# Patient Record
Sex: Female | Born: 1960 | Race: White | Hispanic: No | Marital: Married | Smoking: Former smoker
Health system: Southern US, Community
[De-identification: ages and names within clinical notes are randomized; demographics above are authoritative.]

## PROBLEM LIST (undated history)

## (undated) DIAGNOSIS — I10 Essential (primary) hypertension: Secondary | ICD-10-CM

## (undated) DIAGNOSIS — M4800 Spinal stenosis, site unspecified: Secondary | ICD-10-CM

## (undated) DIAGNOSIS — Z972 Presence of dental prosthetic device (complete) (partial): Secondary | ICD-10-CM

## (undated) DIAGNOSIS — M549 Dorsalgia, unspecified: Secondary | ICD-10-CM

## (undated) DIAGNOSIS — E039 Hypothyroidism, unspecified: Secondary | ICD-10-CM

## (undated) DIAGNOSIS — L409 Psoriasis, unspecified: Secondary | ICD-10-CM

## (undated) DIAGNOSIS — Z8489 Family history of other specified conditions: Secondary | ICD-10-CM

## (undated) DIAGNOSIS — E079 Disorder of thyroid, unspecified: Secondary | ICD-10-CM

## (undated) HISTORY — PX: VASCULAR SURGERY: SHX849

## (undated) HISTORY — DX: Dorsalgia, unspecified: M54.9

## (undated) HISTORY — DX: Disorder of thyroid, unspecified: E07.9

## (undated) HISTORY — DX: Essential (primary) hypertension: I10

## (undated) HISTORY — DX: Psoriasis, unspecified: L40.9

## (undated) HISTORY — DX: Spinal stenosis, site unspecified: M48.00

---

## 2004-08-31 ENCOUNTER — Ambulatory Visit: Payer: Self-pay | Admitting: Obstetrics and Gynecology

## 2005-05-15 HISTORY — PX: ABDOMINAL HYSTERECTOMY: SHX81

## 2005-09-28 ENCOUNTER — Ambulatory Visit: Payer: Self-pay | Admitting: Obstetrics and Gynecology

## 2006-03-26 ENCOUNTER — Ambulatory Visit: Payer: Self-pay | Admitting: Obstetrics and Gynecology

## 2006-10-02 ENCOUNTER — Ambulatory Visit: Payer: Self-pay | Admitting: Obstetrics and Gynecology

## 2006-10-15 ENCOUNTER — Ambulatory Visit: Payer: Self-pay | Admitting: Obstetrics and Gynecology

## 2007-10-02 ENCOUNTER — Ambulatory Visit: Payer: Self-pay | Admitting: Obstetrics and Gynecology

## 2007-12-02 ENCOUNTER — Ambulatory Visit: Payer: Self-pay | Admitting: Gastroenterology

## 2008-10-20 ENCOUNTER — Ambulatory Visit: Payer: Self-pay

## 2009-03-31 DIAGNOSIS — D229 Melanocytic nevi, unspecified: Secondary | ICD-10-CM

## 2009-03-31 HISTORY — DX: Melanocytic nevi, unspecified: D22.9

## 2010-07-23 LAB — HM PAP SMEAR: HM Pap smear: NORMAL

## 2011-02-15 ENCOUNTER — Ambulatory Visit: Payer: Self-pay | Admitting: Family Medicine

## 2011-02-21 ENCOUNTER — Ambulatory Visit: Payer: Self-pay | Admitting: Family Medicine

## 2012-02-20 ENCOUNTER — Ambulatory Visit: Payer: Self-pay | Admitting: Family Medicine

## 2012-03-18 ENCOUNTER — Encounter: Payer: 59 | Admitting: Family Medicine

## 2012-07-22 ENCOUNTER — Encounter: Payer: Self-pay | Admitting: Internal Medicine

## 2012-07-22 ENCOUNTER — Ambulatory Visit (INDEPENDENT_AMBULATORY_CARE_PROVIDER_SITE_OTHER): Payer: 59 | Admitting: Internal Medicine

## 2012-07-22 VITALS — BP 120/80 | HR 87 | Temp 98.1°F | Ht 65.0 in | Wt 202.0 lb

## 2012-07-22 DIAGNOSIS — Z6831 Body mass index (BMI) 31.0-31.9, adult: Secondary | ICD-10-CM | POA: Insufficient documentation

## 2012-07-22 DIAGNOSIS — E669 Obesity, unspecified: Secondary | ICD-10-CM

## 2012-07-22 DIAGNOSIS — E039 Hypothyroidism, unspecified: Secondary | ICD-10-CM

## 2012-07-22 NOTE — Progress Notes (Signed)
  Subjective:    Patient ID: [name], female    DOB: [birth year], 52 y.o.   MRN: [id number]  HPI 52 year old female with history of hypothyroidism presents to establish care. She reports she is generally feeling well. She notes some difficulty losing weight. However, she notes dietary indiscretion and has not currently following a specific exercise plan. She reports that she works long hours, typically 12 hours per day and has limited time for exercise. She is planning to start a walking program during her lunchtime at work. She denies any new concerns today.  Outpatient Encounter Prescriptions as of 07/22/2012  Medication Sig Dispense Refill  . cholecalciferol (VITAMIN D) 1000 UNITS tablet Take 1,000 Units by mouth daily.      . fish oil-omega-3 fatty acids 1000 MG capsule Take 2 g by mouth daily.      [name] levothyroxine (SYNTHROID, LEVOTHROID) 50 MCG tablet Take 50 mcg by mouth daily.       No facility-administered encounter medications on file as of 07/22/2012.   BP 120/80  Pulse 87  Temp(Src) 98.1 F (36.7 C) (Oral)  Ht 5\' 5"  (1.651 m)  Wt 202 lb (91.627 kg)  BMI 33.61 kg/m2  SpO2 97%  Review of Systems  Constitutional: Negative for fever, chills, appetite change, fatigue and unexpected weight change.  HENT: Negative for ear pain, congestion, sore throat, trouble swallowing, neck pain, voice change and sinus pressure.   Eyes: Negative for visual disturbance.  Respiratory: Negative for cough, shortness of breath, wheezing and stridor.   Cardiovascular: Negative for chest pain, palpitations and leg swelling.  Gastrointestinal: Negative for nausea, vomiting, abdominal pain, diarrhea, constipation, blood in stool, abdominal distention and anal bleeding.  Genitourinary: Negative for dysuria and flank pain.  Musculoskeletal: Negative for myalgias, arthralgias and gait problem.  Skin: Negative for color change and rash.  Neurological: Negative for dizziness and headaches.   Hematological: Negative for adenopathy. Does not bruise/bleed easily.  Psychiatric/Behavioral: Negative for suicidal ideas, sleep disturbance and dysphoric mood. The patient is not nervous/anxious.        Objective:   Physical Exam  Constitutional: She is oriented to person, place, and time. She appears well-developed and well-nourished. No distress.  HENT:  Head: Normocephalic and atraumatic.  Right Ear: External ear normal.  Left Ear: External ear normal.  Nose: Nose normal.  Mouth/Throat: Oropharynx is clear and moist. No oropharyngeal exudate.  Eyes: Conjunctivae are normal. Pupils are equal, round, and reactive to light. Right eye exhibits no discharge. Left eye exhibits no discharge. No scleral icterus.  Neck: Normal range of motion. Neck supple. No tracheal deviation present. No thyromegaly present.  Cardiovascular: Normal rate, regular rhythm, normal heart sounds and intact distal pulses.  Exam reveals no gallop and no friction rub.   No murmur heard. Pulmonary/Chest: Effort normal and breath sounds normal. No respiratory distress. She has no wheezes. She has no rales. She exhibits no tenderness.  Musculoskeletal: Normal range of motion. She exhibits no edema and no tenderness.  Lymphadenopathy:    She has no cervical adenopathy.  Neurological: She is alert and oriented to person, place, and time. No cranial nerve deficit. She exhibits normal muscle tone. Coordination normal.  Skin: Skin is warm and dry. No rash noted. She is not diaphoretic. No erythema. No pallor.  Psychiatric: She has a normal mood and affect. Her behavior is normal. Judgment and thought content normal.          Assessment & Plan:

## 2012-07-22 NOTE — Assessment & Plan Note (Signed)
Will check TSH today at [facility]. Continue Levothyroxine. Follow up 03/2013 for physical exam.

## 2012-07-22 NOTE — Assessment & Plan Note (Signed)
Body mass index is 33.61 kg/(m^2).  Encouraged pt to keep a food diary. Discussed increasing exercise with goal of 3 x per week. Will also check TSH with labs to make sure treatment of hypothyroidism adequate.

## 2012-07-24 ENCOUNTER — Telehealth: Payer: Self-pay | Admitting: Internal Medicine

## 2012-07-24 NOTE — Telephone Encounter (Signed)
Recent labs performed at [facility] including kidney and liver function and blood counts, thyroid function were normal. Cholesterol is slightly elevated with LDL 156. Would recommend diet low in saturated fat and high in fiber. Recommend 40 minutes of aerobic activity 3 times per week at a minimum.

## 2012-07-24 NOTE — Telephone Encounter (Signed)
LMTCB

## 2012-07-25 NOTE — Telephone Encounter (Signed)
LMTCB

## 2012-07-28 ENCOUNTER — Encounter: Payer: Self-pay | Admitting: Internal Medicine

## 2012-07-30 ENCOUNTER — Encounter: Payer: Self-pay | Admitting: *Deleted

## 2012-07-30 NOTE — Telephone Encounter (Signed)
Patient did not return call, letter mailed to home address on file.

## 2012-08-09 ENCOUNTER — Encounter: Payer: Self-pay | Admitting: Internal Medicine

## 2013-03-20 ENCOUNTER — Other Ambulatory Visit: Payer: Self-pay

## 2013-03-20 ENCOUNTER — Encounter: Payer: Self-pay | Admitting: Internal Medicine

## 2013-03-20 ENCOUNTER — Ambulatory Visit (INDEPENDENT_AMBULATORY_CARE_PROVIDER_SITE_OTHER): Payer: 59 | Admitting: Internal Medicine

## 2013-03-20 VITALS — BP 138/88 | HR 64 | Temp 98.1°F | Ht 64.75 in | Wt 204.0 lb

## 2013-03-20 DIAGNOSIS — M545 Low back pain: Secondary | ICD-10-CM

## 2013-03-20 DIAGNOSIS — R209 Unspecified disturbances of skin sensation: Secondary | ICD-10-CM

## 2013-03-20 DIAGNOSIS — R200 Anesthesia of skin: Secondary | ICD-10-CM | POA: Insufficient documentation

## 2013-03-20 DIAGNOSIS — Z23 Encounter for immunization: Secondary | ICD-10-CM | POA: Insufficient documentation

## 2013-03-20 DIAGNOSIS — L408 Other psoriasis: Secondary | ICD-10-CM

## 2013-03-20 DIAGNOSIS — G8929 Other chronic pain: Secondary | ICD-10-CM | POA: Insufficient documentation

## 2013-03-20 DIAGNOSIS — Z0000 Encounter for general adult medical examination without abnormal findings: Secondary | ICD-10-CM | POA: Insufficient documentation

## 2013-03-20 DIAGNOSIS — L409 Psoriasis, unspecified: Secondary | ICD-10-CM | POA: Insufficient documentation

## 2013-03-20 DIAGNOSIS — E669 Obesity, unspecified: Secondary | ICD-10-CM

## 2013-03-20 NOTE — Assessment & Plan Note (Signed)
Recent exacerbation of psoriasis over legs. Some persistent erythematous plaques noted on exam today. Continue Clobetasol prn and follow up with Dermatology.

## 2013-03-20 NOTE — Assessment & Plan Note (Signed)
Wt Readings from Last 3 Encounters:  03/20/13 204 lb (92.534 kg)  07/22/12 202 lb (91.627 kg)   Encouraged healthy diet and regular physical activity with goal of 3x week aerobic activity.

## 2013-03-20 NOTE — Assessment & Plan Note (Signed)
Chronic low back pain. Pt reports imaging in the past including plain films were normal. Exam is normal today. Recommended starting PT program. NSAIDS prn pain. If no improvement with this, then recommended setting up evaluation with Dr. Levi Aland in ortho and considering MRI Lumbar spine.

## 2013-03-20 NOTE — Assessment & Plan Note (Signed)
General medical exam including breast exam normal today. PAP and pelvic deferred as PAP was normal 2012. Plan repeat PAP in 2015. Mammogram ordered. Colonoscopy UTD. Flu vaccine UTD. Encouraged healthy diet and regular physical activity with goal of weight loss.

## 2013-03-20 NOTE — Assessment & Plan Note (Signed)
Bilateral hand numbness most consistent with carpal tunnel. Recommended that she start wearing bilateral wrist braces. Recommended improving ergonomics of her desk at work. If symptoms persistent, we discussed referral to neurology for EMG testing to confirm carpal tunnel.

## 2013-03-20 NOTE — Progress Notes (Signed)
Subjective:    Patient ID: [name], female    DOB: [birth year], 52 y.o.   MRN: [id number]  HPI 52 year old female presents for annual exam. She has several concerns today. First, she reports that over the last several weeks she has intermittent numbness in both of her hands. This most often occurs at night. It improves with moving her hands. It is described as mostly in her fingers. She does work at his desk on a computer all day. She has not taken any medication or tried any intervention for this.  She is also concerned about several year history of chronic low back pain. She reports aching pain in her lower back almost daily. The pain is exacerbated by increase physical activity. It sometimes radiates down the back of her legs, particularly her right leg. She takes Aleve almost daily with minimal improvement. In the past, she had plain films which were normal per her report. She has also done massage therapy without much improvement. She has not had weakness or instability in her legs. She denies numbness in her legs.  In regards to her history of psoriasis, she notes that she recently had a flare with patches on her legs. She was started on topical clobetasol by her dermatologist. she had some improvement with this.  In terms of health maintenance, she had Pap smear in 2012 which she reports was normal. She has no history of abnormal Paps. Mammogram is due. Colonoscopy is up-to-date. She has been trying to follow a healthy diet and get regular physical activity. She notes that her time for exercise is limited by work.  Outpatient Encounter Prescriptions as of 03/20/2013  Medication Sig  . cholecalciferol (VITAMIN D) 1000 UNITS tablet Take 1,000 Units by mouth daily.  . fish oil-omega-3 fatty acids 1000 MG capsule Take 2 g by mouth daily.  [name] levothyroxine (SYNTHROID, LEVOTHROID) 50 MCG tablet Take 50 mcg by mouth daily.   BP 138/88  Pulse 64  Temp(Src) 98.1 F (36.7 C) (Oral)  Ht 5'  4.75" (1.645 m)  Wt 204 lb (92.534 kg)  BMI 34.20 kg/m2  SpO2 98%  Review of Systems  Constitutional: Negative for fever, chills, appetite change, fatigue and unexpected weight change.  HENT: Negative for congestion, ear pain, sinus pressure, sore throat, trouble swallowing and voice change.   Eyes: Negative for visual disturbance.  Respiratory: Negative for cough, shortness of breath, wheezing and stridor.   Cardiovascular: Negative for chest pain, palpitations and leg swelling.  Gastrointestinal: Negative for nausea, vomiting, abdominal pain, diarrhea, constipation, blood in stool, abdominal distention and anal bleeding.  Genitourinary: Negative for dysuria and flank pain.  Musculoskeletal: Positive for arthralgias, back pain and myalgias. Negative for gait problem and neck pain.  Skin: Negative for color change and rash.  Neurological: Positive for numbness (bilateral hands). Negative for dizziness, tremors, seizures, syncope, speech difficulty, weakness and headaches.  Hematological: Negative for adenopathy. Does not bruise/bleed easily.  Psychiatric/Behavioral: Negative for suicidal ideas, sleep disturbance and dysphoric mood. The patient is not nervous/anxious.        Objective:   Physical Exam  Constitutional: She is oriented to person, place, and time. She appears well-developed and well-nourished. No distress.  HENT:  Head: Normocephalic and atraumatic.  Right Ear: External ear normal.  Left Ear: External ear normal.  Nose: Nose normal.  Mouth/Throat: Oropharynx is clear and moist. No oropharyngeal exudate.  Eyes: Conjunctivae are normal. Pupils are equal, round, and reactive to light. Right eye exhibits no discharge. Left  eye exhibits no discharge. No scleral icterus.  Neck: Normal range of motion. Neck supple. No tracheal deviation present. No thyromegaly present.  Cardiovascular: Normal rate, regular rhythm, normal heart sounds and intact distal pulses.  Exam reveals no  gallop and no friction rub.   No murmur heard. Pulmonary/Chest: Effort normal and breath sounds normal. No accessory muscle usage. Not tachypneic. No respiratory distress. She has no decreased breath sounds. She has no wheezes. She has no rhonchi. She has no rales. She exhibits no tenderness. Right breast exhibits no inverted nipple, no mass, no nipple discharge, no skin change and no tenderness. Left breast exhibits no inverted nipple, no mass, no nipple discharge, no skin change and no tenderness. Breasts are symmetrical.  Abdominal: Soft. Bowel sounds are normal. She exhibits no distension and no mass. There is no tenderness. There is no rebound and no guarding.  Musculoskeletal: Normal range of motion. She exhibits no edema and no tenderness.       Lumbar back: She exhibits pain. She exhibits normal range of motion, no tenderness, no bony tenderness, no edema and no deformity.  Lymphadenopathy:    She has no cervical adenopathy.  Neurological: She is alert and oriented to person, place, and time. No cranial nerve deficit. She exhibits normal muscle tone. Coordination normal.  Skin: Skin is warm and dry. Rash noted. Rash is papular (erythematous plaques over bilateral LE consistent with known psoriasis). She is not diaphoretic. No erythema. No pallor.  Psychiatric: She has a normal mood and affect. Her behavior is normal. Judgment and thought content normal.          Assessment & Plan:

## 2013-03-20 NOTE — Progress Notes (Signed)
Pre-visit discussion using our clinic review tool. No additional management support is needed unless otherwise documented below in the visit note.  

## 2013-03-21 ENCOUNTER — Ambulatory Visit: Payer: Self-pay | Admitting: Internal Medicine

## 2013-03-28 ENCOUNTER — Other Ambulatory Visit: Payer: Self-pay | Admitting: Internal Medicine

## 2013-03-29 LAB — CBC WITH DIFFERENTIAL
Basos: 0 %
Eos: 3 %
Hemoglobin: 13.2 g/dL (ref 11.1–15.9)
Immature Grans (Abs): 0 10*3/uL (ref 0.0–0.1)
MCH: 31.8 pg (ref 26.6–33.0)
Monocytes Absolute: 0.6 10*3/uL (ref 0.1–0.9)
Monocytes: 10 %
Neutrophils Relative %: 51 %
RBC: 4.15 x10E6/uL (ref 3.77–5.28)

## 2013-03-29 LAB — COMPREHENSIVE METABOLIC PANEL
ALT: 17 IU/L (ref 0–32)
AST: 15 IU/L (ref 0–40)
Albumin/Globulin Ratio: 1.8 (ref 1.1–2.5)
Alkaline Phosphatase: 75 IU/L (ref 39–117)
BUN/Creatinine Ratio: 13 (ref 9–23)
GFR calc Af Amer: 90 mL/min/{1.73_m2} (ref >59)
GFR calc non Af Amer: 78 mL/min/{1.73_m2} (ref >59)
Potassium: 4.4 mmol/L (ref 3.5–5.2)
Sodium: 138 mmol/L (ref 134–144)
Total Bilirubin: 0.4 mg/dL (ref 0.0–1.2)

## 2013-03-29 LAB — VITAMIN D 25 HYDROXY (VIT D DEFICIENCY, FRACTURES): Vit D, 25-Hydroxy: 26.9 ng/mL — ABNORMAL LOW (ref 30.0–100.0)

## 2013-03-29 LAB — HGB A1C W/O EAG: Hgb A1c MFr Bld: 5.5 % (ref 4.8–5.6)

## 2013-04-03 ENCOUNTER — Encounter: Payer: Self-pay | Admitting: Internal Medicine

## 2013-04-06 ENCOUNTER — Encounter: Payer: Self-pay | Admitting: Internal Medicine

## 2013-04-21 ENCOUNTER — Ambulatory Visit: Payer: 59 | Admitting: Internal Medicine

## 2013-04-24 ENCOUNTER — Encounter: Payer: Self-pay | Admitting: Internal Medicine

## 2013-05-15 ENCOUNTER — Other Ambulatory Visit: Payer: Self-pay | Admitting: Family Medicine

## 2013-05-16 ENCOUNTER — Other Ambulatory Visit: Payer: Self-pay | Admitting: Internal Medicine

## 2013-06-18 ENCOUNTER — Encounter: Payer: Self-pay | Admitting: Internal Medicine

## 2013-11-06 ENCOUNTER — Ambulatory Visit: Payer: 59 | Admitting: Internal Medicine

## 2013-11-12 ENCOUNTER — Ambulatory Visit: Payer: 59 | Admitting: Internal Medicine

## 2013-11-20 ENCOUNTER — Ambulatory Visit: Payer: 59 | Admitting: Internal Medicine

## 2013-11-24 ENCOUNTER — Encounter: Payer: Self-pay | Admitting: Internal Medicine

## 2013-11-24 ENCOUNTER — Other Ambulatory Visit: Payer: Self-pay | Admitting: Internal Medicine

## 2013-11-24 ENCOUNTER — Ambulatory Visit (INDEPENDENT_AMBULATORY_CARE_PROVIDER_SITE_OTHER): Payer: 59 | Admitting: Internal Medicine

## 2013-11-24 VITALS — BP 136/76 | HR 67 | Temp 98.2°F | Ht 64.75 in | Wt 217.2 lb

## 2013-11-24 DIAGNOSIS — M7710 Lateral epicondylitis, unspecified elbow: Secondary | ICD-10-CM | POA: Insufficient documentation

## 2013-11-24 DIAGNOSIS — M7712 Lateral epicondylitis, left elbow: Secondary | ICD-10-CM

## 2013-11-24 DIAGNOSIS — R609 Edema, unspecified: Secondary | ICD-10-CM

## 2013-11-24 DIAGNOSIS — R600 Localized edema: Secondary | ICD-10-CM

## 2013-11-24 MED ORDER — HYDROCHLOROTHIAZIDE 12.5 MG PO CAPS: 12.5000 mg | ORAL_CAPSULE | Freq: Every day | ORAL | Status: DC | PRN

## 2013-11-24 NOTE — Progress Notes (Signed)
Subjective:    Patient ID: [name], female    DOB: 16-Oct-1960, 54 y.o.   MRN: [id number]  HPI 52YO female presents for acute visit.  Leg swelling - Increased LE edema last few weeks. Does not seem to improve at night. Trying to keep legs elevated with no improvement. Has limited salt intake. Following Weight Watcher diet. Has some dyspnea with exertion such as walking a flight of stairs, however does not generally exercise. No chest pain, palpitations. No other symptoms.  Also concerned about several weeks of tenderness and pain over left lateral elbow. Pain made worse by movement. Recently started a drumming aerobics class.  Review of Systems  Constitutional: Negative for fever, chills, appetite change, fatigue and unexpected weight change.  Eyes: Negative for visual disturbance.  Respiratory: Positive for shortness of breath.   Cardiovascular: Positive for leg swelling. Negative for chest pain.  Gastrointestinal: Negative for abdominal pain.  Skin: Negative for color change and rash.  Hematological: Negative for adenopathy. Does not bruise/bleed easily.  Psychiatric/Behavioral: Negative for dysphoric mood. The patient is not nervous/anxious.        Objective:    BP 136/76  Pulse 67  Temp(Src) 98.2 F (36.8 C) (Oral)  Ht 5' 4.75" (1.645 m)  Wt 217 lb 4 oz (98.544 kg)  BMI 36.42 kg/m2  SpO2 97% Physical Exam  Constitutional: She is oriented to person, place, and time. She appears well-developed and well-nourished. No distress.  HENT:  Head: Normocephalic and atraumatic.  Right Ear: External ear normal.  Left Ear: External ear normal.  Nose: Nose normal.  Mouth/Throat: Oropharynx is clear and moist. No oropharyngeal exudate.  Eyes: Conjunctivae are normal. Pupils are equal, round, and reactive to light. Right eye exhibits no discharge. Left eye exhibits no discharge. No scleral icterus.  Neck: Normal range of motion. Neck supple. No tracheal deviation present. No  thyromegaly present.  Cardiovascular: Normal rate, regular rhythm, normal heart sounds and intact distal pulses.  Exam reveals no gallop and no friction rub.   No murmur heard. Pulmonary/Chest: Effort normal and breath sounds normal. No accessory muscle usage. Not tachypneic. No respiratory distress. She has no decreased breath sounds. She has no wheezes. She has no rhonchi. She has no rales. She exhibits no tenderness.  Musculoskeletal: Normal range of motion. She exhibits edema (pitting to upper shin).       Left elbow: She exhibits no swelling. Tenderness found. Lateral epicondyle tenderness noted.       Arms: Lymphadenopathy:    She has no cervical adenopathy.  Neurological: She is alert and oriented to person, place, and time. No cranial nerve deficit. She exhibits normal muscle tone. Coordination normal.  Skin: Skin is warm and dry. No rash noted. She is not diaphoretic. No erythema. No pallor.  Psychiatric: She has a normal mood and affect. Her behavior is normal. Judgment and thought content normal.          Assessment & Plan:   Problem List Items Addressed This Visit     Unprioritized   Bilateral edema of lower extremity - Primary     Symptoms most consistent with venous insufficiency. Recommended keeping legs elevated, limiting salt intake. Compression stockings. Will start HCTZ 12.5mg  daily. Discussed potential risks of this medication. Will check renal function with labs today.    Relevant Orders      POCT Urinalysis Dipstick   Lateral epicondylitis (tennis elbow)     Recommended icing her elbow and using a sling for the  next few days. Ibuprofen 800mg  po tid prn. Discussed NTG patch and possible referral to sports med, but will hold off for now.        Return in about 4 weeks (around 12/22/2013) for Recheck.

## 2013-11-24 NOTE — Progress Notes (Signed)
Pre visit review using our clinic review tool, if applicable. No additional management support is needed unless otherwise documented below in the visit note. 

## 2013-11-24 NOTE — Patient Instructions (Addendum)
We will check labs today.  Start HCTZ 12.5mg  daily.  Consider using Mediven compression stockings.      Lateral Epicondylitis (Tennis Elbow) with Rehab Lateral epicondylitis involves inflammation and pain around the outer portion of the elbow. The pain is caused by inflammation of the tendons in the forearm that bring back (extend) the wrist. Lateral epicondylittis is also called tennis elbow, because it is very common in tennis players. However, it may occur in any individual who extends the wrist repetitively. If lateral epicondylitis is left untreated, it may become a chronic problem. SYMPTOMS   Pain, tenderness, and inflammation on the outer (lateral) side of the elbow.  Pain or weakness with gripping activities.  Pain that increases with wrist twisting motions (playing tennis, using a screwdriver, opening a door or a jar).  Pain with lifting objects, including a coffee cup. CAUSES  Lateral epicondylitis is caused by inflammation of the tendons that extend the wrist. Causes of injury may include:  Repetitive stress and strain on the muscles and tendons that extend the wrist.  Sudden change in activity level or intensity.  Incorrect grip in racquet sports.  Incorrect grip size of racquet (often too large).  Incorrect hitting position or technique (usually backhand, leading with the elbow).  Using a racket that is too heavy. RISK INCREASES WITH:  Sports or occupations that require repetitive and/or strenuous forearm and wrist movements (tennis, squash, racquetball, carpentry).  Poor wrist and forearm strength and flexibility.  Failure to warm up properly before activity.  Resuming activity before healing, rehabilitation, and conditioning are complete. PREVENTION   Warm up and stretch properly before activity.  Maintain physical fitness:  Strength, flexibility, and endurance.  Cardiovascular fitness.  Wear and use properly fitted equipment.  Learn and use  proper technique and have a coach correct improper technique.  Wear a tennis elbow (counterforce) brace. PROGNOSIS  The course of this condition depends on the degree of the injury. If treated properly, acute cases (symptoms lasting less than 4 weeks) are often resolved in 2 to 6 weeks. Chronic (longer lasting cases) often resolve in 3 to 6 months, but may require physical therapy. RELATED COMPLICATIONS   Frequently recurring symptoms, resulting in a chronic problem. Properly treating the problem the first time decreases frequency of recurrence.  Chronic inflammation, scarring tendon degeneration, and partial tendon tear, requiring surgery.  Delayed healing or resolution of symptoms. TREATMENT  Treatment first involves the use of ice and medicine, to reduce pain and inflammation. Strengthening and stretching exercises may help reduce discomfort, if performed regularly. These exercises may be performed at home, if the condition is an acute injury. Chronic cases may require a referral to a physical therapist for evaluation and treatment. Your caregiver may advise a corticosteroid injection, to help reduce inflammation. Rarely, surgery is needed. MEDICATION  If pain medicine is needed, nonsteroidal anti-inflammatory medicines (aspirin and ibuprofen), or other minor pain relievers (acetaminophen), are often advised.  Do not take pain medicine for 7 days before surgery.  Prescription pain relievers may be given, if your caregiver thinks they are needed. Use only as directed and only as much as you need.  Corticosteroid injections may be recommended. These injections should be reserved only for the most severe cases, because they can only be given a certain number of times. HEAT AND COLD  Cold treatment (icing) should be applied for 10 to 15 minutes every 2 to 3 hours for inflammation and pain, and immediately after activity that aggravates your symptoms. Use  ice packs or an ice massage.  Heat  treatment may be used before performing stretching and strengthening activities prescribed by your caregiver, physical therapist, or athletic trainer. Use a heat pack or a warm water soak. SEEK MEDICAL CARE IF: Symptoms get worse or do not improve in 2 weeks, despite treatment. EXERCISES  RANGE OF MOTION (ROM) AND STRETCHING EXERCISES - Epicondylitis, Lateral (Tennis Elbow) These exercises may help you when beginning to rehabilitate your injury. Your symptoms may go away with or without further involvement from your physician, physical therapist or athletic trainer. While completing these exercises, remember:   Restoring tissue flexibility helps normal motion to return to the joints. This allows healthier, less painful movement and activity.  An effective stretch should be held for at least 30 seconds.  A stretch should never be painful. You should only feel a gentle lengthening or release in the stretched tissue. RANGE OF MOTION - Wrist Flexion, Active-Assisted  Extend your right / left elbow with your fingers pointing down.*  Gently pull the back of your hand towards you, until you feel a gentle stretch on the top of your forearm.  Hold this position for __________ seconds. Repeat __________ times. Complete this exercise __________ times per day.  *If directed by your physician, physical therapist or athletic trainer, complete this stretch with your elbow bent, rather than extended. RANGE OF MOTION - Wrist Extension, Active-Assisted  Extend your right / left elbow and turn your palm upwards.*  Gently pull your palm and fingertips back, so your wrist extends and your fingers point more toward the ground.  You should feel a gentle stretch on the inside of your forearm.  Hold this position for __________ seconds. Repeat __________ times. Complete this exercise __________ times per day. *If directed by your physician, physical therapist or athletic trainer, complete this stretch with  your elbow bent, rather than extended. STRETCH - Wrist Flexion  Place the back of your right / left hand on a tabletop, leaving your elbow slightly bent. Your fingers should point away from your body.  Gently press the back of your hand down onto the table by straightening your elbow. You should feel a stretch on the top of your forearm.  Hold this position for __________ seconds. Repeat __________ times. Complete this stretch __________ times per day.  STRETCH - Wrist Extension   Place your right / left fingertips on a tabletop, leaving your elbow slightly bent. Your fingers should point backwards.  Gently press your fingers and palm down onto the table by straightening your elbow. You should feel a stretch on the inside of your forearm.  Hold this position for __________ seconds. Repeat __________ times. Complete this stretch __________ times per day.  STRENGTHENING EXERCISES - Epicondylitis, Lateral (Tennis Elbow) These exercises may help you when beginning to rehabilitate your injury. They may resolve your symptoms with or without further involvement from your physician, physical therapist or athletic trainer. While completing these exercises, remember:   Muscles can gain both the endurance and the strength needed for everyday activities through controlled exercises.  Complete these exercises as instructed by your physician, physical therapist or athletic trainer. Increase the resistance and repetitions only as guided.  You may experience muscle soreness or fatigue, but the pain or discomfort you are trying to eliminate should never worsen during these exercises. If this pain does get worse, stop and make sure you are following the directions exactly. If the pain is still present after adjustments, discontinue the exercise until  you can discuss the trouble with your caregiver. STRENGTH - Wrist Flexors  Sit with your right / left forearm palm-up and fully supported on a table or  countertop. Your elbow should be resting below the height of your shoulder. Allow your wrist to extend over the edge of the surface.  Loosely holding a __________ weight, or a piece of rubber exercise band or tubing, slowly curl your hand up toward your forearm.  Hold this position for __________ seconds. Slowly lower the wrist back to the starting position in a controlled manner. Repeat __________ times. Complete this exercise __________ times per day.  STRENGTH - Wrist Extensors  Sit with your right / left forearm palm-down and fully supported on a table or countertop. Your elbow should be resting below the height of your shoulder. Allow your wrist to extend over the edge of the surface.  Loosely holding a __________ weight, or a piece of rubber exercise band or tubing, slowly curl your hand up toward your forearm.  Hold this position for __________ seconds. Slowly lower the wrist back to the starting position in a controlled manner. Repeat __________ times. Complete this exercise __________ times per day.  STRENGTH - Ulnar Deviators  Stand with a ____________________ weight in your right / left hand, or sit while holding a rubber exercise band or tubing, with your healthy arm supported on a table or countertop.  Move your wrist, so that your pinkie travels toward your forearm and your thumb moves away from your forearm.  Hold this position for __________ seconds and then slowly lower the wrist back to the starting position. Repeat __________ times. Complete this exercise __________ times per day STRENGTH - Radial Deviators  Stand with a ____________________ weight in your right / left hand, or sit while holding a rubber exercise band or tubing, with your injured arm supported on a table or countertop.  Raise your hand upward in front of you or pull up on the rubber tubing.  Hold this position for __________ seconds and then slowly lower the wrist back to the starting position. Repeat  __________ times. Complete this exercise __________ times per day. STRENGTH - Forearm Supinators   Sit with your right / left forearm supported on a table, keeping your elbow below shoulder height. Rest your hand over the edge, palm down.  Gently grip a hammer or a soup ladle.  Without moving your elbow, slowly turn your palm and hand upward to a "thumbs-up" position.  Hold this position for __________ seconds. Slowly return to the starting position. Repeat __________ times. Complete this exercise __________ times per day.  STRENGTH - Forearm Pronators   Sit with your right / left forearm supported on a table, keeping your elbow below shoulder height. Rest your hand over the edge, palm up.  Gently grip a hammer or a soup ladle.  Without moving your elbow, slowly turn your palm and hand upward to a "thumbs-up" position.  Hold this position for __________ seconds. Slowly return to the starting position. Repeat __________ times. Complete this exercise __________ times per day.  STRENGTH - Grip  Grasp a tennis ball, a dense sponge, or a large, rolled sock in your hand.  Squeeze as hard as you can, without increasing any pain.  Hold this position for __________ seconds. Release your grip slowly. Repeat __________ times. Complete this exercise __________ times per day.  STRENGTH - Elbow Extensors, Isometric  Stand or sit upright, on a firm surface. Place your right / left arm  so that your palm faces your stomach, and it is at the height of your waist.  Place your opposite hand on the underside of your forearm. Gently push up as your right / left arm resists. Push as hard as you can with both arms, without causing any pain or movement at your right / left elbow. Hold this stationary position for __________ seconds. Gradually release the tension in both arms. Allow your muscles to relax completely before repeating. Document Released: 05/01/2005 Document Revised: 07/24/2011 Document  Reviewed: 08/13/2008 [facility] Patient Information 2015 [employer], [place]. This information is not intended to replace advice given to you by your health care provider. Make sure you discuss any questions you have with your health care provider.  Follow up in 4 weeks.

## 2013-11-24 NOTE — Assessment & Plan Note (Signed)
Recommended icing her elbow and using a sling for the next few days. Ibuprofen 800mg  po tid prn. Discussed NTG patch and possible referral to sports med, but will hold off for now.

## 2013-11-24 NOTE — Assessment & Plan Note (Signed)
Symptoms most consistent with venous insufficiency. Recommended keeping legs elevated, limiting salt intake. Compression stockings. Will start HCTZ 12.5mg  daily. Discussed potential risks of this medication. Will check renal function with labs today.

## 2013-11-25 ENCOUNTER — Other Ambulatory Visit (INDEPENDENT_AMBULATORY_CARE_PROVIDER_SITE_OTHER): Payer: 59

## 2013-11-25 DIAGNOSIS — N390 Urinary tract infection, site not specified: Secondary | ICD-10-CM

## 2013-11-25 LAB — BASIC METABOLIC PANEL
BUN: 9 mg/dL (ref 4–21)
Creatinine: 0.8 mg/dL (ref <=1.1)
Glucose: 85 mg/dL
POTASSIUM: 4.2 mmol/L (ref 3.4–5.3)
Sodium: 140 mmol/L (ref 137–147)

## 2013-11-25 LAB — POCT URINALYSIS DIPSTICK
BILIRUBIN UA: NEGATIVE
Glucose, UA: NEGATIVE
KETONES UA: NEGATIVE
Leukocytes, UA: NEGATIVE
Nitrite, UA: NEGATIVE
PH UA: 6
PROTEIN UA: NEGATIVE
RBC UA: NEGATIVE
Spec Grav, UA: 1.02
Urobilinogen, UA: 0.2

## 2013-11-25 LAB — HEPATIC FUNCTION PANEL
ALK PHOS: 74 U/L (ref 25–125)
ALT: 15 U/L (ref 7–35)
AST: 14 U/L (ref 13–35)
Bilirubin, Total: 0.3 mg/dL

## 2013-11-25 LAB — TSH: TSH: 2.92 u[IU]/mL (ref <=5.90)

## 2013-12-08 ENCOUNTER — Other Ambulatory Visit: Payer: Self-pay | Admitting: *Deleted

## 2013-12-16 ENCOUNTER — Encounter: Payer: Self-pay | Admitting: Internal Medicine

## 2014-01-09 ENCOUNTER — Ambulatory Visit: Payer: 59 | Admitting: Internal Medicine

## 2015-03-20 ENCOUNTER — Ambulatory Visit
Admission: EM | Admit: 2015-03-20 | Discharge: 2015-03-20 | Disposition: A | Discharge status: Home / Self Care | Payer: 59 | Attending: Family Medicine | Admitting: Family Medicine

## 2015-03-20 ENCOUNTER — Encounter: Payer: Self-pay | Admitting: Emergency Medicine

## 2015-03-20 DIAGNOSIS — J988 Other specified respiratory disorders: Secondary | ICD-10-CM

## 2015-03-20 MED ORDER — HYDROCODONE-HOMATROPINE 5-1.5 MG/5ML PO SYRP: 5.0000 mL | ORAL_SOLUTION | Freq: Four times a day (QID) | ORAL | Status: DC | PRN

## 2015-03-20 MED ORDER — DOXYCYCLINE HYCLATE 100 MG PO CAPS: 100.0000 mg | ORAL_CAPSULE | Freq: Two times a day (BID) | ORAL | Status: DC

## 2015-03-20 NOTE — ED Notes (Signed)
Patient c/o cough, sinus pressure and HAs for a week.  Patient denies fevers.

## 2015-03-20 NOTE — Discharge Instructions (Signed)
Take antibiotic and use a cough syrup as needed.  Call your primary if you fail to improve or worsen.  Take care  Dr. Lacinda Axon

## 2015-03-20 NOTE — ED Provider Notes (Signed)
CSN: [id number]     Arrival date & time 03/20/15  1314 History   First MD Initiated Contact with Patient 03/20/15 1317     Chief Complaint  Patient presents with  . Facial Pain  . Headache  . Cough   (Consider location/radiation/quality/duration/timing/severity/associated sxs/prior Treatment) HPI  54 year old female with a past history of hypothyroidism and psoriasis presents to urgent care with complaints of facial pain/sinus headache, cough, and otalgia.  She reports that she's had the above symptoms for approximately 2 weeks. The cough is most troublesome symptom. She has had known sick contacts as her husband has been ill as well. She's been using some over-the-counter medications with little relief. No exacerbating factors. She denies any associated fever, chills, shortness of breath.  Past Medical History  Diagnosis Date  . Thyroid disease   . Back pain   . Psoriasis     [name]   Past Surgical History  Procedure Laterality Date  . Abdominal hysterectomy  2007    endometriosis, [name]   Family History  Problem Relation Age of Onset  . Heart disease Father   . Diabetes Paternal Uncle    Social History  Substance Use Topics  . Smoking status: Never Smoker   . Smokeless tobacco: Never Used  . Alcohol Use: No   OB History    No data available     Review of Systems  Constitutional: Negative for fever and chills.  HENT: Positive for congestion, ear pain and voice change.   Respiratory: Positive for cough. Negative for shortness of breath.   All other systems reviewed and are negative.  Allergies  Review of patient's allergies indicates no known allergies.  Home Medications   Prior to Admission medications   Medication Sig Start Date End Date Taking? Authorizing Provider  cholecalciferol (VITAMIN D) 1000 UNITS tablet Take 1,000 Units by mouth daily.    Historical Provider, MD  doxycycline (VIBRAMYCIN) 100 MG capsule Take 1 capsule (100 mg total) by  mouth 2 (two) times daily. 03/20/15   Coral Spikes, DO  fish oil-omega-3 fatty acids 1000 MG capsule Take 2 g by mouth daily.    Historical Provider, MD  FLUoxetine (PROZAC) 20 MG capsule Take 20 mg by mouth daily.    Historical Provider, MD  hydrochlorothiazide (MICROZIDE) 12.5 MG capsule TAKE ONE CAPSULE BY MOUTH EVERY DAY 11/24/13   Jackolyn Confer, MD  HYDROcodone-homatropine [facility]) 5-1.5 MG/5ML syrup Take 5 mLs by mouth every 6 (six) hours as needed for cough. 03/20/15   Coral Spikes, DO  levothyroxine (SYNTHROID, LEVOTHROID) 50 MCG tablet TAKE 1 TABLET BY MOUTH EVERY DAY ON AN EMPTY STOMACH 05/16/13   Jackolyn Confer, MD   Meds Ordered and Administered this Visit  Medications - No data to display  BP 133/78 mmHg  Pulse 73  Temp(Src) 97.7 F (36.5 C) (Tympanic)  Resp 16  Ht 5\' 4"  (1.626 m)  Wt 177 lb (80.287 kg)  BMI 30.37 kg/m2  SpO2 100% No data found.  Physical Exam  Constitutional: She appears well-developed. No distress.  HENT:  Head: Normocephalic and atraumatic.  Normal TMs bilaterally.  Neck: Neck supple.  Cardiovascular: Normal rate and regular rhythm.   No murmur heard. Pulmonary/Chest: Effort normal. No respiratory distress. She has no wheezes. She has no rales.  Abdominal: Soft. She exhibits no distension. There is no tenderness.  Lymphadenopathy:    She has no cervical adenopathy.  Neurological: She is alert.  Skin:  Psoriasis noted on  the lower extremities.  Psychiatric: She has a normal mood and affect.  Vitals reviewed.   ED Course  Procedures (including critical care time)  Labs Review Labs Reviewed - No data to display  Imaging Review No results found.  MDM   1. Respiratory infection    54 year old female with past medical history hypothyroidism presents with acute respiratory infection. Given duration of illness treating with doxycycline and Hycodan. Patient to follow PCP if she fails to improve or worsens.    Coral Spikes,  DO 03/20/15 1404

## 2016-02-16 ENCOUNTER — Encounter: Payer: Self-pay | Admitting: *Deleted

## 2016-03-02 ENCOUNTER — Other Ambulatory Visit: Payer: Self-pay

## 2016-03-02 ENCOUNTER — Ambulatory Visit (INDEPENDENT_AMBULATORY_CARE_PROVIDER_SITE_OTHER): Payer: 59 | Admitting: General Surgery

## 2016-03-02 ENCOUNTER — Encounter: Payer: Self-pay | Admitting: General Surgery

## 2016-03-02 VITALS — BP 144/80 | HR 74 | Resp 12 | Ht 65.0 in | Wt 166.0 lb

## 2016-03-02 DIAGNOSIS — I8392 Asymptomatic varicose veins of left lower extremity: Secondary | ICD-10-CM | POA: Diagnosis not present

## 2016-03-02 DIAGNOSIS — I87322 Chronic venous hypertension (idiopathic) with inflammation of left lower extremity: Secondary | ICD-10-CM | POA: Diagnosis not present

## 2016-03-02 NOTE — Patient Instructions (Signed)
The patient is aware to call back for any questions or concerns.  

## 2016-03-02 NOTE — Progress Notes (Signed)
Patient ID: [name], female   DOB: [birth year], 55 y.o.   MRN: [id number]  Chief Complaint  Patient presents with  . Varicose Veins    HPI [name] is a 55 y.o. female.  Her today for evaluation of possible varicose veins. 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 She states she is having left lower leg pain for 2 weeks. Pain is is in lower leg on the inside . She has had left lower leg swelling for 8 months. She is wearing compression hose for 5 days. She thinks the hose are helping. She does admit to a large vein in the left upper thigh. Denies blood clots. Ultrasound was 02-09-16 at Dr Humphrey Rolls office.  HPI  Past Medical History:  Diagnosis Date  . Back pain    spinal stenosis  . Psoriasis    [name]  . Thyroid disease     Past Surgical History:  Procedure Laterality Date  . ABDOMINAL HYSTERECTOMY  2007   endometriosis, [name]    Family History  Problem Relation Age of Onset  . Heart disease Father   . Diabetes Paternal Uncle     Social History Social History  Substance Use Topics  . Smoking status: Never Smoker  . Smokeless tobacco: Never Used  . Alcohol use No    No Known Allergies  Current Outpatient Prescriptions  Medication Sig Dispense Refill  . aspirin EC 81 MG tablet Take 81 mg by mouth daily.    [name] (EUCRISA) 2 % OINT Apply topically 2 (two) times daily.    [name] Fe Cbn-Fe Gluc-FA-B12-C-DSS (FERRALET 90 PO) Take by mouth daily.    . fish oil-omega-3 fatty acids 1000 MG capsule Take 2 g by mouth daily.    [name] levothyroxine (SYNTHROID, LEVOTHROID) 75 MCG tablet daily.  3  . Probiotic Product (TRUBIOTICS PO) Take by mouth daily.    [name] rOPINIRole (REQUIP) 0.5 MG tablet daily.  3  . Wheat Dextrin (BENEFIBER) POWD Take by mouth 2 (two) times daily.     No current facility-administered medications for this visit.     Review of Systems Review of Systems  Constitutional: Negative.   Respiratory: Negative.    Cardiovascular: Negative.     Blood pressure (!) 144/80, pulse 74, resp. rate 12, height 5\' 5"  (1.651 m), weight 166 lb (75.3 kg).  Physical Exam Physical Exam  Constitutional: She is oriented to person, place, and time. She appears well-developed and well-nourished.  HENT:  Mouth/Throat: Oropharynx is clear and moist.  Eyes: Conjunctivae are normal. No scleral icterus.  Neck: Neck supple.  Cardiovascular: Normal rate, regular rhythm and normal heart sounds.   Pulses:      Femoral pulses are 2+ on the right side, and 2+ on the left side.      Dorsalis pedis pulses are 2+ on the right side, and 2+ on the left side.       Posterior tibial pulses are 2+ on the right side, and 2+ on the left side.  Bilateral edema, left more than right. No stasis changes on right. VV extending from postero medial calf to upper thigh  on left side. Deep skin pigmentation, skin induration with mared tenderness left leg lower 1/3 medially  Pulmonary/Chest: Effort normal and breath sounds normal.  Abdominal: Soft. Normal appearance and bowel sounds are normal. There is no tenderness.  Lymphadenopathy:    She has no cervical adenopathy.  Neurological: She is alert and oriented to person, place, and  time.  Skin: Skin is warm and dry.  Psychiatric: Her behavior is normal.    Data Reviewed Prior ultrasounds- NO DVT noted.  Duplex scan on left leg was repeated today. There marked abnormal reflux in left GSV-lateral branch, at more than 3 secs. Assessment    VV left leg. Bilateral stasis changes, left side with impending ulceration. Findings discussed fully with pt. Una boot to left leg-pt agreeable.  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 She is currently using Ibuprofen- she can continue this prn    Plan        Recommend unna boot for 3 weeks. Follow up 3 weeks.   This information has been  scribed by Karie Fetch RN, BSN,BC.    SANKAR,SEEPLAPUTHUR G 03/02/2016, 3:01 PM

## 2016-03-08 ENCOUNTER — Ambulatory Visit (INDEPENDENT_AMBULATORY_CARE_PROVIDER_SITE_OTHER): Payer: 59 | Admitting: *Deleted

## 2016-03-08 DIAGNOSIS — I87322 Chronic venous hypertension (idiopathic) with inflammation of left lower extremity: Secondary | ICD-10-CM | POA: Diagnosis not present

## 2016-03-08 DIAGNOSIS — I8392 Asymptomatic varicose veins of left lower extremity: Secondary | ICD-10-CM

## 2016-03-08 NOTE — Progress Notes (Signed)
Patient ID: [name], female   DOB: [birth year], 55 y.o.   MRN: [id number]  The patient came in today for unna boot dressing change.  Left leg was washed with soap and water.  Unna boots, kerlix and coban applied.  Edema improving.The area of concern is improving. Psorasis irritation present around the ankle. Follow up as scheduled.

## 2016-03-08 NOTE — Patient Instructions (Signed)
The patient is aware to call back for any questions or concerns.  

## 2016-03-16 ENCOUNTER — Ambulatory Visit (INDEPENDENT_AMBULATORY_CARE_PROVIDER_SITE_OTHER): Payer: 59

## 2016-03-16 ENCOUNTER — Encounter: Payer: Self-pay | Admitting: *Deleted

## 2016-03-16 ENCOUNTER — Ambulatory Visit: Payer: 59

## 2016-03-16 DIAGNOSIS — I8392 Asymptomatic varicose veins of left lower extremity: Secondary | ICD-10-CM

## 2016-03-16 NOTE — Patient Instructions (Signed)
Use prescribed cream to psoriasis. Use Bag Balm or Udder cream on left leg. Follow up as scheduled.

## 2016-03-16 NOTE — Progress Notes (Signed)
Patient ID: [name], female   DOB: [birth year], 55 y.o.   MRN: [id number] The patient came in today for unna boot dressing change.  Left leg was washed with soap and water. Slight edema present in left foot. Psoriasis on left ankle very irritated. Per Dr Jamal Collin will not place [employer]. Follow up as scheduled. Patient advised to use prescribed cream on psoriasis. Patient advised to use Bag Balm or Udder Cream on left leg.

## 2016-03-22 ENCOUNTER — Encounter: Payer: Self-pay | Admitting: General Surgery

## 2016-03-22 ENCOUNTER — Ambulatory Visit (INDEPENDENT_AMBULATORY_CARE_PROVIDER_SITE_OTHER): Payer: 59 | Admitting: General Surgery

## 2016-03-22 VITALS — BP 132/74 | HR 72 | Resp 12 | Ht 66.0 in | Wt 162.0 lb

## 2016-03-22 DIAGNOSIS — I8392 Asymptomatic varicose veins of left lower extremity: Secondary | ICD-10-CM

## 2016-03-22 NOTE — Progress Notes (Signed)
Patient ID: [name], female   DOB: 04/25/1961, 55 y.o.   MRN: [id number]  Chief Complaint  Patient presents with  . Varicose Veins    HPI [name] is a 55 y.o. female here today for her follow up left leg swelling.  She was seen 3 weeks ago for an evaluation of varicose veins causing left lower leg pain. She is still having pain and is using Aleve on a daily basis. She is wearing her compression hose daily. I have reviewed the history of present illness with the patient.  HPI  Past Medical History:  Diagnosis Date  . Back pain    spinal stenosis  . Psoriasis    [name]  . Thyroid disease     Past Surgical History:  Procedure Laterality Date  . ABDOMINAL HYSTERECTOMY  2007   endometriosis, [name]    Family History  Problem Relation Age of Onset  . Heart disease Father   . Diabetes Paternal Uncle     Social History Social History  Substance Use Topics  . Smoking status: Never Smoker  . Smokeless tobacco: Never Used  . Alcohol use No    No Known Allergies  Current Outpatient Prescriptions  Medication Sig Dispense Refill  . aspirin EC 81 MG tablet Take 81 mg by mouth daily.    [name] (EUCRISA) 2 % OINT Apply topically 2 (two) times daily.    [name] Fe Cbn-Fe Gluc-FA-B12-C-DSS (FERRALET 90 PO) Take by mouth daily.    . fish oil-omega-3 fatty acids 1000 MG capsule Take 2 g by mouth daily.    [name] levothyroxine (SYNTHROID, LEVOTHROID) 75 MCG tablet daily.  3  . Probiotic Product (TRUBIOTICS PO) Take by mouth daily.    [name] rOPINIRole (REQUIP) 0.5 MG tablet daily.  3  . Wheat Dextrin (BENEFIBER) POWD Take by mouth 2 (two) times daily.     No current facility-administered medications for this visit.     Review of Systems Review of Systems  Constitutional: Negative.   Respiratory: Negative.   Cardiovascular: Negative.     Blood pressure 132/74, pulse 72, resp. rate 12, height 5\' 6"  (1.676 m), weight 162 lb (73.5 kg).  Physical  Exam Physical Exam  Constitutional: She is oriented to person, place, and time. She appears well-developed and well-nourished.  Cardiovascular:  Pulses:      Dorsalis pedis pulses are 2+ on the right side, and 2+ on the left side.       Posterior tibial pulses are 2+ on the right side, and 2+ on the left side.  No stasis changes on right lower extremity  VV extending from postero medial calf to upper thigh on left side. Deep skin pigmentation left leg lower 1/3 medially, improved from a few weeks ago but still notably tender   Neurological: She is alert and oriented to person, place, and time.  Skin: Skin is warm and dry.    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 Minimally improved with una boot and compression. Patient still requiring Aleve daily for comfort. Will benefit with ablation of the incompetent vein to prevent ulceration.     Plan       Complete bilateral venous duplex study in one week   This information has been scribed by Gaspar Cola CMA.    SANKAR,SEEPLAPUTHUR G 03/22/2016, 11:45 AM

## 2016-03-28 ENCOUNTER — Encounter: Payer: Self-pay | Admitting: General Surgery

## 2016-03-28 ENCOUNTER — Other Ambulatory Visit: Payer: Self-pay

## 2016-03-28 ENCOUNTER — Ambulatory Visit (INDEPENDENT_AMBULATORY_CARE_PROVIDER_SITE_OTHER): Payer: 59 | Admitting: General Surgery

## 2016-03-28 VITALS — BP 132/64 | HR 74 | Ht 64.0 in | Wt 164.0 lb

## 2016-03-28 DIAGNOSIS — I87322 Chronic venous hypertension (idiopathic) with inflammation of left lower extremity: Secondary | ICD-10-CM

## 2016-03-28 DIAGNOSIS — I83892 Varicose veins of left lower extremities with other complications: Secondary | ICD-10-CM | POA: Diagnosis not present

## 2016-03-28 DIAGNOSIS — I8392 Asymptomatic varicose veins of left lower extremity: Secondary | ICD-10-CM | POA: Diagnosis not present

## 2016-03-28 NOTE — Progress Notes (Signed)
Patient ID: [name], female   DOB: 02-09-1961, 55 y.o.   MRN: [id number]  Chief Complaint  Patient presents with  . Follow-up    HPI [name] is a 55 y.o. female.  Follow up venous duplex study. She has been wearing her thigh length compression hose. She states the left lower leg had some itching this weekend but over all it feels some better. She still has pain in left lower leg over the medial aspect and is using naproxen and or ibuprofen daily. I have reviewed the history of present illness with the patient. HPI  Past Medical History:  Diagnosis Date  . Back pain    spinal stenosis  . Psoriasis    [name]  . Thyroid disease     Past Surgical History:  Procedure Laterality Date  . ABDOMINAL HYSTERECTOMY  2007   endometriosis, [name]    Family History  Problem Relation Age of Onset  . Heart disease Father   . Diabetes Paternal Uncle     Social History Social History  Substance Use Topics  . Smoking status: Never Smoker  . Smokeless tobacco: Never Used  . Alcohol use No    No Known Allergies  Current Outpatient Prescriptions  Medication Sig Dispense Refill  . aspirin EC 81 MG tablet Take 81 mg by mouth daily.    [name] (EUCRISA) 2 % OINT Apply topically 2 (two) times daily.    [name] Kitchen Fe Cbn-Fe Gluc-FA-B12-C-DSS (FERRALET 90 PO) Take by mouth daily.    . fish oil-omega-3 fatty acids 1000 MG capsule Take 2 [name] by mouth daily.    [name] Kitchen levothyroxine (SYNTHROID, LEVOTHROID) 75 MCG tablet daily.  3  . Probiotic Product (TRUBIOTICS PO) Take by mouth daily.    [name] Kitchen rOPINIRole (REQUIP) 0.5 MG tablet daily.  3  . Wheat Dextrin (BENEFIBER) POWD Take by mouth 2 (two) times daily.     No current facility-administered medications for this visit.     Review of Systems Review of Systems  Constitutional: Negative.   Respiratory: Negative.   Cardiovascular: Negative.     Blood pressure 132/64, pulse 74, height 5\' 4"  (1.626 m), weight 164 lb (74.4  kg).  Physical Exam Physical Exam  Constitutional: She is oriented to person, place, and time. She appears well-developed and well-nourished.  Neurological: She is alert and oriented to person, place, and time.  Skin: Skin is warm and dry.  Psychiatric: Her behavior is normal.  Significant brawny induration of the skin with tenderness noted in the lower one third medially in the left leg. This has improved slightly from the initial presentation when she had the in addition to the tenderness some areas of weeping. The Unna boot help to minimize a all the drainage. There is still edema in the leg. Varicose veins are again noted in the thigh and calf area The right leg in sharp contrast has normal skin. there are a few mildly ectatic veins are noted. She has some minimal symptoms of heaviness in the right leg.   Data Reviewed Progress notes. Duplex scan of the lower extremity vein was completed today.  On the right side the patient has abnormal junctional reflux of greater than 3 seconds involving the main greater saphenous vein.  The common femoral vein has no reflux. The popliteal vein and lesser saphenous vein are normal. No evidence of deep vein thrombosis.  On the left side patient has an ectatic lateral branch of the greater saphenous vein which measures  9.6 mm in size. It exhibits a junctional reflux of greater than 3 seconds.The main branch of the greater saphenous vein medially appears normal in size and shows no reflux The lateral branch in the mid thigh becomes superficial giving rise to the visible varicosities seen. The common femoral vein has no reflux, the popliteal vein is normal. Lesser saphenous vein also was normal. About 10 cm above the left medial malleolus overlying the area of severe stasis pigmentation and tenderness there is a single perforator vein which shows reflux in excess of 2.5 seconds  No evidence of deep vein thrombosis  Impression: Abnormal reflux involving  the lateral branch of the left greater saphenous vein Abnormal reflux involving single perforator vein in the lower one third of left leg Abnormal reflux in the main greater saphenous vein in the right thigh No abnormal reflux noted in the common femoral vein or the superficial femoral vein s pigmentation and tenderness there is a single perforator vein which shows reflux in excess of 2.5 seconds  Impression:  Assessment    Severe symptomatic stasis dermatitis involving the left leg in the lower one third resulting from incompetence of the lateral branch of the greater saphenous vein and a single incompetent perforator vein in the lower left leg. Given the persistence of pain and tenderness in the stasis changes in the left leg patient will be best served by ablation of the lateral branch of the greater saphenous vein in the thigh and the incompetent perforator vein in the lower leg. Stab phlebectomy following this can be considered if necessary based on symptoms.  The procedures were discussed and explained in full to the patient including the risks and benefits. She is agreeable and  this will be arranged.     Plan    Schedule radiofrequency ablation of the lateral branch of the left greater saphenous vein and the left lower leg incompetent perforator veins followed later by a stab phlebectomy.        This information has been scribed by Karie Fetch RN, BSN,BC.    [name] [name] 03/31/2016, 10:23 AM

## 2016-03-28 NOTE — Patient Instructions (Signed)
The patient is aware to call back for any questions or concerns.  

## 2016-03-30 ENCOUNTER — Other Ambulatory Visit: Payer: Self-pay | Admitting: Internal Medicine

## 2016-03-30 DIAGNOSIS — Z1231 Encounter for screening mammogram for malignant neoplasm of breast: Secondary | ICD-10-CM

## 2016-03-31 ENCOUNTER — Encounter: Payer: Self-pay | Admitting: General Surgery

## 2016-05-02 ENCOUNTER — Other Ambulatory Visit: Payer: Self-pay

## 2016-05-02 ENCOUNTER — Ambulatory Visit (INDEPENDENT_AMBULATORY_CARE_PROVIDER_SITE_OTHER): Payer: 59 | Admitting: General Surgery

## 2016-05-02 VITALS — Resp 12 | Ht 67.0 in | Wt 166.0 lb

## 2016-05-02 DIAGNOSIS — I83892 Varicose veins of left lower extremities with other complications: Secondary | ICD-10-CM

## 2016-05-02 NOTE — Patient Instructions (Signed)
The patient is aware to call back for any questions or concerns.  

## 2016-05-02 NOTE — Progress Notes (Signed)
Patient ID: [name], female   DOB: 09-22-1960, 55 y.o.   MRN: [id number]  Chief Complaint  Patient presents with  . Procedure    left venous procedure    HPI [name] is a 55 y.o. female.  Here today for left venous procedure as scheduled. I have reviewed the history of present illness with the patient.  HPI  Past Medical History:  Diagnosis Date  . Back pain    spinal stenosis  . Psoriasis    [name]  . Thyroid disease     Past Surgical History:  Procedure Laterality Date  . ABDOMINAL HYSTERECTOMY  2007   endometriosis, [name]    Family History  Problem Relation Age of Onset  . Heart disease Father   . Diabetes Paternal Uncle     Social History Social History  Substance Use Topics  . Smoking status: Never Smoker  . Smokeless tobacco: Never Used  . Alcohol use No    No Known Allergies  Current Outpatient Prescriptions  Medication Sig Dispense Refill  . aspirin EC 81 MG tablet Take 81 mg by mouth daily.    [name] (EUCRISA) 2 % OINT Apply topically 2 (two) times daily.    [name] Fe Cbn-Fe Gluc-FA-B12-C-DSS (FERRALET 90 PO) Take by mouth daily.    . fish oil-omega-3 fatty acids 1000 MG capsule Take 2 g by mouth daily.    [name] levothyroxine (SYNTHROID, LEVOTHROID) 75 MCG tablet daily.  3  . Probiotic Product (TRUBIOTICS PO) Take by mouth daily.    [name] rOPINIRole (REQUIP) 0.5 MG tablet daily.  3  . Wheat Dextrin (BENEFIBER) POWD Take by mouth 2 (two) times daily.     No current facility-administered medications for this visit.     Review of Systems Review of Systems  Constitutional: Negative.   Respiratory: Negative.   Cardiovascular: Negative.     Resp. rate 12, height 5\' 7"  (1.702 m), weight 166 lb (75.3 kg).  Physical Exam Physical Exam  Constitutional: She is oriented to person, place, and time. She appears well-developed and well-nourished.  Neurological: She is alert and oriented to person, place, and time.  Skin: Skin  is warm and dry.  Psychiatric: Her behavior is normal.    Data Reviewed Progress notes.  Assessment    VV left leg with severe stasis changes.     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 The patient was then positioned supine on the procedure table.  The limb was sterilely prepped and draped. The RF closurefast catheter was placed in a sterile field.  The patient was placed in reverse-Trendelenburg position and local anesthesia was instilled in the skin overlying the access site. A skin incision was made overlying the identified and mapped entry site. The vein was punctured through the incision, and using ultrasound guidance and the Seldinger technique, a guidewire was introduced through the needle, which was then  exchanged over the guidewire for a sheath. The guidewire was removed and the sheath was flushed. The RF probe was placed into the vein through the sheath and positioned at a point 4cm to the SF junction using ultrasound guidance.A benign large node was pressing on the vein at this site.  After the RF probe position was verified by ultrasound, tumescent anesthesia was infiltrated, under ultrasound guidance, precisely into the perivenous compartment along the entire length of the vein from the entry site to the saphenofemoral junction until a "halo" of fluid was noted from the vein.  The patient was then placed in Trendelenburg position. RF energy  was applied to the 3 segments of 3 cm each.  Repeat ultrasound of the saphenous vein was performed, confirming successful treatment. The catheter and sheath were withdrawn and hemostasis established with direct pressure.  After assuring hemostasis, the skin incision over the saphenous vein was closed with a bandage and a compression wrap. Patient experienced no immediate complications.  CC:  [name], MD  [facility]   This information has been scribed by Karie Fetch RN, BSN,BC.  [name] G 05/02/2016, 1:56 PM

## 2016-05-04 ENCOUNTER — Encounter: Payer: Self-pay | Admitting: General Surgery

## 2016-05-04 ENCOUNTER — Ambulatory Visit: Payer: Self-pay

## 2016-05-04 ENCOUNTER — Ambulatory Visit (INDEPENDENT_AMBULATORY_CARE_PROVIDER_SITE_OTHER): Payer: 59 | Admitting: General Surgery

## 2016-05-04 VITALS — BP 124/70 | HR 72 | Resp 12 | Ht 67.0 in | Wt 167.0 lb

## 2016-05-04 DIAGNOSIS — I83892 Varicose veins of left lower extremities with other complications: Secondary | ICD-10-CM

## 2016-05-04 NOTE — Patient Instructions (Signed)
Return as scheduled 

## 2016-05-04 NOTE — Progress Notes (Signed)
Patient ID: [name], female   DOB: 04-28-1961, 55 y.o.   MRN: [id number]  Chief Complaint  Patient presents with  . Follow-up    HPI [name] is a 55 y.o. female here today for her post RF ablation left ALV check. No complaints. I have reviewed the history of present illness with the patient.  HPI  Past Medical History:  Diagnosis Date  . Back pain    spinal stenosis  . Psoriasis    [name]  . Thyroid disease     Past Surgical History:  Procedure Laterality Date  . ABDOMINAL HYSTERECTOMY  2007   endometriosis, [name]    Family History  Problem Relation Age of Onset  . Heart disease Father   . Diabetes Paternal Uncle     Social History Social History  Substance Use Topics  . Smoking status: Never Smoker  . Smokeless tobacco: Never Used  . Alcohol use No    No Known Allergies  Current Outpatient Prescriptions  Medication Sig Dispense Refill  . aspirin EC 81 MG tablet Take 81 mg by mouth daily.    [name] (EUCRISA) 2 % OINT Apply topically 2 (two) times daily.    [name] Fe Cbn-Fe Gluc-FA-B12-C-DSS (FERRALET 90 PO) Take by mouth daily.    . fish oil-omega-3 fatty acids 1000 MG capsule Take 2 g by mouth daily.    [name] levothyroxine (SYNTHROID, LEVOTHROID) 75 MCG tablet daily.  3  . Probiotic Product (TRUBIOTICS PO) Take by mouth daily.    [name] rOPINIRole (REQUIP) 0.5 MG tablet daily.  3  . Wheat Dextrin (BENEFIBER) POWD Take by mouth 2 (two) times daily.     No current facility-administered medications for this visit.     Review of Systems Review of Systems  Constitutional: Negative.   Respiratory: Negative.   Cardiovascular: Negative.     Blood pressure 124/70, pulse 72, resp. rate 12, height 5\' 7"  (1.702 m), weight 167 lb (75.8 kg).  Physical Exam Physical Exam  Constitutional: She is oriented to person, place, and time. She appears well-developed and well-nourished.  Neurological: She is alert and oriented to person, place,  and time.  Skin: Skin is warm and dry.  Left thigh puncture site looks clean. Mild ecchymosis. [place]- treated portion of vein is non compressible,  Portion of ALV above the lymph node is still patent. Stasis changes in left leg appears markedly better  Data Reviewed Notes reviewed   Assessment    Post RF ablation left ALV    Plan      Follow up as scheduled next week  This information has been scribed by Gaspar Cola CMA.   [name] 05/04/2016, 10:47 AM

## 2016-05-10 ENCOUNTER — Ambulatory Visit (INDEPENDENT_AMBULATORY_CARE_PROVIDER_SITE_OTHER): Payer: 59 | Admitting: General Surgery

## 2016-05-10 ENCOUNTER — Encounter: Payer: Self-pay | Admitting: General Surgery

## 2016-05-10 VITALS — BP 138/82 | HR 70 | Resp 12 | Ht 64.0 in | Wt 170.0 lb

## 2016-05-10 DIAGNOSIS — I83892 Varicose veins of left lower extremities with other complications: Secondary | ICD-10-CM | POA: Diagnosis not present

## 2016-05-10 DIAGNOSIS — I87322 Chronic venous hypertension (idiopathic) with inflammation of left lower extremity: Secondary | ICD-10-CM

## 2016-05-10 NOTE — Patient Instructions (Signed)
Return to clinic in 10 days for follow up appointment.

## 2016-05-10 NOTE — Progress Notes (Signed)
Patient ID: [name], female   DOB: 1961-03-13, 55 y.o.   MRN: [id number]  Chief Complaint  Patient presents with  . Follow-up    HPI [name] is a 55 y.o. female.  Here today for follow up post left leg venous closure procedure and planned stab phlebectomy. I have reviewed the history of present illness with the patient.   HPI  Past Medical History:  Diagnosis Date  . Back pain    spinal stenosis  . Psoriasis    [name]  . Thyroid disease     Past Surgical History:  Procedure Laterality Date  . ABDOMINAL HYSTERECTOMY  2007   endometriosis, [name]    Family History  Problem Relation Age of Onset  . Heart disease Father   . Diabetes Paternal Uncle     Social History Social History  Substance Use Topics  . Smoking status: Never Smoker  . Smokeless tobacco: Never Used  . Alcohol use No    No Known Allergies  Current Outpatient Prescriptions  Medication Sig Dispense Refill  . aspirin EC 81 MG tablet Take 81 mg by mouth daily.    [name] (EUCRISA) 2 % OINT Apply topically 2 (two) times daily.    [name] Fe Cbn-Fe Gluc-FA-B12-C-DSS (FERRALET 90 PO) Take by mouth daily.    . fish oil-omega-3 fatty acids 1000 MG capsule Take 2 g by mouth daily.    [name] levothyroxine (SYNTHROID, LEVOTHROID) 75 MCG tablet daily.  3  . Probiotic Product (TRUBIOTICS PO) Take by mouth daily.    [name] rOPINIRole (REQUIP) 0.5 MG tablet daily.  3  . Wheat Dextrin (BENEFIBER) POWD Take by mouth 2 (two) times daily.     No current facility-administered medications for this visit.     Review of Systems Review of Systems  Constitutional: Negative.   Respiratory: Negative.   Cardiovascular: Negative.     Blood pressure 138/82, pulse 70, resp. rate 12, height 5\' 4"  (1.626 m), weight 170 lb (77.1 kg).  Physical Exam Physical Exam Left leg stasis changes are much less prominent . No edema.  Data Reviewed  prior notes.  Assessment    Symptomatic VV left  leg. Post RF ablation left ALV.    Plan   Only the distal part of left ALV was ablated. Accordingly liagtion aof the ALV near the groin and stab phlebectomy done today.  Anesthetic: 10 ml 1% xuylocaine mixed with o.5 % marcaine  Prep : ChloraPrep  Description: With ultrasound probe and the proximal ALV was identified and marked on the skin. The varicosities seen in the mid thigh were also marked and 23 places. The local anesthetic was instilled in all the spots. A transverse incision about 2 cm was made overlying the proximal ALV. Further dissection was carried out in an additional 3 mL of 1% Xylocaine was instilled in the area. Subsequently the ALV was identified freed and subsequently proximally and distally ligated with 3-0 Vicryl and cut. The deep layer closed with 3-0 Vicryl and the skin with subcuticular 4-0 Vicryl. The previously marked stab areas were opened with the tiny stab incisions. The varicosity underlying was lifted up with the hook freed and proximally and distally ligated with 3-0 Vicryl and the intervening portion,. Following this all incisions were covered with Dermabond. Patient tolerated the procedure well with no immediate problems encountered    Return to clinic in 10 days for follow up.       This information has been scribed by  Karie Fetch RN, BSN,BC.   SANKAR,SEEPLAPUTHUR G 05/11/2016, 10:40 AM

## 2016-05-11 ENCOUNTER — Ambulatory Visit: Payer: 59

## 2016-05-18 ENCOUNTER — Ambulatory Visit
Admission: RE | Admit: 2016-05-18 | Discharge: 2016-05-18 | Disposition: A | Discharge status: Home / Self Care | Payer: 59 | Source: Ambulatory Visit | Attending: Internal Medicine | Admitting: Internal Medicine

## 2016-05-18 DIAGNOSIS — Z1231 Encounter for screening mammogram for malignant neoplasm of breast: Secondary | ICD-10-CM

## 2016-05-23 ENCOUNTER — Ambulatory Visit (INDEPENDENT_AMBULATORY_CARE_PROVIDER_SITE_OTHER): Payer: 59 | Admitting: General Surgery

## 2016-05-23 ENCOUNTER — Encounter: Payer: Self-pay | Admitting: General Surgery

## 2016-05-23 VITALS — BP 146/82 | HR 82 | Resp 12 | Ht 67.0 in | Wt 179.0 lb

## 2016-05-23 DIAGNOSIS — I87322 Chronic venous hypertension (idiopathic) with inflammation of left lower extremity: Secondary | ICD-10-CM

## 2016-05-23 NOTE — Patient Instructions (Addendum)
Return in six weeks

## 2016-05-23 NOTE — Progress Notes (Signed)
Patient ID: [name], female   DOB: 1960/08/01, 56 y.o.   MRN: [id number]  Chief Complaint  Patient presents with  . Follow-up    Stab    HPI [name] is a 56 y.o. female here today for her follow up stab phlebectomy done on 05/10/2016. Patient states she is doing well.                   I have reviewed the history of present illness with the patient.  HPI  Past Medical History:  Diagnosis Date  . Back pain    spinal stenosis  . Psoriasis    [name]  . Thyroid disease     Past Surgical History:  Procedure Laterality Date  . ABDOMINAL HYSTERECTOMY  2007   endometriosis, [name]    Family History  Problem Relation Age of Onset  . Heart disease Father   . Diabetes Paternal Uncle   . Breast cancer Neg Hx     Social History Social History  Substance Use Topics  . Smoking status: Never Smoker  . Smokeless tobacco: Never Used  . Alcohol use No    No Known Allergies  Current Outpatient Prescriptions  Medication Sig Dispense Refill  . aspirin EC 81 MG tablet Take 81 mg by mouth daily.    [name] (EUCRISA) 2 % OINT Apply topically 2 (two) times daily.    [name] Fe Cbn-Fe Gluc-FA-B12-C-DSS (FERRALET 90 PO) Take by mouth daily.    . fish oil-omega-3 fatty acids 1000 MG capsule Take 2 g by mouth daily.    [name] levothyroxine (SYNTHROID, LEVOTHROID) 75 MCG tablet daily.  3  . Probiotic Product (TRUBIOTICS PO) Take by mouth daily.    [name] rOPINIRole (REQUIP) 0.5 MG tablet daily.  3  . Wheat Dextrin (BENEFIBER) POWD Take by mouth 2 (two) times daily.     No current facility-administered medications for this visit.     Review of Systems Review of Systems  Constitutional: Negative.   Respiratory: Negative.   Cardiovascular: Negative.     Blood pressure (!) 146/82, pulse 82, resp. rate 12, height 5\' 7"  (1.702 m), weight 179 lb (81.2 kg).  Physical Exam Physical Exam  Constitutional: She is oriented to person, place, and time. She appears  well-developed and well-nourished.  Neurological: She is alert and oriented to person, place, and time.  Skin: Skin is warm and dry.  Left thigh incision/stab sites are healing well. No ecchymosis or other problems noted. The large varicose veins in the distal half of the thigh are not easily identified today.  The stasis changes in the lower third of the leg appears stable with no significant changes since the last 2 visits.   Data Reviewed None  Assessment    Symptomatic varicose veins with stasis complications. Good immediate results with RF ablation of left ALV and stab plebectomy.    Plan   Continue with compression hose and skin lotions as of present. Call for any new symptoms or concerns.     Patient to return in 6 weeks This information has been scribed by Gaspar Cola CMA.   Shedric Fredericks G 05/23/2016, 4:41 PM

## 2016-07-03 ENCOUNTER — Ambulatory Visit: Payer: 59 | Admitting: General Surgery

## 2016-07-03 ENCOUNTER — Encounter: Payer: Self-pay | Admitting: General Surgery

## 2016-07-03 ENCOUNTER — Ambulatory Visit (INDEPENDENT_AMBULATORY_CARE_PROVIDER_SITE_OTHER): Payer: 59 | Admitting: General Surgery

## 2016-07-03 VITALS — BP 124/74 | Resp 12 | Ht 63.0 in | Wt 172.0 lb

## 2016-07-03 DIAGNOSIS — I83892 Varicose veins of left lower extremities with other complications: Secondary | ICD-10-CM

## 2016-07-03 NOTE — Patient Instructions (Signed)
Continue with compression hose and skin lotions as of present. Call for any new symptoms or concerns.    Recommended patient tries OTC zinc oxide over irritated area around L calf and ankle.  Patient to return in 6 weeks for L leg [place].

## 2016-07-03 NOTE — Progress Notes (Signed)
Patient ID: [name], female   DOB: 1960/12/29, 56 y.o.   MRN: [id number]  Chief Complaint  Patient presents with  . Follow-up    Stabb    HPI [name] is a 56 y.o. female here today for her six week stab follow up. Patient states she is still wearing her hose. Patient reports that 2 weeks ago she started scratching the irritated area which led to oozing at the site and increased irritation. She states she is otherwise pleased with the results and denies any other problems. I have reviewed the history of present illness with the patient.  HPI  Past Medical History:  Diagnosis Date  . Back pain    spinal stenosis  . Psoriasis    [name]  . Thyroid disease     Past Surgical History:  Procedure Laterality Date  . ABDOMINAL HYSTERECTOMY  2007   endometriosis, [name]    Family History  Problem Relation Age of Onset  . Heart disease Father   . Diabetes Paternal Uncle   . Breast cancer Neg Hx     Social History Social History  Substance Use Topics  . Smoking status: Never Smoker  . Smokeless tobacco: Never Used  . Alcohol use No    No Known Allergies  Current Outpatient Prescriptions  Medication Sig Dispense Refill  . aspirin EC 81 MG tablet Take 81 mg by mouth daily.    [name] (EUCRISA) 2 % OINT Apply topically 2 (two) times daily.    [name] Fe Cbn-Fe Gluc-FA-B12-C-DSS (FERRALET 90 PO) Take by mouth daily.    . fish oil-omega-3 fatty acids 1000 MG capsule Take 2 g by mouth daily.    [name] levothyroxine (SYNTHROID, LEVOTHROID) 75 MCG tablet daily.  3  . Probiotic Product (TRUBIOTICS PO) Take by mouth daily.    [name] rOPINIRole (REQUIP) 0.5 MG tablet daily.  3  . Wheat Dextrin (BENEFIBER) POWD Take by mouth 2 (two) times daily.    [name] triamcinolone cream (KENALOG) 0.1 % Apply 1 application topically 2 (two) times daily.     No current facility-administered medications for this visit.     Review of Systems Review of Systems  Constitutional:  Negative.   Respiratory: Negative.   Cardiovascular: Negative.     Blood pressure 124/74, resp. rate 12, height 5\' 3"  (1.6 m), weight 172 lb (78 kg).  Physical Exam Physical Exam  Constitutional: She is oriented to person, place, and time. She appears well-developed and well-nourished.  Cardiovascular: Normal rate, regular rhythm and intact distal pulses.   Pulses:      Dorsalis pedis pulses are 2+ on the right side, and 2+ on the left side.       Posterior tibial pulses are 2+ on the right side, and 2+ on the left side.  Stasis change in lower 1/3 of left leg appears to some improved. Still has mild edema in left leg. VV in medial left thigh have resolved.  Neurological: She is alert and oriented to person, place, and time.  Skin: Skin is warm and dry.    Data Reviewed None  Assessment    Symptomatic varicose veins with stasis complications. Good immediate results with RF ablation of left ALV and stab phlebectomy.    Plan     Continue with compression hose and skin lotions as of present. Call for any new symptoms or concerns.    Recommended patient tries OTC zinc oxide over irritated area around L calf and ankle.  Patient to return in 6 weeks for L leg Venous Duplex study This information has been scribed by Gaspar Cola CMA.   SANKAR,SEEPLAPUTHUR G 07/04/2016, 12:18 PM

## 2016-07-19 ENCOUNTER — Encounter: Payer: Self-pay | Admitting: General Surgery

## 2016-08-14 ENCOUNTER — Encounter: Payer: Self-pay | Admitting: General Surgery

## 2016-08-14 ENCOUNTER — Ambulatory Visit (INDEPENDENT_AMBULATORY_CARE_PROVIDER_SITE_OTHER): Payer: 59 | Admitting: General Surgery

## 2016-08-14 VITALS — BP 114/70 | HR 72 | Resp 14 | Ht 64.0 in | Wt 166.0 lb

## 2016-08-14 DIAGNOSIS — I83892 Varicose veins of left lower extremities with other complications: Secondary | ICD-10-CM

## 2016-08-14 NOTE — Patient Instructions (Signed)
Continue to wear hose. Follow up in 6 months.

## 2016-08-14 NOTE — Progress Notes (Signed)
Patient ID: [name], female   DOB: 1961/02/22, 56 y.o.   MRN: [id number]  Chief Complaint  Patient presents with  . Routine Post Op    HPI [name] is a 56 y.o. female here today for her six week stab follow up. Patient states she is still wearing her hose at times. Her left leg pain and discomfort have improved. I have reviewed the history of present illness with the patient.  HPI  Past Medical History:  Diagnosis Date  . Back pain    spinal stenosis  . Psoriasis    [name]  . Thyroid disease     Past Surgical History:  Procedure Laterality Date  . ABDOMINAL HYSTERECTOMY  2007   endometriosis, [name]    Family History  Problem Relation Age of Onset  . Heart disease Father   . Diabetes Paternal Uncle   . Breast cancer Neg Hx     Social History Social History  Substance Use Topics  . Smoking status: Never Smoker  . Smokeless tobacco: Never Used  . Alcohol use No    No Known Allergies  Current Outpatient Prescriptions  Medication Sig Dispense Refill  . aspirin EC 81 MG tablet Take 81 mg by mouth daily.    [name] (EUCRISA) 2 % OINT Apply topically 2 (two) times daily.    [name] Fe Cbn-Fe Gluc-FA-B12-C-DSS (FERRALET 90 PO) Take by mouth daily.    . fish oil-omega-3 fatty acids 1000 MG capsule Take 2 g by mouth daily.    [name] levothyroxine (SYNTHROID, LEVOTHROID) 75 MCG tablet daily.  3  . Probiotic Product (TRUBIOTICS PO) Take by mouth daily.    [name] rOPINIRole (REQUIP) 0.5 MG tablet daily.  3  . triamcinolone cream (KENALOG) 0.1 % Apply 1 application topically 2 (two) times daily.    . Wheat Dextrin (BENEFIBER) POWD Take by mouth 2 (two) times daily.     No current facility-administered medications for this visit.     Review of Systems Review of Systems  Constitutional: Negative.   Respiratory: Negative.   Cardiovascular: Negative.   Skin: Negative.     Blood pressure 114/70, pulse 72, resp. rate 14, height 5\' 4"  (1.626 m),  weight 166 lb (75.3 kg).  Physical Exam Physical Exam  Constitutional: She is oriented to person, place, and time. She appears well-developed and well-nourished.  Neurological: She is alert and oriented to person, place, and time.  Skin: Skin is warm, dry and intact.  brawny induration lower left leg improving. Tenderness is also reduced, still with mild edema.   Psychiatric: She has a normal mood and affect.    Data Reviewed Prior notes  Assessment    Pt is s/p RF ablation and stab phlebectomy of left leg VV(ALV)   Stasis changes in left leg have improved.  Plan    Follow up in 6 months. Discussed benefits of compression pump.     This has been scribed by Lesly Rubenstein LPN    Annelisa Ryback G 08/16/2016, 8:10 AM

## 2017-01-22 ENCOUNTER — Encounter: Payer: Self-pay | Admitting: General Surgery

## 2017-01-22 ENCOUNTER — Ambulatory Visit (INDEPENDENT_AMBULATORY_CARE_PROVIDER_SITE_OTHER): Payer: 59 | Admitting: General Surgery

## 2017-01-22 VITALS — BP 132/74 | HR 62 | Resp 12 | Ht 64.0 in | Wt 169.0 lb

## 2017-01-22 DIAGNOSIS — I83892 Varicose veins of left lower extremities with other complications: Secondary | ICD-10-CM

## 2017-01-22 NOTE — Patient Instructions (Signed)
Return in 1 month for reevaluation and [place] and reassess starting ulna boot. Counseled to continue using compression hose.

## 2017-01-22 NOTE — Progress Notes (Signed)
Patient ID: [name], female   DOB: 29-Aug-1960, 56 y.o.   MRN: [id number]  Chief Complaint  Patient presents with  . Follow-up    Varicose vein    HPI KYMBERLEY [name] is a 56 y.o. female is here today for a 6 month varicose vein follow up. Patient states she is doing well. She has been standing a lot lately at work. She has gone to the dermatologist and was given Kenalog to apply topically for relief for 2 weeks and currently denies any pain. Only has discoloration to her left calf.   HPI  Past Medical History:  Diagnosis Date  . Back pain    spinal stenosis  . Psoriasis    [name]  . Thyroid disease     Past Surgical History:  Procedure Laterality Date  . ABDOMINAL HYSTERECTOMY  2007   endometriosis, [name]    Family History  Problem Relation Age of Onset  . Heart disease Father   . Diabetes Paternal Uncle   . Breast cancer Neg Hx     Social History Social History  Substance Use Topics  . Smoking status: Never Smoker  . Smokeless tobacco: Never Used  . Alcohol use No    No Known Allergies  Current Outpatient Prescriptions  Medication Sig Dispense Refill  . aspirin EC 81 MG tablet Take 81 mg by mouth daily.    [name] (EUCRISA) 2 % OINT Apply topically 2 (two) times daily.    [name] Fe Cbn-Fe Gluc-FA-B12-C-DSS (FERRALET 90 PO) Take by mouth daily.    . fish oil-omega-3 fatty acids 1000 MG capsule Take 2 g by mouth daily.    [name] levothyroxine (SYNTHROID, LEVOTHROID) 75 MCG tablet daily.  3  . Probiotic Product (TRUBIOTICS PO) Take by mouth daily.    [name] rOPINIRole (REQUIP) 0.5 MG tablet daily.  3  . triamcinolone cream (KENALOG) 0.1 % Apply 1 application topically 2 (two) times daily.    . Wheat Dextrin (BENEFIBER) POWD Take by mouth 2 (two) times daily.     No current facility-administered medications for this visit.     Review of Systems Review of Systems  Constitutional: Negative.   Respiratory: Negative.   Cardiovascular: Negative.      Blood pressure 132/74, pulse 62, resp. rate 12, height 5\' 4"  (1.626 m), weight 169 lb (76.7 kg).  Physical Exam Physical Exam  Constitutional: She appears well-developed and well-nourished.  Eyes: Conjunctivae are normal. No scleral icterus.  Cardiovascular: Intact distal pulses.   Pulses:      Dorsalis pedis pulses are 2+ on the right side, and 2+ on the left side.       Posterior tibial pulses are 2+ on the right side, and 2+ on the left side.  Bilateral moderate lower extremity edema, L > R.   Neurological: She is alert.  Skin: Skin is warm and dry.  Left mid calf to ankle has brawny induration. No ulcers visualized.    Data Reviewed Prior notes and US imaging reviewed.  Assessment    Varicose veins with exacerbation of stasis and cellulitis of left lower leg - Patient is reluctant to try an unna boot because she is going on vacation and will reconsider using it when she returns.     Plan    Return in 1 month for reevaluation and [place] and reassess the incompetent perforator vein in left leg. Counseled to continue using compression hose and rest and elevate legs.   The patient is aware  to call back for any questions or concerns.  HPI, Physical Exam, Assessment and Plan have been scribed under the direction and in the presence of Mckinley Jewel, MD.  Verlene Mayer, CMA   I have completed the exam and reviewed the above documentation for accuracy and completeness.  I agree with the above.  Haematologist has been used and any errors in dictation or transcription are unintentional.  Jariah Jarmon G. Jamal Collin, M.D., F.A.C.S.        Junie Panning G 01/22/2017, 10:39 AM

## 2017-02-13 ENCOUNTER — Ambulatory Visit: Payer: 59 | Admitting: General Surgery

## 2017-03-21 ENCOUNTER — Ambulatory Visit: Payer: 59 | Admitting: General Surgery

## 2017-05-24 ENCOUNTER — Other Ambulatory Visit: Payer: Self-pay | Admitting: Internal Medicine

## 2017-05-31 ENCOUNTER — Other Ambulatory Visit: Payer: Self-pay | Admitting: Nurse Practitioner

## 2017-06-01 LAB — COMPREHENSIVE METABOLIC PANEL
A/G RATIO: 1.6 (ref 1.2–2.2)
ALT: 18 IU/L (ref 0–32)
AST: 21 IU/L (ref 0–40)
Albumin: 4.2 g/dL (ref 3.5–5.5)
Alkaline Phosphatase: 90 IU/L (ref 39–117)
BUN/Creatinine Ratio: 14 (ref 9–23)
BUN: 11 mg/dL (ref 6–24)
Bilirubin Total: 0.3 mg/dL (ref 0.0–1.2)
CALCIUM: 9.4 mg/dL (ref 8.7–10.2)
CO2: 23 mmol/L (ref 20–29)
CREATININE: 0.8 mg/dL (ref 0.57–1.00)
Chloride: 106 mmol/L (ref 96–106)
GFR, EST AFRICAN AMERICAN: 97 mL/min/{1.73_m2} (ref >59)
GFR, EST NON AFRICAN AMERICAN: 84 mL/min/{1.73_m2} (ref >59)
Globulin, Total: 2.6 g/dL (ref 1.5–4.5)
Glucose: 83 mg/dL (ref 65–99)
POTASSIUM: 4.7 mmol/L (ref 3.5–5.2)
Sodium: 142 mmol/L (ref 134–144)
TOTAL PROTEIN: 6.8 g/dL (ref 6.0–8.5)

## 2017-06-01 LAB — LIPID PANEL WITH LDL/HDL RATIO
Cholesterol, Total: 209 mg/dL — ABNORMAL HIGH (ref 100–199)
HDL: 78 mg/dL (ref >39)
LDL CALC: 123 mg/dL — AB (ref 0–99)
LDL/HDL RATIO: 1.6 ratio (ref 0.0–3.2)
TRIGLYCERIDES: 40 mg/dL (ref 0–149)
VLDL Cholesterol Cal: 8 mg/dL (ref 5–40)

## 2017-06-01 LAB — CBC WITH DIFFERENTIAL/PLATELET
BASOS: 0 %
Basophils Absolute: 0 10*3/uL (ref 0.0–0.2)
EOS (ABSOLUTE): 0.1 10*3/uL (ref 0.0–0.4)
EOS: 2 %
HEMATOCRIT: 35.8 % (ref 34.0–46.6)
Hemoglobin: 11.9 g/dL (ref 11.1–15.9)
IMMATURE GRANS (ABS): 0 10*3/uL (ref 0.0–0.1)
IMMATURE GRANULOCYTES: 0 %
LYMPHS: 32 %
Lymphocytes Absolute: 1.3 10*3/uL (ref 0.7–3.1)
MCH: 31.4 pg (ref 26.6–33.0)
MCHC: 33.2 g/dL (ref 31.5–35.7)
MCV: 95 fL (ref 79–97)
Monocytes Absolute: 0.6 10*3/uL (ref 0.1–0.9)
Monocytes: 14 %
NEUTROS PCT: 52 %
Neutrophils Absolute: 2.1 10*3/uL (ref 1.4–7.0)
PLATELETS: 306 10*3/uL (ref 150–379)
RBC: 3.79 x10E6/uL (ref 3.77–5.28)
RDW: 12.6 % (ref 12.3–15.4)
WBC: 4.1 10*3/uL (ref 3.4–10.8)

## 2017-06-01 LAB — TSH: TSH: 3.01 u[IU]/mL (ref 0.450–4.500)

## 2017-06-01 LAB — T4, FREE: FREE T4: 1.11 ng/dL (ref 0.82–1.77)

## 2017-06-13 ENCOUNTER — Encounter: Payer: Self-pay | Admitting: Internal Medicine

## 2017-06-13 ENCOUNTER — Ambulatory Visit: Payer: Managed Care, Other (non HMO) | Admitting: Internal Medicine

## 2017-06-13 VITALS — BP 147/97 | HR 68 | Resp 16 | Ht 64.0 in | Wt 170.2 lb

## 2017-06-13 DIAGNOSIS — E611 Iron deficiency: Secondary | ICD-10-CM

## 2017-06-13 DIAGNOSIS — Z124 Encounter for screening for malignant neoplasm of cervix: Secondary | ICD-10-CM | POA: Diagnosis not present

## 2017-06-13 DIAGNOSIS — R300 Dysuria: Secondary | ICD-10-CM

## 2017-06-13 DIAGNOSIS — I8312 Varicose veins of left lower extremity with inflammation: Secondary | ICD-10-CM | POA: Diagnosis not present

## 2017-06-13 DIAGNOSIS — Z1231 Encounter for screening mammogram for malignant neoplasm of breast: Secondary | ICD-10-CM | POA: Diagnosis not present

## 2017-06-13 DIAGNOSIS — E039 Hypothyroidism, unspecified: Secondary | ICD-10-CM

## 2017-06-13 DIAGNOSIS — G2581 Restless legs syndrome: Secondary | ICD-10-CM | POA: Insufficient documentation

## 2017-06-13 DIAGNOSIS — I10 Essential (primary) hypertension: Secondary | ICD-10-CM

## 2017-06-13 MED ORDER — LEVOTHYROXINE SODIUM 88 MCG PO TABS: 88.0000 ug | ORAL_TABLET | Freq: Every day | ORAL | 3 refills | Status: DC

## 2017-06-13 MED ORDER — TRIAMTERENE-HCTZ 37.5-25 MG PO TABS: 1.0000 | ORAL_TABLET | Freq: Every day | ORAL | 3 refills | Status: DC

## 2017-06-13 NOTE — Progress Notes (Signed)
[facility] [place], [place] [address]  Internal MEDICINE  Office Visit Note  Patient Name: [name]  [id number]  [id number]  Date of Service: 06/13/2017  Chief Complaint  Patient presents with  . Annual Exam    with pap     HPI Pt is here for routine health maintenance examination She has thrombophelibitis.  Current Medication: Outpatient Encounter Medications as of 06/13/2017  Medication Sig Note  . aspirin EC 81 MG tablet Take 81 mg by mouth daily.   [name] (EUCRISA) 2 % OINT Apply topically 2 (two) times daily.   [name] Fe Cbn-Fe Gluc-FA-B12-C-DSS (FERRALET 90 PO) Take by mouth daily.   . fish oil-omega-3 fatty acids 1000 MG capsule Take 2 g by mouth daily.   [name] levothyroxine (SYNTHROID, LEVOTHROID) 75 MCG tablet daily. 03/02/2016: Received from: External Pharmacy Received Sig: TK 1 T PO QAM  . Probiotic Product (TRUBIOTICS PO) Take by mouth daily.   [name] rOPINIRole (REQUIP) 0.5 MG tablet daily. 03/02/2016: Received from: External Pharmacy Received Sig: TK 1 T PO QHS FOR RESTLESS LEGS  . triamcinolone cream (KENALOG) 0.1 % Apply 1 application topically 2 (two) times daily.   . Wheat Dextrin (BENEFIBER) POWD Take by mouth 2 (two) times daily.    No facility-administered encounter medications on file as of 06/13/2017.     Surgical History: Past Surgical History:  Procedure Laterality Date  . ABDOMINAL HYSTERECTOMY  2007   endometriosis, [name]  . VASCULAR SURGERY Left    left leg    Medical History: Past Medical History:  Diagnosis Date  . Back pain    spinal stenosis  . Psoriasis    [name]  . Spinal stenosis   . Thyroid disease     Family History: Family History  Problem Relation Age of Onset  . Heart disease Father   . Diabetes Paternal Uncle   . Breast cancer Neg Hx       Review of Systems  Constitutional: Negative for activity change, appetite change, chills, diaphoresis, fatigue, fever and  unexpected weight change.  HENT: Negative for congestion, ear discharge, ear pain, facial swelling, hearing loss, nosebleeds, postnasal drip, rhinorrhea, sinus pressure, sinus pain, sneezing, sore throat, tinnitus, trouble swallowing and voice change.   Eyes: Negative for photophobia, pain, discharge, redness, itching and visual disturbance.  Respiratory: Negative for apnea, cough, choking, chest tightness, shortness of breath, wheezing and stridor.   Cardiovascular: Positive for leg swelling. Negative for chest pain and palpitations.  Gastrointestinal: Negative for abdominal distention, abdominal pain, anal bleeding, constipation, diarrhea, nausea and rectal pain.  Endocrine: Negative for cold intolerance, heat intolerance, polydipsia, polyphagia and polyuria.  Genitourinary: Negative for difficulty urinating, flank pain, frequency, genital sores, hematuria, menstrual problem, pelvic pain, urgency, vaginal bleeding, vaginal discharge and vaginal pain.  Musculoskeletal: Negative for arthralgias, back pain, gait problem, joint swelling, myalgias and neck pain.  Skin: Negative for color change, pallor, rash and wound.  Allergic/Immunologic: Negative for environmental allergies, food allergies and immunocompromised state.  Neurological: Negative for dizziness, seizures, syncope, facial asymmetry, speech difficulty, weakness (fsh), light-headedness, numbness and headaches.  Hematological: Negative for adenopathy. Does not bruise/bleed easily.  Psychiatric/Behavioral: Negative for agitation, behavioral problems, confusion, decreased concentration, dysphoric mood, hallucinations, self-injury, sleep disturbance and suicidal ideas. The patient is not nervous/anxious and is not hyperactive.   bp is elevated today,    Vital Signs: BP (!) 147/97 (BP Location: Left Arm, Patient Position: Sitting)   Pulse 68   Resp  16   Ht 5\' 4"  (1.626 m)   Wt 170 lb 3.2 oz (77.2 kg)   SpO2 100%   BMI 29.21 kg/m     Physical Exam  Constitutional: She appears well-developed and well-nourished. No distress.  HENT:  Head: Normocephalic and atraumatic.  Right Ear: External ear normal.  Left Ear: External ear normal.  Nose: Nose normal.  Mouth/Throat: Oropharynx is clear and moist. No oropharyngeal exudate.  Eyes: Conjunctivae and EOM are normal. Pupils are equal, round, and reactive to light. Right eye exhibits no discharge. Left eye exhibits no discharge. No scleral icterus.  Neck: Normal range of motion. Neck supple. No JVD present. No tracheal deviation present. No thyromegaly present.  Cardiovascular: Normal rate, regular rhythm, normal heart sounds and intact distal pulses. Exam reveals no gallop and no friction rub.  No murmur heard. Pulmonary/Chest: Effort normal and breath sounds normal. No stridor. No respiratory distress. She has no wheezes. She has no rales. She exhibits no tenderness. Right breast exhibits no mass. Left breast exhibits no mass. Breasts are symmetrical.  Abdominal: Soft. Bowel sounds are normal. She exhibits no distension and no mass. There is no tenderness. There is no rebound and no guarding.  Genitourinary: Vagina normal and uterus normal. There is no rash or tenderness on the right labia. There is no rash or tenderness on the left labia. Right adnexum displays no mass. Left adnexum displays no mass. No vaginal discharge found.    Musculoskeletal: Normal range of motion. She exhibits no edema, tenderness or deformity.  Lymphadenopathy:    She has no cervical adenopathy.  Neurological: She is alert. She has normal reflexes. No cranial nerve deficit. She exhibits normal muscle tone. Coordination normal.  Skin: Skin is warm and dry. Rash noted. She is not diaphoretic. There is erythema. No pallor.  Psychiatric: She has a normal mood and affect. Her behavior is normal. Judgment and thought content normal.     LABS: Recent Results (from the past 2160 hour(s))  CBC with  Differential/Platelet     Status: None   Collection Time: 05/24/17  7:28 AM  Result Value Ref Range   WBC 4.1 3.4 - 10.8 x10E3/uL   RBC 3.79 3.77 - 5.28 x10E6/uL   Hemoglobin 11.9 11.1 - 15.9 g/dL   Hematocrit 35.8 34.0 - 46.6 %   MCV 95 79 - 97 fL   MCH 31.4 26.6 - 33.0 pg   MCHC 33.2 31.5 - 35.7 g/dL   RDW 12.6 12.3 - 15.4 %   Platelets 306 150 - 379 x10E3/uL   Neutrophils 52 Not Estab. %   Lymphs 32 Not Estab. %   Monocytes 14 Not Estab. %   Eos 2 Not Estab. %   Basos 0 Not Estab. %   Neutrophils Absolute 2.1 1.4 - 7.0 x10E3/uL   Lymphocytes Absolute 1.3 0.7 - 3.1 x10E3/uL   Monocytes Absolute 0.6 0.1 - 0.9 x10E3/uL   EOS (ABSOLUTE) 0.1 0.0 - 0.4 x10E3/uL   Basophils Absolute 0.0 0.0 - 0.2 x10E3/uL   Immature Granulocytes 0 Not Estab. %   Immature Grans (Abs) 0.0 0.0 - 0.1 x10E3/uL  Comprehensive metabolic panel     Status: None   Collection Time: 05/24/17  7:28 AM  Result Value Ref Range   Glucose 83 65 - 99 mg/dL   BUN 11 6 - 24 mg/dL   Creatinine, Ser 0.80 0.57 - 1.00 mg/dL   GFR calc non Af Amer 84 >59 mL/min/1.73   GFR calc Af Amer 97 >  59 mL/min/1.73   BUN/Creatinine Ratio 14 9 - 23   Sodium 142 134 - 144 mmol/L   Potassium 4.7 3.5 - 5.2 mmol/L   Chloride 106 96 - 106 mmol/L   CO2 23 20 - 29 mmol/L   Calcium 9.4 8.7 - 10.2 mg/dL   Total Protein 6.8 6.0 - 8.5 g/dL   Albumin 4.2 3.5 - 5.5 g/dL   Globulin, Total 2.6 1.5 - 4.5 g/dL   Albumin/Globulin Ratio 1.6 1.2 - 2.2   Bilirubin Total 0.3 0.0 - 1.2 mg/dL   Alkaline Phosphatase 90 39 - 117 IU/L   AST 21 0 - 40 IU/L   ALT 18 0 - 32 IU/L  Lipid Panel With LDL/HDL Ratio     Status: Abnormal   Collection Time: 05/24/17  7:28 AM  Result Value Ref Range   Cholesterol, Total 209 (H) 100 - 199 mg/dL   Triglycerides 40 0 - 149 mg/dL   HDL 78 >39 mg/dL   VLDL Cholesterol Cal 8 5 - 40 mg/dL   LDL Calculated 123 (H) 0 - 99 mg/dL   LDl/HDL Ratio 1.6 0.0 - 3.2 ratio    Comment:                                      LDL/HDL Ratio                                             Men  Women                               1/2 Avg.Risk  1.0    1.5                                   Avg.Risk  3.6    3.2                                2X Avg.Risk  6.2    5.0                                3X Avg.Risk  8.0    6.1   T4, free     Status: None   Collection Time: 05/24/17  7:28 AM  Result Value Ref Range   Free T4 1.11 0.82 - 1.77 ng/dL  TSH     Status: None   Collection Time: 05/24/17  7:28 AM  Result Value Ref Range   TSH 3.010 0.450 - 4.500 uIU/mL    Assessment/Plan: 1. Restless leg syndrome - FSH/LH, check iron level  2. Iron deficiency - Stable  3. Varicose veins of left lower extremity with inflammation - Surgical intervention  4. Dysuria - Urine dipstick  5. Screening for malignant neoplasm of cervix - Pap IG and HPV (high risk) DNA detection  6. Screening mammogram, encounter for - MM DIGITAL SCREENING BILATERAL; Future  7. Essential hypertension, benign - Triamterene-hydrochlorothiazide (MAXZIDE-25) 37.5-25 MG tablet; Take 1 tablet by mouth daily.  Dispense: 90 tablet; Refill: 3 - Basic Metabolic Panel (BMET)  8. Hypothyroidism, unspecified type - Increase Levothyroxine (SYNTHROID, LEVOTHROID) 88 MCG tablet; Take 1 tablet (88 mcg total) by mouth daily.  Dispense: 90 tablet; Refill: 3 - TSH + free T4  General Counseling: Rhiana verbalizes understanding of the findings of todays visit and agrees with plan of treatment. I have discussed any further diagnostic evaluation that may be needed or ordered today. We also reviewed her medications today. she has been encouraged to call the office with any questions or concerns that should arise related to todays visit. Hypertension Counseling:   The following hypertensive lifestyle modification were recommended and discussed:  1. Limiting alcohol intake to less than 1 oz/day of ethanol:(24 oz of beer or 8 oz of wine or 2 oz of 100-proof  whiskey). 2. Take baby ASA 81 mg daily. 3. Importance of regular aerobic exercise and losing weight. 4. Reduce dietary saturated fat and cholesterol intake for overall cardiovascular health. 5. Maintaining adequate dietary potassium, calcium, and magnesium intake. 6. Regular monitoring of the blood pressure. 7. Reduce sodium intake to less than 100 mmol/day (less than 2.3 gm of sodium or less than 6 gm of sodium choride)    Orders Placed This Encounter  Procedures  . Urinalysis, Routine w reflex microscopic    Time spent: [place], MD  Internal Medicine

## 2017-06-15 LAB — PAP IG AND HPV HIGH-RISK
HPV, HIGH-RISK: NEGATIVE
PAP Smear Comment: 0

## 2017-07-11 ENCOUNTER — Ambulatory Visit
Admission: RE | Admit: 2017-07-11 | Discharge: 2017-07-11 | Disposition: A | Discharge status: Home / Self Care | Payer: Managed Care, Other (non HMO) | Source: Ambulatory Visit | Attending: Internal Medicine | Admitting: Internal Medicine

## 2017-07-11 DIAGNOSIS — Z1231 Encounter for screening mammogram for malignant neoplasm of breast: Secondary | ICD-10-CM | POA: Diagnosis not present

## 2017-07-12 IMAGING — MG MM DIGITAL SCREENING BILAT W/ CAD
4 series · 4 of 4 positions shown · non-contrast
Comparison: Previous exam(s).

CLINICAL DATA: Screening.

EXAM:
DIGITAL SCREENING BILATERAL MAMMOGRAM WITH CAD

[L CC]
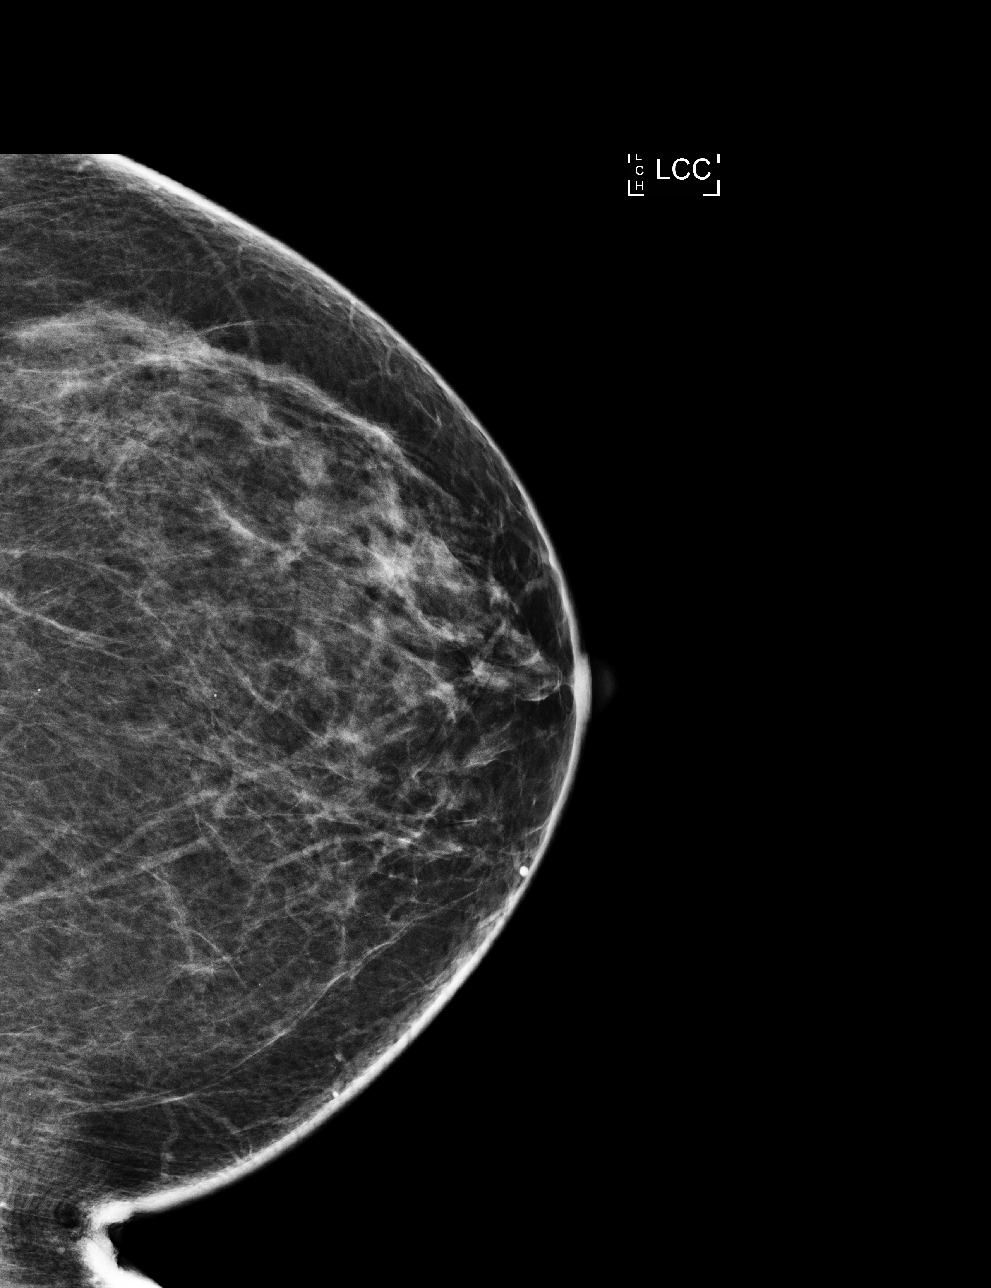

[R CC]
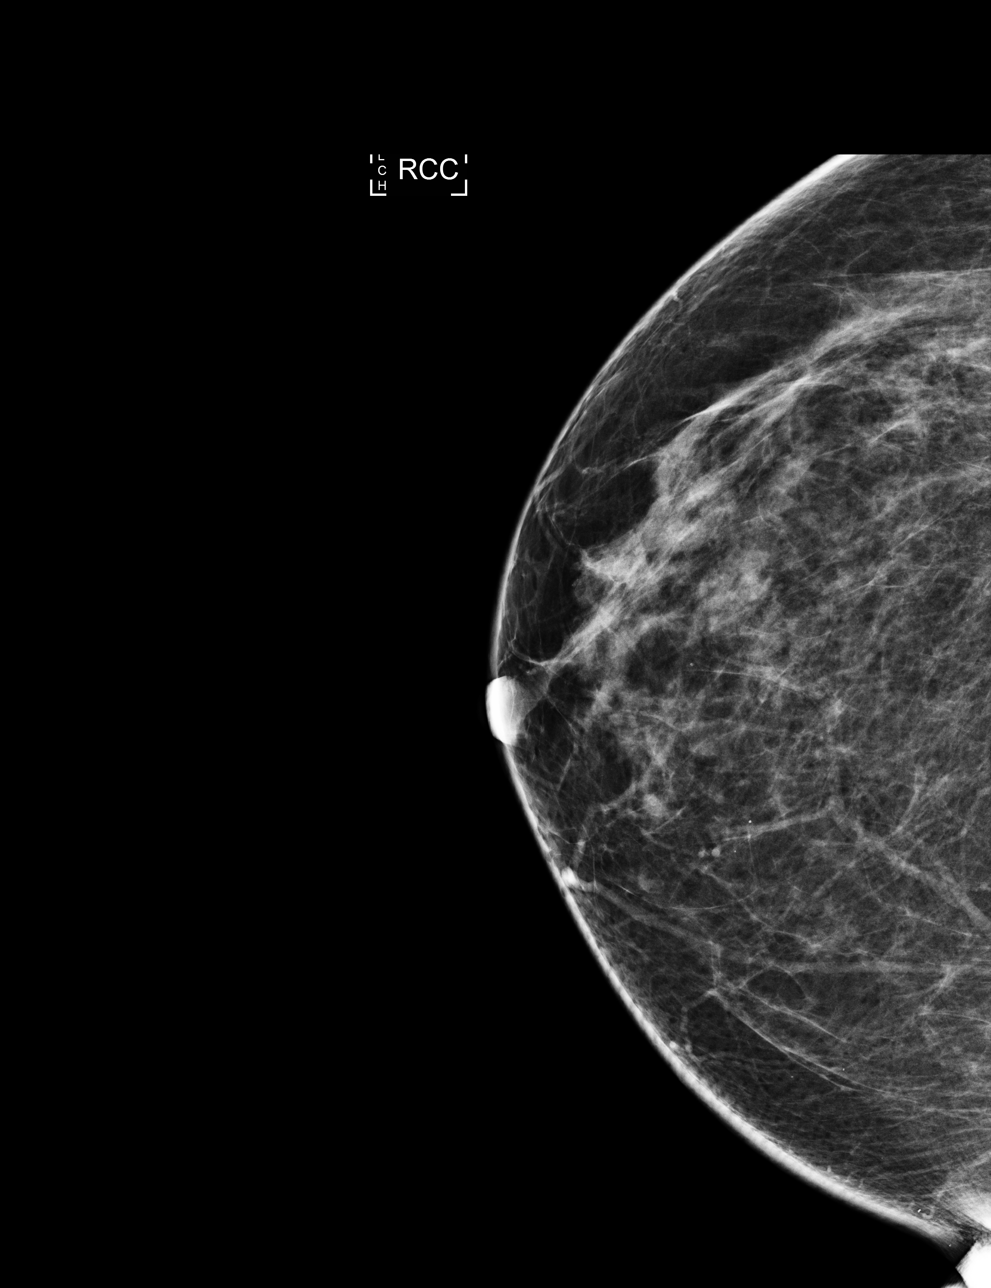

[R MLO]
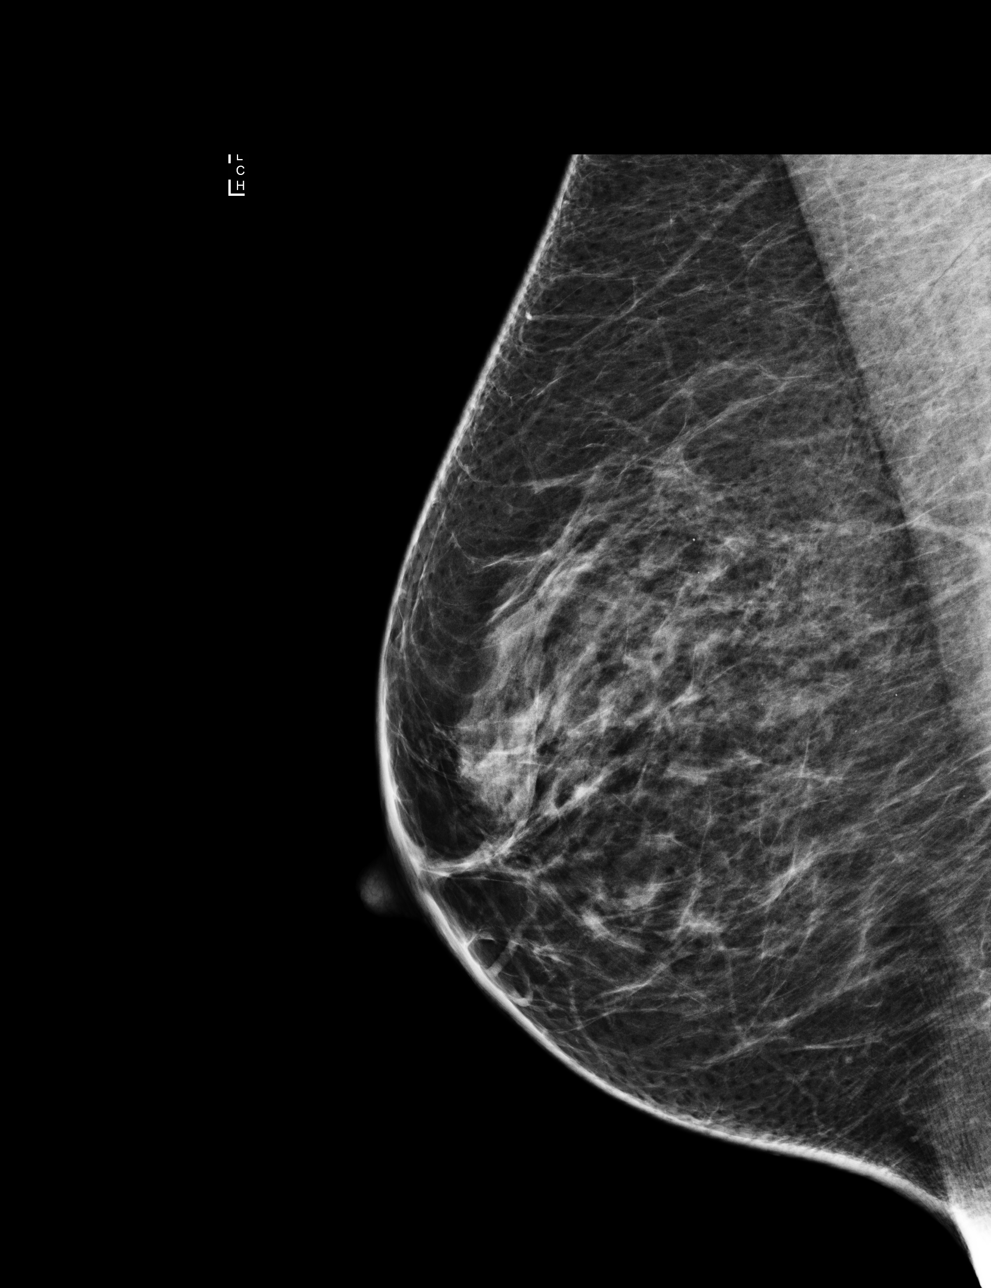

[L MLO]
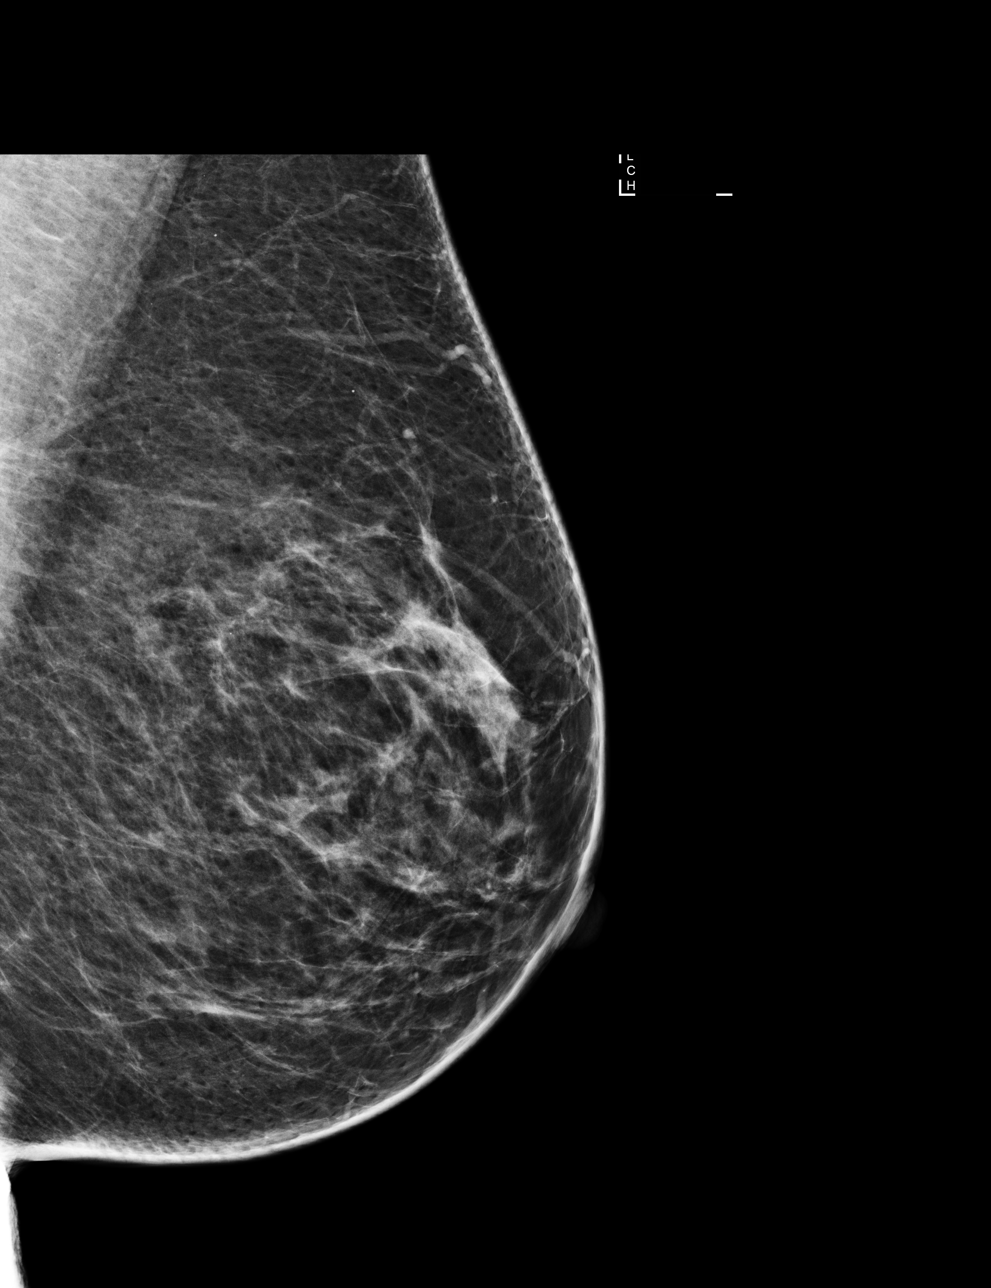

[4 of 4 positions shown; findings below may reference images not displayed]

ACR Breast Density Category b: There are scattered areas of
fibroglandular density.
FINDINGS: There are no findings suspicious for malignancy. Images were
processed with CAD.
IMPRESSION: No mammographic evidence of malignancy. A result letter of this
screening mammogram will be mailed directly to the patient.

RECOMMENDATION:
Screening mammogram in one year. (Code:AS-G-LCT)

BI-RADS CATEGORY  1: Negative.

## 2017-08-01 LAB — URINALYSIS, ROUTINE W REFLEX MICROSCOPIC
BILIRUBIN UA: NEGATIVE
Glucose, UA: NEGATIVE
Ketones, UA: NEGATIVE
Leukocytes, UA: NEGATIVE
Nitrite, UA: NEGATIVE
PH UA: 6.5 (ref 5.0–7.5)
PROTEIN UA: NEGATIVE
RBC UA: NEGATIVE
Specific Gravity, UA: 1.005 (ref 1.005–1.030)
Urobilinogen, Ur: 0.2 mg/dL (ref 0.2–1.0)

## 2017-08-01 LAB — BASIC METABOLIC PANEL
BUN / CREAT RATIO: 19 (ref 9–23)
BUN: 15 mg/dL (ref 6–24)
CO2: 24 mmol/L (ref 20–29)
Calcium: 9.3 mg/dL (ref 8.7–10.2)
Chloride: 102 mmol/L (ref 96–106)
Creatinine, Ser: 0.8 mg/dL (ref 0.57–1.00)
GFR calc non Af Amer: 84 mL/min/{1.73_m2} (ref >59)
GFR, EST AFRICAN AMERICAN: 97 mL/min/{1.73_m2} (ref >59)
GLUCOSE: 84 mg/dL (ref 65–99)
Potassium: 4.1 mmol/L (ref 3.5–5.2)
SODIUM: 143 mmol/L (ref 134–144)

## 2017-08-01 LAB — FSH/LH
FSH: 74.3 m[IU]/mL
LH: 59.2 m[IU]/mL

## 2017-08-01 LAB — TSH+FREE T4
Free T4: 1.26 ng/dL (ref 0.82–1.77)
TSH: 2.27 u[IU]/mL (ref 0.450–4.500)

## 2017-09-11 ENCOUNTER — Ambulatory Visit: Payer: Self-pay | Admitting: Internal Medicine

## 2017-10-02 ENCOUNTER — Encounter: Payer: Self-pay | Admitting: Internal Medicine

## 2017-10-02 ENCOUNTER — Ambulatory Visit (INDEPENDENT_AMBULATORY_CARE_PROVIDER_SITE_OTHER): Payer: Managed Care, Other (non HMO) | Admitting: Internal Medicine

## 2017-10-02 VITALS — BP 145/97 | HR 67 | Resp 16 | Ht 64.0 in | Wt 173.0 lb

## 2017-10-02 DIAGNOSIS — I8312 Varicose veins of left lower extremity with inflammation: Secondary | ICD-10-CM

## 2017-10-02 DIAGNOSIS — G2581 Restless legs syndrome: Secondary | ICD-10-CM | POA: Diagnosis not present

## 2017-10-02 DIAGNOSIS — E039 Hypothyroidism, unspecified: Secondary | ICD-10-CM | POA: Diagnosis not present

## 2017-10-02 DIAGNOSIS — I10 Essential (primary) hypertension: Secondary | ICD-10-CM | POA: Diagnosis not present

## 2017-10-02 MED ORDER — VALSARTAN 80 MG PO TABS: 80.0000 mg | ORAL_TABLET | Freq: Every day | ORAL | 3 refills | Status: DC

## 2017-10-02 NOTE — Progress Notes (Signed)
[facility] [place], [place] [address]  Internal MEDICINE  Office Visit Note  Patient Name: [name]  [id number]  [id number]  Date of Service: 10/02/2017  Chief Complaint  Patient presents with  . Hypertension    3 month follow up on medication   HPI Pt is here for routine follow up. She just had procedure done on her right leg. She under went sclerotherapy. She continues to have leg pain due to varicose veins. BP is still not under optimum control.   Current Medication: Outpatient Encounter Medications as of 10/02/2017  Medication Sig Note  . aspirin EC 81 MG tablet Take 81 mg by mouth daily.   [name] (EUCRISA) 2 % OINT Apply topically 2 (two) times daily.   [name] Fe Cbn-Fe Gluc-FA-B12-C-DSS (FERRALET 90 PO) Take by mouth daily.   . fish oil-omega-3 fatty acids 1000 MG capsule Take 2 g by mouth daily.   [name] levothyroxine (SYNTHROID, LEVOTHROID) 88 MCG tablet Take 1 tablet (88 mcg total) by mouth daily.   . Probiotic Product (TRUBIOTICS PO) Take by mouth daily.   [name] rOPINIRole (REQUIP) 0.5 MG tablet daily. 03/02/2016: Received from: External Pharmacy Received Sig: TK 1 T PO QHS FOR RESTLESS LEGS  . triamcinolone cream (KENALOG) 0.1 % Apply 1 application topically 2 (two) times daily.   [name] triamterene-hydrochlorothiazide (MAXZIDE-25) 37.5-25 MG tablet Take 1 tablet by mouth daily.   . Wheat Dextrin (BENEFIBER) POWD Take by mouth 2 (two) times daily.   . valsartan (DIOVAN) 80 MG tablet Take 1 tablet (80 mg total) by mouth daily.    No facility-administered encounter medications on file as of 10/02/2017.     Surgical History: Past Surgical History:  Procedure Laterality Date  . ABDOMINAL HYSTERECTOMY  2007   endometriosis, [name]  . VASCULAR SURGERY Left    left leg    Medical History: Past Medical History:  Diagnosis Date  . Back pain    spinal stenosis  . Psoriasis    [name]  . Spinal stenosis   . Thyroid disease      Family History: Family History  Problem Relation Age of Onset  . Heart disease Father   . Diabetes Paternal Uncle   . Breast cancer Neg Hx     Social History   Socioeconomic History  . Marital status: Married    Spouse name: Not on file  . Number of children: Not on file  . Years of education: Not on file  . Highest education level: Not on file  Occupational History  . Not on file  Social Needs  . Financial resource strain: Not on file  . Food insecurity:    Worry: Not on file    Inability: Not on file  . Transportation needs:    Medical: Not on file    Non-medical: Not on file  Tobacco Use  . Smoking status: Never Smoker  . Smokeless tobacco: Never Used  Substance and Sexual Activity  . Alcohol use: No  . Drug use: No  . Sexual activity: Not on file  Lifestyle  . Physical activity:    Days per week: Not on file    Minutes per session: Not on file  . Stress: Not on file  Relationships  . Social connections:    Talks on phone: Not on file    Gets together: Not on file    Attends religious service: Not on file    Active member of club or organization:  Not on file    Attends meetings of clubs or organizations: Not on file    Relationship status: Not on file  . Intimate partner violence:    Fear of current or ex partner: Not on file    Emotionally abused: Not on file    Physically abused: Not on file    Forced sexual activity: Not on file  Other Topics Concern  . Not on file  Social History Narrative   Lives in [place] with husband. Has step-daughter 24YO and grandson.      Work - [employer] in [place] - regular      Exercise - walks occasional    Review of Systems  Constitutional: Negative for chills, diaphoresis and fatigue.  HENT: Negative for ear pain, postnasal drip and sinus pressure.   Eyes: Negative for photophobia, discharge, redness, itching and visual disturbance.  Respiratory: Negative for cough, shortness of breath and  wheezing.   Cardiovascular: Positive for leg swelling. Negative for chest pain and palpitations.  Gastrointestinal: Negative for abdominal pain, constipation, diarrhea, nausea and vomiting.  Genitourinary: Negative for dysuria and flank pain.  Musculoskeletal: Positive for arthralgias. Negative for back pain, gait problem and neck pain.  Skin: Negative for color change.  Allergic/Immunologic: Negative for environmental allergies and food allergies.  Neurological: Negative for dizziness and headaches.  Hematological: Does not bruise/bleed easily.  Psychiatric/Behavioral: Negative for behavioral problems (depression) and hallucinations.   Vital Signs: BP (!) 145/97 (BP Location: Right Arm, Patient Position: Sitting, Cuff Size: Normal)   Pulse 67   Resp 16   Ht 5\' 4"  (1.626 m)   Wt 173 lb (78.5 kg)   SpO2 99%   BMI 29.70 kg/m    Physical Exam  Constitutional: She is oriented to person, place, and time. She appears well-developed and well-nourished. No distress.  HENT:  Head: Normocephalic and atraumatic.  Mouth/Throat: Oropharynx is clear and moist. No oropharyngeal exudate.  Eyes: Pupils are equal, round, and reactive to light. EOM are normal.  Neck: Normal range of motion. Neck supple. No JVD present. No tracheal deviation present. No thyromegaly present.  Cardiovascular: Normal rate, regular rhythm and normal heart sounds. Exam reveals no gallop and no friction rub.  No murmur heard. Pulmonary/Chest: Effort normal. No respiratory distress. She has no wheezes. She has no rales. She exhibits no tenderness.  Abdominal: Soft. Bowel sounds are normal.  Musculoskeletal: Normal range of motion.  Lymphadenopathy:    She has no cervical adenopathy.  Neurological: She is alert and oriented to person, place, and time. No cranial nerve deficit.  Skin: Skin is dry. She is not diaphoretic. There is erythema.  Psychiatric: She has a normal mood and affect. Her behavior is normal. Judgment and  thought content normal.   Assessment/Plan: 1. Essential hypertension, benign - Add low dose Diovan, continue triam/hctz as before   2. Hypothyroidism, unspecified type - Synthroid as before   3. Restless leg syndrome - Continue requip  4. Varicose veins of left lower extremity with inflammation - Per vein clinic   General Counseling: [name] understanding of the findings of todays visit and agrees with plan of treatment. I have discussed any further diagnostic evaluation that may be needed or ordered today. We also reviewed her medications today. she has been encouraged to call the office with any questions or concerns that should arise related to todays visit.   Meds ordered this encounter  Medications  . valsartan (DIOVAN) 80 MG tablet  Sig: Take 1 tablet (80 mg total) by mouth daily.    Dispense:  90 tablet    Refill:  3    Time spent:20 Minutes   Dr [name] Internal medicine

## 2017-11-23 ENCOUNTER — Other Ambulatory Visit: Payer: Self-pay | Admitting: Internal Medicine

## 2018-02-16 ENCOUNTER — Other Ambulatory Visit: Payer: Self-pay | Admitting: Internal Medicine

## 2018-04-29 ENCOUNTER — Ambulatory Visit: Payer: Managed Care, Other (non HMO) | Admitting: Adult Health

## 2018-04-29 ENCOUNTER — Encounter: Payer: Self-pay | Admitting: Adult Health

## 2018-04-29 VITALS — BP 144/92 | HR 85 | Temp 98.3°F | Resp 16 | Ht 64.0 in | Wt 180.0 lb

## 2018-04-29 DIAGNOSIS — I10 Essential (primary) hypertension: Secondary | ICD-10-CM | POA: Diagnosis not present

## 2018-04-29 DIAGNOSIS — T148XXD Other injury of unspecified body region, subsequent encounter: Secondary | ICD-10-CM

## 2018-04-29 NOTE — Patient Instructions (Signed)

## 2018-04-29 NOTE — Progress Notes (Signed)
[facility] [place], [place] [address]  Internal MEDICINE  Office Visit Note  Patient Name: [name]  [id number]  [id number]  Date of Service: 04/29/2018  Chief Complaint  Patient presents with  . Open Wound    had a mole removed from left leg 4 weeks ago and still not healed , noticed a odor last week, wants to make sure it is healing correctly      HPI Pt is here for a sick visit. Pt is here for second opinion on wound to left medial aspect of leg, below the knee.  She had a mole removed, that was later determined to be possible melanoma.  She then had more surgical excision.  She reports her margins are clear now.  However, her wound is still draining one month later, and appears deeper.       Current Medication:  Outpatient Encounter Medications as of 04/29/2018  Medication Sig  . aspirin EC 81 MG tablet Take 81 mg by mouth daily.  [name] (EUCRISA) 2 % OINT Apply topically 2 (two) times daily.  [name] Fe Cbn-Fe Gluc-FA-B12-C-DSS (FERRALET 90 PO) Take by mouth daily.  . fish oil-omega-3 fatty acids 1000 MG capsule Take 2 g by mouth daily.  [name] levothyroxine (SYNTHROID, LEVOTHROID) 88 MCG tablet Take 1 tablet (88 mcg total) by mouth daily.  . Probiotic Product (TRUBIOTICS PO) Take by mouth daily.  [name] rOPINIRole (REQUIP) 0.5 MG tablet TAKE 1 TABLET BY MOUTH EVERY NIGHT AT BEDTIME FOR RESTLESS LEGS  . triamcinolone cream (KENALOG) 0.1 % Apply 1 application topically 2 (two) times daily.  [name] triamterene-hydrochlorothiazide (MAXZIDE-25) 37.5-25 MG tablet Take 1 tablet by mouth daily.  . valsartan (DIOVAN) 80 MG tablet Take 1 tablet (80 mg total) by mouth daily.  . Wheat Dextrin (BENEFIBER) POWD Take by mouth 2 (two) times daily.   No facility-administered encounter medications on file as of 04/29/2018.       Medical History: Past Medical History:  Diagnosis Date  . Back pain    spinal stenosis  . Psoriasis    [name]  . Spinal  stenosis   . Thyroid disease      Vital Signs: BP (!) 144/92   Pulse 85   Temp 98.3 F (36.8 C) (Oral)   Resp 16   Ht 5\' 4"  (1.626 m)   Wt 180 lb (81.6 kg)   SpO2 97%   BMI 30.90 kg/m    Review of Systems  Constitutional: Negative for chills, fatigue and unexpected weight change.  HENT: Negative for congestion, rhinorrhea, sneezing and sore throat.   Eyes: Negative for photophobia, pain and redness.  Respiratory: Negative for cough, chest tightness and shortness of breath.   Cardiovascular: Negative for chest pain and palpitations.  Gastrointestinal: Negative for abdominal pain, constipation, diarrhea, nausea and vomiting.  Endocrine: Negative.   Genitourinary: Negative for dysuria and frequency.  Musculoskeletal: Negative for arthralgias, back pain, joint swelling and neck pain.  Skin: Positive for wound. Negative for rash.       Surgical puncture wound.  Left lower leg, medial aspect below knee.    Allergic/Immunologic: Negative.   Neurological: Negative for tremors and numbness.  Hematological: Negative for adenopathy. Does not bruise/bleed easily.  Psychiatric/Behavioral: Negative for behavioral problems and sleep disturbance. The patient is not nervous/anxious.     Physical Exam Vitals signs and nursing note reviewed.  Constitutional:      General: She is not in acute distress.  Appearance: She is well-developed. She is not diaphoretic.  HENT:     Head: Normocephalic and atraumatic.     Mouth/Throat:     Pharynx: No oropharyngeal exudate.  Eyes:     Pupils: Pupils are equal, round, and reactive to light.  Neck:     Musculoskeletal: Normal range of motion and neck supple.     Thyroid: No thyromegaly.     Vascular: No JVD.     Trachea: No tracheal deviation.  Cardiovascular:     Rate and Rhythm: Normal rate and regular rhythm.     Heart sounds: Normal heart sounds. No murmur. No friction rub. No gallop.   Pulmonary:     Effort: Pulmonary effort is normal.  No respiratory distress.     Breath sounds: Normal breath sounds. No wheezing or rales.  Chest:     Chest wall: No tenderness.  Abdominal:     Palpations: Abdomen is soft.     Tenderness: There is no abdominal tenderness. There is no guarding.  Musculoskeletal: Normal range of motion.     Comments: Left lower extremity wound, with drainage.  Foul smelling.  2cmx1cmx1cm  Lymphadenopathy:     Cervical: No cervical adenopathy.  Skin:    General: Skin is warm and dry.  Neurological:     Mental Status: She is alert and oriented to person, place, and time.     Cranial Nerves: No cranial nerve deficit.  Psychiatric:        Behavior: Behavior normal.        Thought Content: Thought content normal.        Judgment: Judgment normal.    Assessment/Plan: 1. Wound healing, delayed Patient's wound appears stable.  Does have some foul-smelling drainage.  However does not appear infected in the outside.  No redness or warmth noted.  Will refer patient to wound care due to the fact that the wound has not gotten smaller per patient. - AMB referral to wound care center  2. Essential hypertension, benign Patient's blood pressure slightly elevated at today's visit.  She reports that she has been taking her medicine and states that she has had a little bit of stress recently.  We will continue to follow at future visit.  General Counseling: [name] understanding of the findings of todays visit and agrees with plan of treatment. I have discussed any further diagnostic evaluation that may be needed or ordered today. We also reviewed her medications today. she has been encouraged to call the office with any questions or concerns that should arise related to todays visit.   Orders Placed This Encounter  Procedures  . AMB referral to wound care center    No orders of the defined types were placed in this encounter.   Time spent: 25 Minutes  This patient was seen by Orson Gear AGNP-C  in Collaboration with Dr [name] as a part of collaborative care agreement.  Kendell Bane AGNP-C Internal Medicine

## 2018-05-06 ENCOUNTER — Encounter: Payer: Managed Care, Other (non HMO) | Attending: Physician Assistant | Admitting: Physician Assistant

## 2018-05-06 DIAGNOSIS — Y838 Other surgical procedures as the cause of abnormal reaction of the patient, or of later complication, without mention of misadventure at the time of the procedure: Secondary | ICD-10-CM | POA: Diagnosis not present

## 2018-05-06 DIAGNOSIS — T8131XA Disruption of external operation (surgical) wound, not elsewhere classified, initial encounter: Secondary | ICD-10-CM | POA: Insufficient documentation

## 2018-05-06 DIAGNOSIS — L97822 Non-pressure chronic ulcer of other part of left lower leg with fat layer exposed: Secondary | ICD-10-CM | POA: Diagnosis not present

## 2018-05-07 NOTE — Progress Notes (Signed)
[name], [name] ([id number]) Visit Report for 05/06/2018 Abuse/Suicide Risk Screen Details Patient Name: [name], [name] Date of Service: 05/06/2018 1:00 PM Medical Record Number: [id number] Patient Account Number: [id number] Date of Birth/Sex: 24-Sep-1960 (57 y.o. Female) Treating RN: Montey Hora Primary Care Chiyeko Ferre: Clayborn Bigness Other Clinician: Referring Aveleen Nevers: Versie Starks, ADAM Treating Anothy Bufano/Extender: Melburn Hake, HOYT Weeks in Treatment: 0 Abuse/Suicide Risk Screen Items Answer ABUSE/SUICIDE RISK SCREEN: Has anyone close to you tried to hurt or harm you recentlyo No Do you feel uncomfortable with anyone in your familyo No Has anyone forced you do things that you didnot want to doo No Do you have any thoughts of harming yourselfo No Patient displays signs or symptoms of abuse and/or neglect. No Electronic Signature(s) Signed: 05/06/2018 4:55:08 PM By: Montey Hora Entered By: Montey Hora on 05/06/2018 13:19:57 [name], [name] ([id number]) -------------------------------------------------------------------------------- Activities of Daily Living Details Patient Name: [name] Date of Service: 05/06/2018 1:00 PM Medical Record Number: [id number] Patient Account Number: [id number] Date of Birth/Sex: September 01, 1960 (57 y.o. Female) Treating RN: Montey Hora Primary Care Ashritha Desrosiers: Clayborn Bigness Other Clinician: Referring Ciara Kagan: Versie Starks, ADAM Treating Loni Abdon/Extender: Melburn Hake, HOYT Weeks in Treatment: 0 Activities of Daily Living Items Answer Activities of Daily Living (Please select one for each item) Drive Automobile Completely Able Take Medications Completely Able Use Telephone Completely Able Care for Appearance Completely Able Use Toilet Completely Able Bath / Shower Completely Able Dress Self Completely Able Feed Self Completely Able Walk Completely Able Get In / Out Bed Completely Able Housework Completely Able Prepare Meals Completely  [place] for Self Completely Able Electronic Signature(s) Signed: 05/06/2018 4:55:08 PM By: Montey Hora Entered By: Montey Hora on 05/06/2018 13:20:16 [name], [name] ([id number]) -------------------------------------------------------------------------------- Education Assessment Details Patient Name: [name] Date of Service: 05/06/2018 1:00 PM Medical Record Number: [id number] Patient Account Number: [id number] Date of Birth/Sex: [birth year] (57 y.o. Female) Treating RN: Montey Hora Primary Care Athziri Freundlich: Clayborn Bigness Other Clinician: Referring Corky Blumstein: Versie Starks, ADAM Treating Buford Bremer/Extender: Melburn Hake, HOYT Weeks in Treatment: 0 Primary Learner Assessed: Patient Learning Preferences/Education Level/Primary Language Learning Preference: Explanation, Demonstration, Printed Material Highest Education Level: College or Above Preferred Language: English Cognitive Barrier Assessment/Beliefs Language Barrier: No Translator Needed: No Memory Deficit: No Emotional Barrier: No Cultural/Religious Beliefs Affecting Medical Care: No Physical Barrier Assessment Impaired Vision: No Impaired Hearing: No Decreased Hand dexterity: No Knowledge/Comprehension Assessment Knowledge Level: Medium Comprehension Level: Medium Ability to understand written Medium instructions: Ability to understand verbal Medium instructions: Motivation Assessment Anxiety Level: Calm Cooperation: Cooperative Education Importance: Acknowledges Need Interest in Health Problems: Asks Questions Perception: Coherent Willingness to Engage in Self- Medium Management Activities: Readiness to Engage in Self- Medium Management Activities: Electronic Signature(s) Signed: 05/06/2018 4:55:08 PM By: Montey Hora Entered By: Montey Hora on 05/06/2018 13:20:37 [name], [name]  ([id number]) -------------------------------------------------------------------------------- Fall Risk Assessment Details Patient Name: [name] Date of Service: 05/06/2018 1:00 PM Medical Record Number: [id number] Patient Account Number: [id number] Date of Birth/Sex: [birth year] (57 y.o. Female) Treating RN: Montey Hora Primary Care Kasai Beltran: Clayborn Bigness Other Clinician: Referring Cha Gomillion: Versie Starks, ADAM Treating Pearlie Nies/Extender: Melburn Hake, HOYT Weeks in Treatment: 0 Fall Risk Assessment Items Have you had 2 or more falls in the last 12 monthso 0 No Have you had any fall that resulted in injury in the last 12 monthso 0 No FALL RISK ASSESSMENT: History of falling - immediate or within 3 months 0 No Secondary diagnosis 0 No Ambulatory aid None/bed rest/wheelchair/nurse 0 Yes Crutches/cane/walker 0  No Furniture 0 No IV Access/Saline Lock 0 No Gait/Training Normal/bed rest/immobile 0 Yes Weak 0 No Impaired 0 No Mental Status Oriented to own ability 0 Yes Electronic Signature(s) Signed: 05/06/2018 4:55:08 PM By: Montey Hora Entered By: Montey Hora on 05/06/2018 13:20:46 [name], [name] ([id number]) -------------------------------------------------------------------------------- Foot Assessment Details Patient Name: [name] Date of Service: 05/06/2018 1:00 PM Medical Record Number: [id number] Patient Account Number: [id number] Date of Birth/Sex: 1960-07-24 (57 y.o. Female) Treating RN: Montey Hora Primary Care Kathleena Freeman: Clayborn Bigness Other Clinician: Referring Bailey Kolbe: Versie Starks, ADAM Treating Anais Koenen/Extender: Melburn Hake, HOYT Weeks in Treatment: 0 Foot Assessment Items Site Locations + = Sensation present, - = Sensation absent, C = Callus, U = Ulcer R = Redness, W = Warmth, M = Maceration, PU = Pre-ulcerative lesion F = Fissure, S = Swelling, D = Dryness Assessment Right: Left: Other Deformity: No No Prior Foot Ulcer: No No Prior  Amputation: No No Charcot Joint: No No Ambulatory Status: Ambulatory Without Help Gait: Steady Electronic Signature(s) Signed: 05/06/2018 4:55:08 PM By: Montey Hora Entered By: Montey Hora on 05/06/2018 13:23:55 [name], [name] ([id number]) -------------------------------------------------------------------------------- Nutrition Risk Assessment Details Patient Name: [name] Date of Service: 05/06/2018 1:00 PM Medical Record Number: [id number] Patient Account Number: [id number] Date of Birth/Sex: 01-05-1961 (57 y.o. Female) Treating RN: Montey Hora Primary Care Lunah Losasso: Clayborn Bigness Other Clinician: Referring Dynasty Holquin: Versie Starks, ADAM Treating Reanna Scoggin/Extender: Melburn Hake, HOYT Weeks in Treatment: 0 Height (in): 64 Weight (lbs): 178 Body Mass Index (BMI): 30.6 Nutrition Risk Assessment Items NUTRITION RISK SCREEN: I have an illness or condition that made me change the kind and/or amount of 0 No food I eat I eat fewer than two meals per day 0 No I eat few fruits and vegetables, or milk products 0 No I have three or more drinks of beer, liquor or wine almost every day 0 No I have tooth or mouth problems that make it hard for me to eat 0 No I don't always have enough money to buy the food I need 0 No I eat alone most of the time 0 No I take three or more different prescribed or over-the-counter drugs a day 0 No Without wanting to, I have lost or gained 10 pounds in the last six months 0 No I am not always physically able to shop, cook and/or feed myself 0 No Nutrition Protocols Good Risk Protocol 0 No interventions needed Moderate Risk Protocol Electronic Signature(s) Signed: 05/06/2018 4:55:08 PM By: Montey Hora Entered By: Montey Hora on 05/06/2018 13:20:52

## 2018-05-10 NOTE — Progress Notes (Signed)
[name] ([id number]) Visit Report for 05/06/2018 Chief Complaint Document Details Patient Name: [name] Date of Service: 05/06/2018 1:00 PM Medical Record Number: [id number] Patient Account Number: [id number] Date of Birth/Sex: 1961-01-22 (57 y.o. Female) Treating RN: Harold Barban Primary Care Provider: Clayborn Bigness Other Clinician: Referring Provider: Versie Starks, ADAM Treating Provider/Extender: Melburn Hake, HOYT Weeks in Treatment: 0 Information Obtained from: Patient Chief Complaint Left lower leg surgical ulcer Electronic Signature(s) Signed: 05/09/2018 10:16:39 PM By: Worthy Keeler PA-C Entered By: Worthy Keeler on 05/06/2018 13:37:32 [name] ([id number]) -------------------------------------------------------------------------------- Debridement Details Patient Name: [name] Date of Service: 05/06/2018 1:00 PM Medical Record Number: [id number] Patient Account Number: [id number] Date of Birth/Sex: 09/03/1960 (57 y.o. Female) Treating RN: Harold Barban Primary Care Provider: Clayborn Bigness Other Clinician: Referring Provider: Versie Starks, ADAM Treating Provider/Extender: Melburn Hake, HOYT Weeks in Treatment: 0 Debridement Performed for Wound #1 Left,Proximal,Medial Lower Leg Assessment: Performed By: Physician STONE III, HOYT E., PA-C Debridement Type: Debridement Level of Consciousness (Pre- Awake and Alert procedure): Pre-procedure Verification/Time Yes - 13:48 Out Taken: Start Time: 13:48 Pain Control: Lidocaine Total Area Debrided (L x W): 0.8 (cm) x 1.2 (cm) = 0.96 (cm) Tissue and other material Non-Viable, Slough, Subcutaneous, Slough debrided: Level: Skin/Subcutaneous Tissue Debridement Description: Excisional Instrument: Curette Bleeding: Minimum Hemostasis Achieved: Pressure End Time: 13:56 Procedural Pain: 0 Post Procedural Pain: 0 Response to Treatment: Procedure was tolerated well Level of Consciousness Awake  and Alert (Post-procedure): Post Debridement Measurements of Total Wound Length: (cm) 0.8 Width: (cm) 1.2 Depth: (cm) 1.2 Volume: (cm) 0.905 Character of Wound/Ulcer Post Debridement: Improved Post Procedure Diagnosis Same as Pre-procedure Electronic Signature(s) Signed: 05/07/2018 1:37:43 PM By: Harold Barban Signed: 05/09/2018 10:16:39 PM By: Worthy Keeler PA-C Entered By: Harold Barban on 05/06/2018 13:59:08 [name] ([id number]) -------------------------------------------------------------------------------- HPI Details Patient Name: [name] Date of Service: 05/06/2018 1:00 PM Medical Record Number: [id number] Patient Account Number: [id number] Date of Birth/Sex: 24-Feb-1961 (57 y.o. Female) Treating RN: Harold Barban Primary Care Provider: Clayborn Bigness Other Clinician: Referring Provider: Versie Starks, ADAM Treating Provider/Extender: Melburn Hake, HOYT Weeks in Treatment: 0 History of Present Illness HPI Description: 05/06/18 on evaluation today patient presents for initial inspection in our clinic concerning an issue that she has been having on her lower extremity as result of a initially biopsy and then subsequently excision of a precancerous lesion which was undertaken by dermatology on 04/02/18. I do not have the pathology report available today for my valuation unfortunately. Nonetheless we are gonna work on getting this from Dr. Elveria Rising office. The patient states that since this was removed and was quite deep sutures were attempted internally to help pull things together and then the area was pulled together utilizing Steri-Strips. Unfortunately it appears that the suture closure but Steri-Strips did not taken the patient is currently having issues with a dehisced wound where there is significant slough buildup internally. Nonetheless I do believe that this is something that may benefit from sharp debridement and good wound care at this time.  Fortunately there is no sign of infection currently. No fevers, chills, nausea, or vomiting noted at this time. The patient is here is a referral from her primary care provider. Electronic Signature(s) Signed: 05/09/2018 10:16:39 PM By: Worthy Keeler PA-C Entered By: Worthy Keeler on 05/09/2018 22:04:29 [name] ([id number]) -------------------------------------------------------------------------------- Physical Exam Details Patient Name: [name] Date of Service: 05/06/2018 1:00 PM Medical Record Number: [id number] Patient Account Number: [id number] Date of Birth/Sex: July 11, 1960 (  57 y.o. Female) Treating RN: Harold Barban Primary Care Provider: Clayborn Bigness Other Clinician: Referring Provider: Versie Starks, ADAM Treating Provider/Extender: STONE III, HOYT Weeks in Treatment: 0 Constitutional patient is hypertensive.. pulse regular and within target range for patient.[name] respirations regular, non-labored and within target range for patient.[name] temperature within target range for patient.. Well-nourished and well-hydrated in no acute distress. Eyes conjunctiva clear no eyelid edema noted. pupils equal round and reactive to light and accommodation. Ears, Nose, Mouth, and Throat no gross abnormality of ear auricles or external auditory canals. normal hearing noted during conversation. mucus membranes moist. Respiratory normal breathing without difficulty. clear to auscultation bilaterally. Cardiovascular regular rate and rhythm with normal S1, S2. 2+ dorsalis pedis/posterior tibialis pulses. no clubbing, cyanosis, significant edema, <3 sec cap refill. Gastrointestinal (GI) soft, non-tender, non-distended, +BS. no ventral hernia noted. Musculoskeletal normal gait and posture. no significant deformity or arthritic changes, no loss or range of motion, no clubbing. Psychiatric this patient is able to make decisions and demonstrates good insight into disease process. Alert  and Oriented x 3. pleasant and cooperative. Notes Upon evaluation the patient has two openings one large and another just to the side of it which is much smaller both of which connects internally. There's a small bridge of skin between the two externally. Nonetheless there does not appear to be any signs of tracking or tunneling into any other location other than these two locations. There is no evidence of purulent discharge which is good news. Sharp debridement was undertaken to clear away the slough from the surface of the wound which he tolerated today without complication. Post debridement everything appears to be doing significantly better which is great news. Electronic Signature(s) Signed: 05/09/2018 10:16:39 PM By: Worthy Keeler PA-C Entered By: Worthy Keeler on 05/09/2018 22:05:48 Shean, Mickel [name] ([id number]) -------------------------------------------------------------------------------- Physician Orders Details Patient Name: [name] Date of Service: 05/06/2018 1:00 PM Medical Record Number: [id number] Patient Account Number: [id number] Date of Birth/Sex: Mar 27, 1961 (57 y.o. Female) Treating RN: Harold Barban Primary Care Provider: Clayborn Bigness Other Clinician: Referring Provider: Versie Starks, ADAM Treating Provider/Extender: Melburn Hake, HOYT Weeks in Treatment: 0 Verbal / Phone Orders: No Diagnosis Coding ICD-10 Coding Code Description T81.31XA Disruption of external operation (surgical) wound, not elsewhere classified, initial encounter L97.822 Non-pressure chronic ulcer of other part of left lower leg with fat layer exposed Wound Cleansing o May Shower, gently pat wound dry prior to applying new dressing. - Clean wound with Dial antibacterial soap Primary Wound Dressing Wound #1 Left,Proximal,Medial Lower Leg o Silver Alginate Secondary Dressing Wound #1 Left,Proximal,Medial Lower Leg o Boardered Foam Dressing Dressing Change Frequency Wound #1  Left,Proximal,Medial Lower Leg o Change dressing every other day. Follow-up Appointments Wound #1 Left,Proximal,Medial Lower Leg o Return Appointment in 2 weeks. Electronic Signature(s) Signed: 05/07/2018 1:37:43 PM By: Harold Barban Signed: 05/09/2018 10:16:39 PM By: Worthy Keeler PA-C Entered By: Harold Barban on 05/06/2018 14:02:35 Manes, Mickel [name] ([id number]) -------------------------------------------------------------------------------- Problem List Details Patient Name: [name] Date of Service: 05/06/2018 1:00 PM Medical Record Number: [id number] Patient Account Number: [id number] Date of Birth/Sex: 1961/04/30 (57 y.o. Female) Treating RN: Harold Barban Primary Care Provider: Clayborn Bigness Other Clinician: Referring Provider: Versie Starks, ADAM Treating Provider/Extender: Melburn Hake, HOYT Weeks in Treatment: 0 Active Problems ICD-10 Evaluated Encounter Code Description Active Date Today Diagnosis T81.31XA Disruption of external operation (surgical) wound, not 05/06/2018 No Yes elsewhere classified, initial encounter L97.822 Non-pressure chronic ulcer of other part of left lower leg with 05/06/2018 No Yes fat  layer exposed Inactive Problems Resolved Problems Electronic Signature(s) Signed: 05/09/2018 10:16:39 PM By: Worthy Keeler PA-C Entered By: Worthy Keeler on 05/06/2018 13:37:05 Klaas, Mickel [name] ([id number]) -------------------------------------------------------------------------------- Progress Note Details Patient Name: [name] Date of Service: 05/06/2018 1:00 PM Medical Record Number: [id number] Patient Account Number: [id number] Date of Birth/Sex: 03-08-1961 (57 y.o. Female) Treating RN: Harold Barban Primary Care Provider: Clayborn Bigness Other Clinician: Referring Provider: Versie Starks, ADAM Treating Provider/Extender: Melburn Hake, HOYT Weeks in Treatment: 0 Subjective Chief Complaint Information obtained from Patient Left  lower leg surgical ulcer History of Present Illness (HPI) 05/06/18 on evaluation today patient presents for initial inspection in our clinic concerning an issue that she has been having on her lower extremity as result of a initially biopsy and then subsequently excision of a precancerous lesion which was undertaken by dermatology on 04/02/18. I do not have the pathology report available today for my valuation unfortunately. Nonetheless we are gonna work on getting this from Dr. Elveria Rising office. The patient states that since this was removed and was quite deep sutures were attempted internally to help pull things together and then the area was pulled together utilizing Steri-Strips. Unfortunately it appears that the suture closure but Steri-Strips did not taken the patient is currently having issues with a dehisced wound where there is significant slough buildup internally. Nonetheless I do believe that this is something that may benefit from sharp debridement and good wound care at this time. Fortunately there is no sign of infection currently. No fevers, chills, nausea, or vomiting noted at this time. The patient is here is a referral from her primary care provider. Wound History Patient presents with 1 open wound that has been present for approximately 5 weeks. Patient has been treating wound in the following manner: open to air. Laboratory tests have not been performed in the last month. Patient reportedly has not tested positive for an antibiotic resistant organism. Patient reportedly has not tested positive for osteomyelitis. Patient reportedly has not had testing performed to evaluate circulation in the legs. Patient History Information obtained from Patient. Allergies No Known Drug Allergies Family History Heart Disease - Father,Paternal Grandparents, Hypertension - Father,Paternal Grandparents, No family history of Cancer, Diabetes, Hereditary Spherocytosis, Kidney Disease, Lung  Disease, Seizures, Stroke, Thyroid Problems, Tuberculosis. Social History Never smoker, Marital Status - Married, Alcohol Use - Rarely, Drug Use - No History, Caffeine Use - Daily. Medical History Eyes Denies history of Cataracts, Glaucoma, Optic Neuritis Ear/Nose/Mouth/Throat Denies history of Chronic sinus problems/congestion, Middle ear problems Hematologic/Lymphatic Denies history of Anemia, Hemophilia, Human Immunodeficiency Virus, Lymphedema, Sickle Cell Disease Respiratory Denies history of Aspiration, Asthma, Chronic Obstructive Pulmonary Disease (COPD), Pneumothorax, Sleep Apnea, Beveridge, Carmellia L. ([id number]) Tuberculosis Cardiovascular Denies history of Angina, Arrhythmia, Congestive Heart Failure, Coronary Artery Disease, Deep Vein Thrombosis, Hypertension, Hypotension, Myocardial Infarction, Peripheral Arterial Disease, Peripheral Venous Disease, Phlebitis, Vasculitis Gastrointestinal Denies history of Cirrhosis , Colitis, Crohn s, Hepatitis A, Hepatitis B, Hepatitis C Endocrine Denies history of Type I Diabetes, Type II Diabetes Genitourinary Denies history of End Stage Renal Disease Immunological Denies history of Lupus Erythematosus, Raynaud s, Scleroderma Integumentary (Skin) Denies history of History of Burn, History of pressure wounds Musculoskeletal Denies history of Gout, Rheumatoid Arthritis, Osteoarthritis, Osteomyelitis Neurologic Denies history of Dementia, Neuropathy, Quadriplegia, Paraplegia, Seizure Disorder Oncologic Denies history of Received Chemotherapy, Received Radiation Psychiatric Denies history of Anorexia/bulimia, Confinement Anxiety Medical And Surgical History Notes Cardiovascular laser vein treatments and scleratherapy 2019 Musculoskeletal psoriasis Review of Systems (ROS) Constitutional Symptoms (General  Health) Denies complaints or symptoms of Fatigue, Fever, Chills, Marked Weight Change. Eyes Denies complaints or symptoms of  Dry Eyes, Vision Changes, Glasses / Contacts. Ear/Nose/Mouth/Throat Denies complaints or symptoms of Difficult clearing ears, Sinusitis. Hematologic/Lymphatic Denies complaints or symptoms of Bleeding / Clotting Disorders, Human Immunodeficiency Virus. Respiratory Denies complaints or symptoms of Chronic or frequent coughs, Shortness of Breath. Cardiovascular Denies complaints or symptoms of Chest pain, LE edema. Gastrointestinal Denies complaints or symptoms of Frequent diarrhea, Nausea, Vomiting. Endocrine Complains or has symptoms of Thyroid disease. Denies complaints or symptoms of Hepatitis, Polydypsia (Excessive Thirst). Genitourinary Denies complaints or symptoms of Kidney failure/ Dialysis, Incontinence/dribbling. Immunological Denies complaints or symptoms of Hives, Itching. Integumentary (Skin) Complains or has symptoms of Wounds. Denies complaints or symptoms of Bleeding or bruising tendency, Breakdown, Swelling. Musculoskeletal Denies complaints or symptoms of Muscle Pain, Muscle Weakness. Neurologic Denies complaints or symptoms of Numbness/parasthesias, Focal/Weakness. Psychiatric Carbo, Hays ([id number]) Denies complaints or symptoms of Anxiety, Claustrophobia. Objective Constitutional patient is hypertensive.. pulse regular and within target range for patient.[name] respirations regular, non-labored and within target range for patient.[name] temperature within target range for patient.. Well-nourished and well-hydrated in no acute distress. Vitals Time Taken: 1:11 PM, Height: 64 in, Source: Measured, Weight: 178 lbs, Source: Measured, BMI: 30.6, Temperature: 98.3 F, Pulse: 68 bpm, Respiratory Rate: 16 breaths/min, Blood Pressure: 149/86 mmHg. Eyes conjunctiva clear no eyelid edema noted. pupils equal round and reactive to light and accommodation. Ears, Nose, Mouth, and Throat no gross abnormality of ear auricles or external auditory canals. normal hearing noted  during conversation. mucus membranes moist. Respiratory normal breathing without difficulty. clear to auscultation bilaterally. Cardiovascular regular rate and rhythm with normal S1, S2. 2+ dorsalis pedis/posterior tibialis pulses. no clubbing, cyanosis, significant edema, Gastrointestinal (GI) soft, non-tender, non-distended, +BS. no ventral hernia noted. Musculoskeletal normal gait and posture. no significant deformity or arthritic changes, no loss or range of motion, no clubbing. Psychiatric this patient is able to make decisions and demonstrates good insight into disease process. Alert and Oriented x 3. pleasant and cooperative. General Notes: Upon evaluation the patient has two openings one large and another just to the side of it which is much smaller both of which connects internally. There's a small bridge of skin between the two externally. Nonetheless there does not appear to be any signs of tracking or tunneling into any other location other than these two locations. There is no evidence of purulent discharge which is good news. Sharp debridement was undertaken to clear away the slough from the surface of the wound which he tolerated today without complication. Post debridement everything appears to be doing significantly better which is great news. Integumentary (Hair, Skin) Wound #1 status is Open. Original cause of wound was Surgical Injury. The wound is located on the Left,Proximal,Medial Lower Leg. The wound measures 0.8cm length x 1.2cm width x 1cm depth; 0.754cm^2 area and 0.754cm^3 volume. There is Fat Layer (Subcutaneous Tissue) Exposed exposed. There is no tunneling or undermining noted. There is a medium amount of serous drainage noted. The wound margin is flat and intact. There is small (1-33%) pink granulation within the wound bed. There is a large (67-100%) amount of necrotic tissue within the wound bed including Eschar and Adherent Slough. The periwound skin  appearance exhibited: Erythema. The periwound skin appearance did not exhibit: Callus, Crepitus, Excoriation, Induration, Rash, Scarring, Dry/Scaly, Maceration, Atrophie Blanche, Cyanosis, Ecchymosis, Hemosiderin Penson, Geana L. ([id number]) Staining, Mottled, Pallor, Rubor. The surrounding wound skin color is noted with erythema  which is circumferential. Periwound temperature was noted as No Abnormality. The periwound has tenderness on palpation. Assessment Active Problems ICD-10 Disruption of external operation (surgical) wound, not elsewhere classified, initial encounter Non-pressure chronic ulcer of other part of left lower leg with fat layer exposed Procedures Wound #1 Pre-procedure diagnosis of Wound #1 is an Open Surgical Wound located on the Left,Proximal,Medial Lower Leg . There was a Excisional Skin/Subcutaneous Tissue Debridement with a total area of 0.96 sq cm performed by STONE III, HOYT E., PA-C. With the following instrument(s): Curette to remove Non-Viable tissue/material. Material removed includes Subcutaneous Tissue and Slough and after achieving pain control using Lidocaine. No specimens were taken. A time out was conducted at 13:48, prior to the start of the procedure. A Minimum amount of bleeding was controlled with Pressure. The procedure was tolerated well with a pain level of 0 throughout and a pain level of 0 following the procedure. Post Debridement Measurements: 0.8cm length x 1.2cm width x 1.2cm depth; 0.905cm^3 volume. Character of Wound/Ulcer Post Debridement is improved. Post procedure Diagnosis Wound #1: Same as Pre-Procedure Plan Wound Cleansing: May Shower, gently pat wound dry prior to applying new dressing. - Clean wound with Dial antibacterial soap Primary Wound Dressing: Wound #1 Left,Proximal,Medial Lower Leg: Silver Alginate Secondary Dressing: Wound #1 Left,Proximal,Medial Lower Leg: Boardered Foam Dressing Dressing Change Frequency: Wound #1  Left,Proximal,Medial Lower Leg: Change dressing every other day. Follow-up Appointments: Wound #1 Left,Proximal,Medial Lower Leg: Return Appointment in 2 weeks. Mario, Eriko L. ([id number]) I'm gonna suggest that we initiate the above wound care measures for the next week at this point. The patient is in agreement with the plan. We will need to continue to pack the wound well and I think with appropriate packing this could heal. Nonetheless I did suggest that we may want to consider doing a snap vac for her in which case I would want to cut the bridging area of skin so that this wound could be completely packed and appropriately treated with a snap vac. We will check on the approval for this. She's in agreement that plan. If anything changes or worsens in the meantime she will contact the office and let me know. Otherwise will see were things stand in two weeks and whether or not we get approval and or need to proceed with the Wound VAC therapy. Please see above for specific wound care orders. We will see patient for re-evaluation in 1 week(s) here in the clinic. If anything worsens or changes patient will contact our office for additional recommendations. Electronic Signature(s) Signed: 05/09/2018 10:16:39 PM By: Worthy Keeler PA-C Entered By: Worthy Keeler on 05/09/2018 22:06:13 Gillaspie, Mickel [name] ([id number]) -------------------------------------------------------------------------------- ROS/PFSH Details Patient Name: [name] Date of Service: 05/06/2018 1:00 PM Medical Record Number: [id number] Patient Account Number: [id number] Date of Birth/Sex: 1961/03/18 (57 y.o. Female) Treating RN: Montey Hora Primary Care Provider: Clayborn Bigness Other Clinician: Referring Provider: Versie Starks, ADAM Treating Provider/Extender: Melburn Hake, HOYT Weeks in Treatment: 0 Information Obtained From Patient Wound History Do you currently have one or more open woundso Yes How many open  wounds do you currently haveo 1 Approximately how long have you had your woundso 5 weeks How have you been treating your wound(s) until nowo open to air Has your wound(s) ever healed and then re-openedo No Have you had any lab work done in the past montho No Have you tested positive for an antibiotic resistant organism (MRSA, VRE)o No Have you tested positive for osteomyelitis (bone  infection)o No Have you had any tests for circulation on your legso No Constitutional Symptoms (General Health) Complaints and Symptoms: Negative for: Fatigue; Fever; Chills; Marked Weight Change Eyes Complaints and Symptoms: Negative for: Dry Eyes; Vision Changes; Glasses / Contacts Medical History: Negative for: Cataracts; Glaucoma; Optic Neuritis Ear/Nose/Mouth/Throat Complaints and Symptoms: Negative for: Difficult clearing ears; Sinusitis Medical History: Negative for: Chronic sinus problems/congestion; Middle ear problems Hematologic/Lymphatic Complaints and Symptoms: Negative for: Bleeding / Clotting Disorders; Human Immunodeficiency Virus Medical History: Negative for: Anemia; Hemophilia; Human Immunodeficiency Virus; Lymphedema; Sickle Cell Disease Respiratory Complaints and Symptoms: Negative for: Chronic or frequent coughs; Shortness of Breath Medical History: Negative for: Aspiration; Asthma; Chronic Obstructive Pulmonary Disease (COPD); Pneumothorax; Sleep Apnea; Heinkel, Deven L. ([id number]) Tuberculosis Cardiovascular Complaints and Symptoms: Negative for: Chest pain; LE edema Medical History: Negative for: Angina; Arrhythmia; Congestive Heart Failure; Coronary Artery Disease; Deep Vein Thrombosis; Hypertension; Hypotension; Myocardial Infarction; Peripheral Arterial Disease; Peripheral Venous Disease; Phlebitis; Vasculitis Past Medical History Notes: laser vein treatments and scleratherapy 2019 Gastrointestinal Complaints and Symptoms: Negative for: Frequent diarrhea; Nausea;  Vomiting Medical History: Negative for: Cirrhosis ; Colitis; Crohnos; Hepatitis A; Hepatitis B; Hepatitis C Endocrine Complaints and Symptoms: Positive for: Thyroid disease Negative for: Hepatitis; Polydypsia (Excessive Thirst) Medical History: Negative for: Type I Diabetes; Type II Diabetes Genitourinary Complaints and Symptoms: Negative for: Kidney failure/ Dialysis; Incontinence/dribbling Medical History: Negative for: End Stage Renal Disease Immunological Complaints and Symptoms: Negative for: Hives; Itching Medical History: Negative for: Lupus Erythematosus; Raynaudos; Scleroderma Integumentary (Skin) Complaints and Symptoms: Positive for: Wounds Negative for: Bleeding or bruising tendency; Breakdown; Swelling Medical History: Negative for: History of Burn; History of pressure wounds Musculoskeletal Complaints and Symptoms: Negative for: Muscle Pain; Muscle Weakness Shirah, Nautika L. ([id number]) Medical History: Negative for: Gout; Rheumatoid Arthritis; Osteoarthritis; Osteomyelitis Past Medical History Notes: psoriasis Neurologic Complaints and Symptoms: Negative for: Numbness/parasthesias; Focal/Weakness Medical History: Negative for: Dementia; Neuropathy; Quadriplegia; Paraplegia; Seizure Disorder Psychiatric Complaints and Symptoms: Negative for: Anxiety; Claustrophobia Medical History: Negative for: Anorexia/bulimia; Confinement Anxiety Oncologic Medical History: Negative for: Received Chemotherapy; Received Radiation Immunizations Pneumococcal Vaccine: Received Pneumococcal Vaccination: No Immunization Notes: up to date Implantable Devices Family and Social History Cancer: No; Diabetes: No; Heart Disease: Yes - Father,Paternal Grandparents; Hereditary Spherocytosis: No; Hypertension: Yes - Father,Paternal Grandparents; Kidney Disease: No; Lung Disease: No; Seizures: No; Stroke: No; Thyroid Problems: No; Tuberculosis: No; Never smoker; Marital Status  - Married; Alcohol Use: Rarely; Drug Use: No History; Caffeine Use: Daily; Financial Concerns: No; Food, Clothing or Shelter Needs: No; Support System Lacking: No; Transportation Concerns: No; Advanced Directives: No; Patient does not want information on Advanced Directives Electronic Signature(s) Signed: 05/06/2018 4:55:08 PM By: Montey Hora Signed: 05/09/2018 10:16:39 PM By: Worthy Keeler PA-C Entered By: Montey Hora on 05/06/2018 13:19:45 Smouse, Mickel [name] ([id number]) -------------------------------------------------------------------------------- SuperBill Details Patient Name: [name] Date of Service: 05/06/2018 Medical Record Number: [id number] Patient Account Number: [id number] Date of Birth/Sex: Aug 18, 1960 (57 y.o. Female) Treating RN: Harold Barban Primary Care Provider: Clayborn Bigness Other Clinician: Referring Provider: Versie Starks, ADAM Treating Provider/Extender: Melburn Hake, HOYT Weeks in Treatment: 0 Diagnosis Coding ICD-10 Codes Code Description T81.31XA Disruption of external operation (surgical) wound, not elsewhere classified, initial encounter L97.822 Non-pressure chronic ulcer of other part of left lower leg with fat layer exposed Facility Procedures CPT4 Code Description: 32671245 99213 - WOUND CARE VISIT-LEV 3 EST PT Modifier: Quantity: 1 CPT4 Code Description: 80998338 11042 - DEB SUBQ TISSUE 20 SQ CM/< ICD-10 Diagnosis Description L97.822 Non-pressure chronic ulcer of other  part of left lower leg with Modifier: fat layer exposed Quantity: 1 Physician Procedures CPT4: Description Modifier Quantity Code [id number] WC PHYS LEVEL 3 o NEW PT 25 1 ICD-10 Diagnosis Description T81.31XA Disruption of external operation (surgical) wound, not elsewhere classified, initial encounter L75.300 Non-pressure chronic ulcer of  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

## 2018-05-10 NOTE — Progress Notes (Signed)
DENISHIA, CITRO ([id number]) Visit Report for 05/06/2018 Allergy List Details Patient Name: JENESYS, CASSEUS Date of Service: 05/06/2018 1:00 PM Medical Record Number: [id number] Patient Account Number: [id number] Date of Birth/Sex: [birth year] (57 y.o. Female) Treating RN: Montey Hora Primary Care Drelyn Pistilli: Clayborn Bigness Other Clinician: Referring Lataya Varnell: Versie Starks, ADAM Treating Yojan Paskett/Extender: STONE III, HOYT Weeks in Treatment: 0 Allergies Active Allergies No Known Drug Allergies Allergy Notes Electronic Signature(s) Signed: 05/06/2018 4:55:08 PM By: Montey Hora Entered By: Montey Hora on 05/06/2018 13:11:19 Cavanah, [name] ([id number]) -------------------------------------------------------------------------------- Arrival Information Details Patient Name: [name] Date of Service: 05/06/2018 1:00 PM Medical Record Number: [id number] Patient Account Number: [id number] Date of Birth/Sex: 01-May-1961 (57 y.o. Female) Treating RN: Montey Hora Primary Care Nikko Goldwire: Clayborn Bigness Other Clinician: Referring Reinhart Saulters: Versie Starks, ADAM Treating Michal Strzelecki/Extender: Melburn Hake, HOYT Weeks in Treatment: 0 Visit Information Patient Arrived: Ambulatory Arrival Time: 13:10 Accompanied By: self Transfer Assistance: None Patient Identification Verified: Yes Secondary Verification Process Completed: Yes Electronic Signature(s) Signed: 05/06/2018 4:55:08 PM By: Montey Hora Entered By: Montey Hora on 05/06/2018 13:11:05 Laughter, [name] ([id number]) -------------------------------------------------------------------------------- Clinic Level of Care Assessment Details Patient Name: [name] Date of Service: 05/06/2018 1:00 PM Medical Record Number: [id number] Patient Account Number: [id number] Date of Birth/Sex: [birth year] (57 y.o. Female) Treating RN: Harold Barban Primary Care Jimya Ciani: Clayborn Bigness Other Clinician: Referring Ethel Meisenheimer:  Versie Starks, ADAM Treating Xandra Laramee/Extender: Melburn Hake, HOYT Weeks in Treatment: 0 Clinic Level of Care Assessment Items TOOL 1 Quantity Score []  - Use when EandM and Procedure is performed on INITIAL visit 0 ASSESSMENTS - Nursing Assessment / Reassessment X - General Physical Exam (combine w/ comprehensive assessment (listed just below) when 1 20 performed on new pt. evals) X- 1 25 Comprehensive Assessment (HX, ROS, Risk Assessments, Wounds Hx, etc.) ASSESSMENTS - Wound and Skin Assessment / Reassessment []  - Dermatologic / Skin Assessment (not related to wound area) 0 ASSESSMENTS - Ostomy and/or Continence Assessment and Care []  - Incontinence Assessment and Management 0 []  - 0 Ostomy Care Assessment and Management (repouching, etc.) PROCESS - Coordination of Care X - Simple Patient / Family Education for ongoing care 1 15 []  - 0 Complex (extensive) Patient / Family Education for ongoing care X- 1 10 Staff obtains Programmer, systems, Records, Test Results / Process Orders []  - 0 Staff telephones HHA, Nursing Homes / Clarify orders / etc []  - 0 Routine Transfer to another Facility (non-emergent condition) []  - 0 Routine Hospital Admission (non-emergent condition) X- 1 15 New Admissions / Biomedical engineer / Ordering NPWT, Apligraf, etc. []  - 0 Emergency Hospital Admission (emergent condition) PROCESS - Special Needs []  - Pediatric / Minor Patient Management 0 []  - 0 Isolation Patient Management []  - 0 Hearing / Language / Visual special needs []  - 0 Assessment of Community assistance (transportation, D/C planning, etc.) []  - 0 Additional assistance / Altered mentation []  - 0 Support Surface(s) Assessment (bed, cushion, seat, etc.) Broady, Seini L. ([id number]) INTERVENTIONS - Miscellaneous []  - External ear exam 0 []  - 0 Patient Transfer (multiple staff / Civil Service fast streamer / Similar devices) []  - 0 Simple Staple / Suture removal (25 or less) []  - 0 Complex Staple / Suture  removal (26 or more) []  - 0 Hypo/Hyperglycemic Management (do not check if billed separately) X- 1 15 Ankle / Brachial Index (ABI) - do not check if billed separately Has the patient been seen at the hospital within the last three years: Yes Total Score: 100 Level Of Care: New/Established - Level 3  Electronic Signature(s) Signed: 05/07/2018 1:37:43 PM By: Harold Barban Entered By: Harold Barban on 05/06/2018 14:03:23 Dahir, [name] ([id number]) -------------------------------------------------------------------------------- Encounter Discharge Information Details Patient Name: [name] Date of Service: 05/06/2018 1:00 PM Medical Record Number: [id number] Patient Account Number: [id number] Date of Birth/Sex: 12-25-1960 (57 y.o. Female) Treating RN: Cornell Barman Primary Care Salah Burlison: Clayborn Bigness Other Clinician: Referring Jadynn Epping: Versie Starks, ADAM Treating Demarr Kluever/Extender: Melburn Hake, HOYT Weeks in Treatment: 0 Encounter Discharge Information Items Post Procedure Vitals Discharge Condition: Stable Temperature (F): 98.3 Ambulatory Status: Ambulatory Pulse (bpm): 68 Discharge Destination: Home Respiratory Rate (breaths/min): 16 Transportation: Private Auto Blood Pressure (mmHg): 148/86 Accompanied By: self Schedule Follow-up Appointment: Yes Clinical Summary of Care: Electronic Signature(s) Signed: 05/07/2018 1:27:23 PM By: Gretta Cool, BSN, RN, CWS, Kim RN, BSN Entered By: Gretta Cool, BSN, RN, CWS, Kim on 05/06/2018 14:12:22 Garris, [name] ([id number]) -------------------------------------------------------------------------------- Lower Extremity Assessment Details Patient Name: [name] Date of Service: 05/06/2018 1:00 PM Medical Record Number: [id number] Patient Account Number: [id number] Date of Birth/Sex: 02/11/1961 (57 y.o. Female) Treating RN: Montey Hora Primary Care Naeem Quillin: Clayborn Bigness Other Clinician: Referring Anjanette Gilkey: Versie Starks,  ADAM Treating Johnattan Strassman/Extender: Melburn Hake, HOYT Weeks in Treatment: 0 Edema Assessment Assessed: [Left: No] [Right: No] [Left: Edema] [Right: :] Calf Left: Right: Point of Measurement: 32 cm From Medial Instep 39 cm cm Ankle Left: Right: Point of Measurement: 11 cm From Medial Instep 20.6 cm cm Vascular Assessment Pulses: Dorsalis Pedis Palpable: [Left:Yes] Doppler Audible: [Left:Yes] Posterior Tibial Palpable: [Left:Yes] Doppler Audible: [Left:Yes] Extremity colors, hair growth, and conditions: Extremity Color: [Left:Hyperpigmented] Hair Growth on Extremity: [Left:No] Temperature of Extremity: [Left:Warm] Capillary Refill: [Left:< 3 seconds] Blood Pressure: Brachial: [Left:160] Dorsalis Pedis: 155 [Left:Dorsalis Pedis:] Ankle: Posterior Tibial: 172 [Left:Posterior Tibial: 1.08] Toe Nail Assessment Left: Right: Thick: Yes Discolored: No Deformed: No Improper Length and Hygiene: No Electronic Signature(s) Signed: 05/06/2018 4:55:08 PM By: Montey Hora Entered By: Montey Hora on 05/06/2018 13:29:34 Deblois, [name] ([id number]) Saidi, [name] ([id number]) -------------------------------------------------------------------------------- Multi Wound Chart Details Patient Name: [name] Date of Service: 05/06/2018 1:00 PM Medical Record Number: [id number] Patient Account Number: [id number] Date of Birth/Sex: [birth year] (57 y.o. Female) Treating RN: Harold Barban Primary Care Zamiya Dillard: Clayborn Bigness Other Clinician: Referring Gaberial Cada: Versie Starks, ADAM Treating Kaliah Haddaway/Extender: Melburn Hake, HOYT Weeks in Treatment: 0 Vital Signs Height(in): 64 Pulse(bpm): 66 Weight(lbs): 178 Blood Pressure(mmHg): 149/86 Body Mass Index(BMI): 31 Temperature(F): 98.3 Respiratory Rate 16 (breaths/min): Photos: [1:No Photos] [N/A:N/A] Wound Location: [1:Left Lower Leg - Medial, Proximal] [N/A:N/A] Wounding Event: [1:Surgical Injury] [N/A:N/A] Primary Etiology:  [1:Open Surgical Wound] [N/A:N/A] Date Acquired: [1:04/02/2018] [N/A:N/A] Weeks of Treatment: [1:0] [N/A:N/A] Wound Status: [1:Open] [N/A:N/A] Measurements L x W x D [1:0.8x1.2x1] [N/A:N/A] (cm) Area (cm) : [1:0.754] [N/A:N/A] Volume (cm) : [1:0.754] [N/A:N/A] Classification: [1:Full Thickness Without Exposed Support Structures] [N/A:N/A] Exudate Amount: [1:Medium] [N/A:N/A] Exudate Type: [1:Serous] [N/A:N/A] Exudate Color: [1:amber] [N/A:N/A] Wound Margin: [1:Flat and Intact] [N/A:N/A] Granulation Amount: [1:Small (1-33%)] [N/A:N/A] Granulation Quality: [1:Pink] [N/A:N/A] Necrotic Amount: [1:Large (67-100%)] [N/A:N/A] Necrotic Tissue: [1:Eschar, Adherent Slough] [N/A:N/A] Exposed Structures: [1:Fat Layer (Subcutaneous Tissue) Exposed: Yes Fascia: No Tendon: No Muscle: No Joint: No Bone: No] [N/A:N/A] Epithelialization: [1:None] [N/A:N/A] Periwound Skin Texture: [1:Excoriation: No Induration: No Callus: No Crepitus: No Rash: No Scarring: No] [N/A:N/A] Periwound Skin Moisture: [N/A:N/A] Maceration: No Dry/Scaly: No Periwound Skin Color: Erythema: Yes N/A N/A Atrophie Blanche: No Cyanosis: No Ecchymosis: No Hemosiderin Staining: No Mottled: No Pallor: No Rubor: No Erythema Location: Circumferential N/A N/A Temperature: No Abnormality N/A N/A Tenderness on Palpation: Yes  N/A N/A Wound Preparation: Ulcer Cleansing: N/A N/A Rinsed/Irrigated with Saline Topical Anesthetic Applied: Other: lidocaine 4% Treatment Notes Electronic Signature(s) Signed: 05/07/2018 1:37:43 PM By: Harold Barban Entered By: Harold Barban on 05/06/2018 13:43:44 Radle, [name] ([id number]) -------------------------------------------------------------------------------- [place] Details Patient Name: [name] Date of Service: 05/06/2018 1:00 PM Medical Record Number: [id number] Patient Account Number: [id number] Date of Birth/Sex: 06/08/1960 (57 y.o.  Female) Treating RN: Harold Barban Primary Care Dylan Ruotolo: Clayborn Bigness Other Clinician: Referring Darrall Strey: Versie Starks, ADAM Treating Kerim Statzer/Extender: Melburn Hake, HOYT Weeks in Treatment: 0 Active Inactive Wound/Skin Impairment Nursing Diagnoses: Knowledge deficit related to ulceration/compromised skin integrity Goals: Ulcer/skin breakdown will have a volume reduction of 30% by week 4 Date Initiated: 05/06/2018 Target Resolution Date: 06/06/2018 Goal Status: Active Interventions: Assess patient/caregiver ability to obtain necessary supplies Assess patient/caregiver ability to perform ulcer/skin care regimen upon admission and as needed Assess ulceration(s) every visit Notes: Electronic Signature(s) Signed: 05/07/2018 1:37:43 PM By: Harold Barban Entered By: Harold Barban on 05/06/2018 13:42:42 Hitchman, [name] ([id number]) -------------------------------------------------------------------------------- Pain Assessment Details Patient Name: [name] Date of Service: 05/06/2018 1:00 PM Medical Record Number: [id number] Patient Account Number: [id number] Date of Birth/Sex: August 30, 1960 (57 y.o. Female) Treating RN: Montey Hora Primary Care Witney Huie: Clayborn Bigness Other Clinician: Referring Kristee Angus: Versie Starks, ADAM Treating Lorelie Biermann/Extender: Melburn Hake, HOYT Weeks in Treatment: 0 Active Problems Location of Pain Severity and Description of Pain Patient Has Paino Yes Site Locations Pain Location: Pain in Ulcers With Dressing Change: Yes Duration of the Pain. Constant / Intermittento Intermittent Pain Management and Medication Current Pain Management: Goals for Pain Management sore at times Electronic Signature(s) Signed: 05/06/2018 4:55:08 PM By: Montey Hora Entered By: Montey Hora on 05/06/2018 13:11:41 Winrow, [name] ([id number]) -------------------------------------------------------------------------------- Patient/Caregiver Education  Details Patient Name: [name] Date of Service: 05/06/2018 1:00 PM Medical Record Number: [id number] Patient Account Number: [id number] Date of Birth/Gender: 1960/08/31 (57 y.o. Female) Treating RN: Harold Barban Primary Care Physician: Clayborn Bigness Other Clinician: Referring Physician: Versie Starks, ADAM Treating Physician/Extender: Sharalyn Ink in Treatment: 0 Education Assessment Education Provided To: Patient Education Topics Provided Wound/Skin Impairment: Handouts: Caring for Your Ulcer Methods: Demonstration, Explain/Verbal Responses: State content correctly Electronic Signature(s) Signed: 05/07/2018 1:37:43 PM By: Harold Barban Entered By: Harold Barban on 05/06/2018 13:44:04 [place], [name] ([id number]) -------------------------------------------------------------------------------- Wound Assessment Details Patient Name: [name] Date of Service: 05/06/2018 1:00 PM Medical Record Number: [id number] Patient Account Number: [id number] Date of Birth/Sex: 1960-12-03 (57 y.o. Female) Treating RN: Montey Hora Primary Care Nobuo Nunziata: Clayborn Bigness Other Clinician: Referring Havish Petties: Versie Starks, ADAM Treating Annie Roseboom/Extender: Melburn Hake, HOYT Weeks in Treatment: 0 Wound Status Wound Number: 1 Primary Etiology: Open Surgical Wound Wound Location: Left Lower Leg - Medial, Proximal Wound Status: Open Wounding Event: Surgical Injury Date Acquired: 04/02/2018 Weeks Of Treatment: 0 Clustered Wound: No Photos Photo Uploaded By: Montey Hora on 05/06/2018 14:21:00 Wound Measurements Length: (cm) 0.8 Width: (cm) 1.2 Depth: (cm) 1 Area: (cm) 0.754 Volume: (cm) 0.754 % Reduction in Area: % Reduction in Volume: Epithelialization: None Tunneling: No Undermining: No Wound Description Full Thickness Without Exposed Support Classification: Structures Wound Margin: Flat and Intact Exudate Medium Amount: Exudate Type: Serous Exudate  Color: amber Foul Odor After Cleansing: No Slough/Fibrino Yes Wound Bed Granulation Amount: Small (1-33%) Exposed Structure Granulation Quality: Pink Fascia Exposed: No Necrotic Amount: Large (67-100%) Fat Layer (Subcutaneous Tissue) Exposed: Yes Necrotic Quality: Eschar, Adherent Slough Tendon Exposed: No Muscle Exposed: No Joint Exposed: No Bone Exposed: No Scaff, Shanique L. (  [id number]) Periwound Skin Texture Texture Color No Abnormalities Noted: No No Abnormalities Noted: No Callus: No Atrophie Blanche: No Crepitus: No Cyanosis: No Excoriation: No Ecchymosis: No Induration: No Erythema: Yes Rash: No Erythema Location: Circumferential Scarring: No Hemosiderin Staining: No Mottled: No Moisture Pallor: No No Abnormalities Noted: No Rubor: No Dry / Scaly: No Maceration: No Temperature / Pain Temperature: No Abnormality Tenderness on Palpation: Yes Wound Preparation Ulcer Cleansing: Rinsed/Irrigated with Saline Topical Anesthetic Applied: Other: lidocaine 4%, Treatment Notes Wound #1 (Left, Proximal, Medial Lower Leg) Notes Silver cell and bordered foam dressing Electronic Signature(s) Signed: 05/06/2018 4:55:08 PM By: Montey Hora Entered By: Montey Hora on 05/06/2018 13:26:49 Knieriem, [name] ([id number]) -------------------------------------------------------------------------------- [place] Details Patient Name: [name] Date of Service: 05/06/2018 1:00 PM Medical Record Number: [id number] Patient Account Number: [id number] Date of Birth/Sex: Sep 26, 1960 (57 y.o. Female) Treating RN: Montey Hora Primary Care Dolly Harbach: Clayborn Bigness Other Clinician: Referring Rogue Pautler: Versie Starks, ADAM Treating Rashada Klontz/Extender: Melburn Hake, HOYT Weeks in Treatment: 0 Vital Signs Time Taken: 13:11 Temperature (F): 98.3 Height (in): 64 Pulse (bpm): 68 Source: Measured Respiratory Rate (breaths/min): 16 Weight (lbs): 178 Blood Pressure (mmHg):  149/86 Source: Measured Reference Range: 80 - 120 mg / dl Body Mass Index (BMI): 30.6 Electronic Signature(s) Signed: 05/06/2018 4:55:08 PM By: Montey Hora Entered By: Montey Hora on 05/06/2018 13:13:56

## 2018-05-20 ENCOUNTER — Encounter: Payer: Managed Care, Other (non HMO) | Attending: Physician Assistant | Admitting: Physician Assistant

## 2018-05-20 DIAGNOSIS — L97822 Non-pressure chronic ulcer of other part of left lower leg with fat layer exposed: Secondary | ICD-10-CM | POA: Diagnosis present

## 2018-05-21 ENCOUNTER — Encounter: Payer: Self-pay | Admitting: Internal Medicine

## 2018-05-24 NOTE — Progress Notes (Signed)
[name], [name] ([id number]) Visit Report for 05/20/2018 Arrival Information Details Patient Name: [name], [name] Date of Service: 05/20/2018 8:00 AM Medical Record Number: [id number] Patient Account Number: [id number] Date of Birth/Sex: 09/04/1960 (58 y.o. F) Treating RN: Harold Barban Primary Care Carson Meche: Clayborn Bigness Other Clinician: Referring Tadarius Maland: Clayborn Bigness Treating Sharada Albornoz/Extender: Melburn Hake, HOYT Weeks in Treatment: 2 Visit Information History Since Last Visit Added or deleted any medications: No Patient Arrived: Ambulatory Any new allergies or adverse reactions: No Arrival Time: 08:08 Had a fall or experienced change in No Accompanied By: self activities of daily living that may affect Transfer Assistance: None risk of falls: Patient Identification Verified: Yes Signs or symptoms of abuse/neglect since last visito No Secondary Verification Process Completed: Yes Hospitalized since last visit: No Implantable device outside of the clinic excluding No cellular tissue based products placed in the center since last visit: Has Dressing in Place as Prescribed: Yes Pain Present Now: No Electronic Signature(s) Signed: 05/20/2018 3:59:13 PM By: Lorine Bears RCP, RRT, CHT Entered By: Lorine Bears on 05/20/2018 08:08:48 [place], [name] ([id number]) -------------------------------------------------------------------------------- Encounter Discharge Information Details Patient Name: [name] Date of Service: 05/20/2018 8:00 AM Medical Record Number: [id number] Patient Account Number: [id number] Date of Birth/Sex: 15-Feb-1961 (58 y.o. F) Treating RN: Harold Barban Primary Care Jalia Zuniga: Clayborn Bigness Other Clinician: Referring Krystena Reitter: Clayborn Bigness Treating Tinsley Everman/Extender: Melburn Hake, HOYT Weeks in Treatment: 2 Encounter Discharge Information Items Post Procedure Vitals Discharge Condition: Stable Temperature (F):  98.1 Ambulatory Status: Ambulatory Pulse (bpm): 76 Discharge Destination: Home Respiratory Rate (breaths/min): 16 Transportation: Private Auto Blood Pressure (mmHg): 121/71 Accompanied By: self Schedule Follow-up Appointment: Yes Clinical Summary of Care: Electronic Signature(s) Signed: 05/23/2018 2:41:13 PM By: Harold Barban Entered By: Harold Barban on 05/20/2018 08:59:42 Garry, [name] ([id number]) -------------------------------------------------------------------------------- Lower Extremity Assessment Details Patient Name: [name] Date of Service: 05/20/2018 8:00 AM Medical Record Number: [id number] Patient Account Number: [id number] Date of Birth/Sex: 15-Feb-1961 (58 y.o. F) Treating RN: Montey Hora Primary Care Yitzel Shasteen: Clayborn Bigness Other Clinician: Referring Rosario Kushner: Clayborn Bigness Treating Jona Erkkila/Extender: Melburn Hake, HOYT Weeks in Treatment: 2 Vascular Assessment Pulses: Dorsalis Pedis Palpable: [Left:Yes] Posterior Tibial Extremity colors, hair growth, and conditions: Extremity Color: [Left:Hyperpigmented] Hair Growth on Extremity: [Left:Yes] Temperature of Extremity: [Left:Warm] Capillary Refill: [Left:< 3 seconds] Toe Nail Assessment Left: Right: Thick: Yes Discolored: No Deformed: No Improper Length and Hygiene: No Electronic Signature(s) Signed: 05/20/2018 5:30:25 PM By: Montey Hora Entered By: Montey Hora on 05/20/2018 08:26:50 [name], [name] ([id number]) -------------------------------------------------------------------------------- Multi Wound Chart Details Patient Name: [name] Date of Service: 05/20/2018 8:00 AM Medical Record Number: [id number] Patient Account Number: [id number] Date of Birth/Sex: 07/11/1960 (58 y.o. F) Treating RN: Harold Barban Primary Care Cella Cappello: Clayborn Bigness Other Clinician: Referring Rinoa Garramone: Clayborn Bigness Treating Nyan Dufresne/Extender: Melburn Hake, HOYT Weeks in Treatment: 2 Vital  Signs Height(in): 64 Pulse(bpm): 76 Weight(lbs): 178 Blood Pressure(mmHg): 121/71 Body Mass Index(BMI): 31 Temperature(F): 98.1 Respiratory Rate 16 (breaths/min): Photos: [1:No Photos] [N/A:N/A] Wound Location: [1:Left Lower Leg - Medial, Proximal] [N/A:N/A] Wounding Event: [1:Surgical Injury] [N/A:N/A] Primary Etiology: [1:Open Surgical Wound] [N/A:N/A] Date Acquired: [1:04/02/2018] [N/A:N/A] Weeks of Treatment: [1:2] [N/A:N/A] Wound Status: [1:Open] [N/A:N/A] Measurements L x W x D [1:0.8x1.4x0.6] [N/A:N/A] (cm) Area (cm) : [1:0.88] [N/A:N/A] Volume (cm) : [1:0.528] [N/A:N/A] % Reduction in Area: [1:-16.70%] [N/A:N/A] % Reduction in Volume: [1:30.00%] [N/A:N/A] Classification: [1:Full Thickness Without Exposed Support Structures] [N/A:N/A] Exudate Amount: [1:Medium] [N/A:N/A] Exudate Type: [1:Serous] [N/A:N/A] Exudate Color: [1:amber] [N/A:N/A] Wound Margin: [1:Flat and  Intact] [N/A:N/A] Granulation Amount: [1:Small (1-33%)] [N/A:N/A] Granulation Quality: [1:Pink] [N/A:N/A] Necrotic Amount: [1:Large (67-100%)] [N/A:N/A] Exposed Structures: [1:Fat Layer (Subcutaneous Tissue) Exposed: Yes Fascia: No Tendon: No Muscle: No Joint: No Bone: No] [N/A:N/A] Epithelialization: [1:None] [N/A:N/A] Periwound Skin Texture: [1:Excoriation: No Induration: No Callus: No Crepitus: No Rash: No Scarring: No] [N/A:N/A] Periwound Skin Moisture: Maceration: No N/A N/A Dry/Scaly: No Periwound Skin Color: Erythema: Yes N/A N/A Atrophie Blanche: No Cyanosis: No Ecchymosis: No Hemosiderin Staining: No Mottled: No Pallor: No Rubor: No Erythema Location: Circumferential N/A N/A Temperature: No Abnormality N/A N/A Tenderness on Palpation: Yes N/A N/A Wound Preparation: Ulcer Cleansing: N/A N/A Rinsed/Irrigated with Saline Topical Anesthetic Applied: Other: lidocaine 4% Treatment Notes Electronic Signature(s) Signed: 05/23/2018 2:41:13 PM By: Harold Barban Entered By: Harold Barban on 05/20/2018 08:39:51 [place], [name] ([id number]) -------------------------------------------------------------------------------- [place] Details Patient Name: [name] Date of Service: 05/20/2018 8:00 AM Medical Record Number: [id number] Patient Account Number: [id number] Date of Birth/Sex: November 16, 1960 (57 y.o. F) Treating RN: Harold Barban Primary Care Lambros Cerro: Clayborn Bigness Other Clinician: Referring Khadar Monger: Clayborn Bigness Treating Keelia Graybill/Extender: Melburn Hake, HOYT Weeks in Treatment: 2 Active Inactive Wound/Skin Impairment Nursing Diagnoses: Knowledge deficit related to ulceration/compromised skin integrity Goals: Ulcer/skin breakdown will have a volume reduction of 30% by week 4 Date Initiated: 05/06/2018 Target Resolution Date: 06/06/2018 Goal Status: Active Interventions: Assess patient/caregiver ability to obtain necessary supplies Assess patient/caregiver ability to perform ulcer/skin care regimen upon admission and as needed Assess ulceration(s) every visit Notes: Electronic Signature(s) Signed: 05/23/2018 2:41:13 PM By: Harold Barban Entered By: Harold Barban on 05/20/2018 08:39:43 Sarkisyan, [name] ([id number]) -------------------------------------------------------------------------------- Pain Assessment Details Patient Name: [name] Date of Service: 05/20/2018 8:00 AM Medical Record Number: [id number] Patient Account Number: [id number] Date of Birth/Sex: 19-Jun-1960 (58 y.o. F) Treating RN: Harold Barban Primary Care Jazmene Racz: Clayborn Bigness Other Clinician: Referring Nathalee Smarr: Clayborn Bigness Treating Daruis Swaim/Extender: Melburn Hake, HOYT Weeks in Treatment: 2 Active Problems Location of Pain Severity and Description of Pain Patient Has Paino No Site Locations Pain Management and Medication Current Pain Management: Electronic Signature(s) Signed: 05/20/2018 3:59:13 PM By: Lorine Bears RCP, RRT,  CHT Signed: 05/23/2018 2:41:13 PM By: Harold Barban Entered By: Lorine Bears on 05/20/2018 08:08:56 [place], [name] ([id number]) -------------------------------------------------------------------------------- Patient/Caregiver Education Details Patient Name: [name] Date of Service: 05/20/2018 8:00 AM Medical Record Number: [id number] Patient Account Number: [id number] Date of Birth/Gender: 14-Aug-1960 (58 y.o. F) Treating RN: Harold Barban Primary Care Physician: Clayborn Bigness Other Clinician: Referring Physician: Clayborn Bigness Treating Physician/Extender: Sharalyn Ink in Treatment: 2 Education Assessment Education Provided To: Patient Education Topics Provided Electronic Signature(s) Signed: 05/23/2018 2:41:13 PM By: Harold Barban Entered By: Harold Barban on 05/20/2018 08:59:46 [place], [name] ([id number]) -------------------------------------------------------------------------------- Wound Assessment Details Patient Name: [name] Date of Service: 05/20/2018 8:00 AM Medical Record Number: [id number] Patient Account Number: [id number] Date of Birth/Sex: [birth year] (58 y.o. F) Treating RN: Montey Hora Primary Care Kaileia Flow: Clayborn Bigness Other Clinician: Referring Winnifred Dufford: Clayborn Bigness Treating Oliana Gowens/Extender: Melburn Hake, HOYT Weeks in Treatment: 2 Wound Status Wound Number: 1 Primary Etiology: Open Surgical Wound Wound Location: Left Lower Leg - Medial, Proximal Wound Status: Open Wounding Event: Surgical Injury Date Acquired: 04/02/2018 Weeks Of Treatment: 2 Clustered Wound: No Photos Photo Uploaded By: Montey Hora on 05/20/2018 14:26:41 Wound Measurements Length: (cm) 0.8 Width: (cm) 1.4 Depth: (cm) 0.6 Area: (cm) 0.88 Volume: (cm) 0.528 % Reduction in Area: -16.7% % Reduction in Volume: 30% Epithelialization: None Tunneling: No Undermining:  No Wound Description Full Thickness Without Exposed  Support Classification: Structures Wound Margin: Flat and Intact Exudate Medium Amount: Exudate Type: Serous Exudate Color: amber Foul Odor After Cleansing: No Slough/Fibrino Yes Wound Bed Granulation Amount: Small (1-33%) Exposed Structure Granulation Quality: Pink Fascia Exposed: No Necrotic Amount: Large (67-100%) Fat Layer (Subcutaneous Tissue) Exposed: Yes Necrotic Quality: Adherent Slough Tendon Exposed: No Muscle Exposed: No Joint Exposed: No Bone Exposed: No Stock, Laquesha L. ([id number]) Periwound Skin Texture Texture Color No Abnormalities Noted: No No Abnormalities Noted: No Callus: No Atrophie Blanche: No Crepitus: No Cyanosis: No Excoriation: No Ecchymosis: No Induration: No Erythema: Yes Rash: No Erythema Location: Circumferential Scarring: No Hemosiderin Staining: No Mottled: No Moisture Pallor: No No Abnormalities Noted: No Rubor: No Dry / Scaly: No Maceration: No Temperature / Pain Temperature: No Abnormality Tenderness on Palpation: Yes Wound Preparation Ulcer Cleansing: Rinsed/Irrigated with Saline Topical Anesthetic Applied: Other: lidocaine 4%, Treatment Notes Wound #1 (Left, Proximal, Medial Lower Leg) Notes Silver collagen to wound bed, apply alginate on top, secure with BFD. Electronic Signature(s) Signed: 05/20/2018 5:30:25 PM By: Montey Hora Entered By: Montey Hora on 05/20/2018 08:26:26 [place], [name] ([id number]) -------------------------------------------------------------------------------- [place] Details Patient Name: [name] Date of Service: 05/20/2018 8:00 AM Medical Record Number: [id number] Patient Account Number: [id number] Date of Birth/Sex: 09-Feb-1961 (57 y.o. F) Treating RN: Harold Barban Primary Care Brealynn Contino: Clayborn Bigness Other Clinician: Referring Wynonia Medero: Clayborn Bigness Treating Leatha Rohner/Extender: Melburn Hake, HOYT Weeks in Treatment: 2 Vital Signs Time Taken: 08:08 Temperature (F):  98.1 Height (in): 64 Pulse (bpm): 76 Weight (lbs): 178 Respiratory Rate (breaths/min): 16 Body Mass Index (BMI): 30.6 Blood Pressure (mmHg): 121/71 Reference Range: 80 - 120 mg / dl Electronic Signature(s) Signed: 05/20/2018 3:59:13 PM By: Lorine Bears RCP, RRT, CHT Entered By: Lorine Bears on 05/20/2018 08:12:35

## 2018-05-24 NOTE — Progress Notes (Signed)
ZAKYLA, TONCHE ([id number]) Visit Report for 05/20/2018 Chief Complaint Document Details Patient Name: [name], [name] Date of Service: 05/20/2018 8:00 AM Medical Record Number: [id number] Patient Account Number: [id number] Date of Birth/Sex: 1960-11-25 (57 y.o. F) Treating RN: Harold Barban Primary Care Provider: Clayborn Bigness Other Clinician: Referring Provider: Clayborn Bigness Treating Provider/Extender: Melburn Hake, Siearra Amberg Weeks in Treatment: 2 Information Obtained from: Patient Chief Complaint Left lower leg surgical ulcer Electronic Signature(s) Signed: 05/21/2018 7:11:38 AM By: Worthy Keeler PA-C Entered By: Worthy Keeler on 05/20/2018 08:35:53 [name], [name] ([id number]) -------------------------------------------------------------------------------- Debridement Details Patient Name: [name] Date of Service: 05/20/2018 8:00 AM Medical Record Number: [id number] Patient Account Number: [id number] Date of Birth/Sex: [birth year] (57 y.o. F) Treating RN: Harold Barban Primary Care Provider: Clayborn Bigness Other Clinician: Referring Provider: Clayborn Bigness Treating Provider/Extender: Melburn Hake, Khalid Lacko Weeks in Treatment: 2 Debridement Performed for Wound #1 Left,Proximal,Medial Lower Leg Assessment: Performed By: Physician STONE III, Alexandros Ewan E., PA-C Debridement Type: Debridement Level of Consciousness (Pre- Awake and Alert procedure): Pre-procedure Verification/Time Yes - 08:45 Out Taken: Start Time: 08:45 Pain Control: Lidocaine Injectable : 2 Total Area Debrided (L x W): 0.8 (cm) x 1.4 (cm) = 1.12 (cm) Tissue and other material Viable, Non-Viable, Slough, Subcutaneous, Skin: Epidermis, Slough debrided: Level: Skin/Subcutaneous Tissue Debridement Description: Excisional Instrument: Curette, Forceps, Scissors Bleeding: Minimum Hemostasis Achieved: Pressure End Time: 08:51 Procedural Pain: 0 Post Procedural Pain: 0 Response to Treatment: Procedure was tolerated  well Level of Consciousness Awake and Alert (Post-procedure): Post Debridement Measurements of Total Wound Length: (cm) 1 Width: (cm) 2 Depth: (cm) 0.5 Volume: (cm) 0.785 Character of Wound/Ulcer Post Debridement: Improved Post Procedure Diagnosis Same as Pre-procedure Electronic Signature(s) Signed: 05/21/2018 7:11:38 AM By: Worthy Keeler PA-C Signed: 05/23/2018 2:41:13 PM By: Harold Barban Entered By: Harold Barban on 05/20/2018 08:56:32 [place], [name] ([id number]) -------------------------------------------------------------------------------- HPI Details Patient Name: [name] Date of Service: 05/20/2018 8:00 AM Medical Record Number: [id number] Patient Account Number: [id number] Date of Birth/Sex: 11/19/1960 (58 y.o. F) Treating RN: Harold Barban Primary Care Provider: Clayborn Bigness Other Clinician: Referring Provider: Clayborn Bigness Treating Provider/Extender: Melburn Hake, Nathanyal Ashmead Weeks in Treatment: 2 History of Present Illness HPI Description: 05/06/18 on evaluation today patient presents for initial inspection in our clinic concerning an issue that she has been having on her lower extremity as result of a initially biopsy and then subsequently excision of a precancerous lesion which was undertaken by dermatology on 04/02/18. I do not have the pathology report available today for my valuation unfortunately. Nonetheless we are gonna work on getting this from Dr. Elveria Rising office. The patient states that since this was removed and was quite deep sutures were attempted internally to help pull things together and then the area was pulled together utilizing Steri-Strips. Unfortunately it appears that the suture closure but Steri-Strips did not taken the patient is currently having issues with a dehisced wound where there is significant slough buildup internally. Nonetheless I do believe that this is something that may benefit from sharp debridement and good wound care at  this time. Fortunately there is no sign of infection currently. No fevers, chills, nausea, or vomiting noted at this time. The patient is here is a referral from her primary care provider. 05/20/18 on evaluation today patient actually appears to be doing rather well in regard to her lower extremity ulcer. The area of skin that was connecting the small opening to the larger main opening is actually for me now as I expected  it likely would. This likely means that we're gonna need to go ahead and see about trimming this area off which I think is the most perfect and help this area to heal appropriately. The patient is in agreement with this plan today. Other than that there's no sign of infection she seems to be doing very well. Electronic Signature(s) Signed: 05/21/2018 7:11:38 AM By: Worthy Keeler PA-C Entered By: Worthy Keeler on 05/20/2018 09:12:25 [place], [name] ([id number]) -------------------------------------------------------------------------------- Physical Exam Details Patient Name: [name] Date of Service: 05/20/2018 8:00 AM Medical Record Number: [id number] Patient Account Number: [id number] Date of Birth/Sex: 08/29/1960 (57 y.o. F) Treating RN: Harold Barban Primary Care Provider: Clayborn Bigness Other Clinician: Referring Provider: Clayborn Bigness Treating Provider/Extender: STONE III, Eudelia Hiltunen Weeks in Treatment: 2 Constitutional Well-nourished and well-hydrated in no acute distress. Respiratory normal breathing without difficulty. Psychiatric this patient is able to make decisions and demonstrates good insight into disease process. Alert and Oriented x 3. pleasant and cooperative. Notes Patient's wound bed currently shows signs of good granulation there was some Slough noted which require sharp debridement today. I did also trim off the connecting thin area of skin the room with him bleeding as I used scissors and forceps to remove this region. Post debridement the  wound bed appears to be doing much better which is excellent news. We are still checking on the snapback approval. Electronic Signature(s) Signed: 05/21/2018 7:11:38 AM By: Worthy Keeler PA-C Entered By: Worthy Keeler on 05/20/2018 09:13:06 [place], [name] ([id number]) -------------------------------------------------------------------------------- Physician Orders Details Patient Name: [name] Date of Service: 05/20/2018 8:00 AM Medical Record Number: [id number] Patient Account Number: [id number] Date of Birth/Sex: 07-23-1960 (57 y.o. F) Treating RN: Harold Barban Primary Care Provider: Clayborn Bigness Other Clinician: Referring Provider: Clayborn Bigness Treating Provider/Extender: Melburn Hake, Hollyanne Schloesser Weeks in Treatment: 2 Verbal / Phone Orders: No Diagnosis Coding ICD-10 Coding Code Description T81.31XA Disruption of external operation (surgical) wound, not elsewhere classified, initial encounter L97.822 Non-pressure chronic ulcer of other part of left lower leg with fat layer exposed Wound Cleansing o May Shower, gently pat wound dry prior to applying Stacey dressing. - Clean wound with Dial antibacterial soap Primary Wound Dressing Wound #1 Left,Proximal,Medial Lower Leg o Alginate o Silver Collagen - Place collagen directly to wound bed, place alginate on top Secondary Dressing Wound #1 Left,Proximal,Medial Lower Leg o Boardered Foam Dressing Dressing Change Frequency Wound #1 Left,Proximal,Medial Lower Leg o Change dressing every other day. Follow-up Appointments Wound #1 Left,Proximal,Medial Lower Leg o Return Appointment in 1 week. Electronic Signature(s) Signed: 05/21/2018 7:11:38 AM By: Worthy Keeler PA-C Signed: 05/23/2018 2:41:13 PM By: Harold Barban Entered By: Harold Barban on 05/20/2018 08:55:19 [name], [name] ([id number]) -------------------------------------------------------------------------------- Problem List Details Patient Name:  [name] Date of Service: 05/20/2018 8:00 AM Medical Record Number: [id number] Patient Account Number: [id number] Date of Birth/Sex: 1961-01-16 (57 y.o. F) Treating RN: Harold Barban Primary Care Provider: Clayborn Bigness Other Clinician: Referring Provider: Clayborn Bigness Treating Provider/Extender: Melburn Hake, Calen Geister Weeks in Treatment: 2 Active Problems ICD-10 Evaluated Encounter Code Description Active Date Today Diagnosis T81.31XA Disruption of external operation (surgical) wound, not 05/06/2018 No Yes elsewhere classified, initial encounter L97.822 Non-pressure chronic ulcer of other part of left lower leg with 05/06/2018 No Yes fat layer exposed Inactive Problems Resolved Problems Electronic Signature(s) Signed: 05/21/2018 7:11:38 AM By: Worthy Keeler PA-C Entered By: Worthy Keeler on 05/20/2018 08:35:46 [name], [name] ([id number]) -------------------------------------------------------------------------------- Progress Note Details Patient Name:  [name], Stacey L. Date of Service: 05/20/2018 8:00 AM Medical Record Number: [id number] Patient Account Number: [id number] Date of Birth/Sex: July 28, 1960 (58 y.o. F) Treating RN: Harold Barban Primary Care Provider: Clayborn Bigness Other Clinician: Referring Provider: Clayborn Bigness Treating Provider/Extender: Melburn Hake, Kaelea Gathright Weeks in Treatment: 2 Subjective Chief Complaint Information obtained from Patient Left lower leg surgical ulcer History of Present Illness (HPI) 05/06/18 on evaluation today patient presents for initial inspection in our clinic concerning an issue that she has been having on her lower extremity as result of a initially biopsy and then subsequently excision of a precancerous lesion which was undertaken by dermatology on 04/02/18. I do not have the pathology report available today for my valuation unfortunately. Nonetheless we are gonna work on getting this from Dr. Elveria Rising office. The patient states that  since this was removed and was quite deep sutures were attempted internally to help pull things together and then the area was pulled together utilizing Steri-Strips. Unfortunately it appears that the suture closure but Steri-Strips did not taken the patient is currently having issues with a dehisced wound where there is significant slough buildup internally. Nonetheless I do believe that this is something that may benefit from sharp debridement and good wound care at this time. Fortunately there is no sign of infection currently. No fevers, chills, nausea, or vomiting noted at this time. The patient is here is a referral from her primary care provider. 05/20/18 on evaluation today patient actually appears to be doing rather well in regard to her lower extremity ulcer. The area of skin that was connecting the small opening to the larger main opening is actually for me now as I expected it likely would. This likely means that we're gonna need to go ahead and see about trimming this area off which I think is the most perfect and help this area to heal appropriately. The patient is in agreement with this plan today. Other than that there's no sign of infection she seems to be doing very well. Patient History Information obtained from Patient. Family History Heart Disease - Father,Paternal Grandparents, Hypertension - Father,Paternal Grandparents, No family history of Cancer, Diabetes, Hereditary Spherocytosis, Kidney Disease, Lung Disease, Seizures, Stroke, Thyroid Problems, Tuberculosis. Social History Never smoker, Marital Status - Married, Alcohol Use - Rarely, Drug Use - No History, Caffeine Use - Daily. Medical And Surgical History Notes Cardiovascular laser vein treatments and scleratherapy 2019 Musculoskeletal psoriasis Review of Systems (ROS) Constitutional Symptoms (General Health) Denies complaints or symptoms of Fever, Chills. Respiratory The patient has no complaints or  symptoms. Cardiovascular The patient has no complaints or symptoms. [name], Stacey L. ([id number]) Psychiatric The patient has no complaints or symptoms. Objective Constitutional Well-nourished and well-hydrated in no acute distress. Vitals Time Taken: 8:08 AM, Height: 64 in, Weight: 178 lbs, BMI: 30.6, Temperature: 98.1 F, Pulse: 76 bpm, Respiratory Rate: 16 breaths/min, Blood Pressure: 121/71 mmHg. Respiratory normal breathing without difficulty. Psychiatric this patient is able to make decisions and demonstrates good insight into disease process. Alert and Oriented x 3. pleasant and cooperative. General Notes: Patient's wound bed currently shows signs of good granulation there was some Slough noted which require sharp debridement today. I did also trim off the connecting thin area of skin the room with him bleeding as I used scissors and forceps to remove this region. Post debridement the wound bed appears to be doing much better which is excellent news. We are still checking on the snapback approval. Integumentary (Hair, Skin) Wound #1 status is  Open. Original cause of wound was Surgical Injury. The wound is located on the Left,Proximal,Medial Lower Leg. The wound measures 0.8cm length x 1.4cm width x 0.6cm depth; 0.88cm^2 area and 0.528cm^3 volume. There is Fat Layer (Subcutaneous Tissue) Exposed exposed. There is no tunneling or undermining noted. There is a medium amount of serous drainage noted. The wound margin is flat and intact. There is small (1-33%) pink granulation within the wound bed. There is a large (67-100%) amount of necrotic tissue within the wound bed including Adherent Slough. The periwound skin appearance exhibited: Erythema. The periwound skin appearance did not exhibit: Callus, Crepitus, Excoriation, Induration, Rash, Scarring, Dry/Scaly, Maceration, Atrophie Blanche, Cyanosis, Ecchymosis, Hemosiderin Staining, Mottled, Pallor, Rubor. The surrounding wound skin  color is noted with erythema which is circumferential. Periwound temperature was noted as No Abnormality. The periwound has tenderness on palpation. Assessment Active Problems ICD-10 Disruption of external operation (surgical) wound, not elsewhere classified, initial encounter Non-pressure chronic ulcer of other part of left lower leg with fat layer exposed [name], [name] ([id number]) Procedures Wound #1 Pre-procedure diagnosis of Wound #1 is an Open Surgical Wound located on the Left,Proximal,Medial Lower Leg . There was a Excisional Skin/Subcutaneous Tissue Debridement with a total area of 1.12 sq cm performed by STONE III, Earlyn Sylvan E., PA-C. With the following instrument(s): Curette, Forceps, and Scissors to remove Viable and Non-Viable tissue/material. Material removed includes Subcutaneous Tissue, Slough, and Skin: Epidermis after achieving pain control using Lidocaine Injectable: 2%. A time out was conducted at 08:45, prior to the start of the procedure. A Minimum amount of bleeding was controlled with Pressure. The procedure was tolerated well with a pain level of 0 throughout and a pain level of 0 following the procedure. Post Debridement Measurements: 1cm length x 2cm width x 0.5cm depth; 0.785cm^3 volume. Character of Wound/Ulcer Post Debridement is improved. Post procedure Diagnosis Wound #1: Same as Pre-Procedure Plan Wound Cleansing: May Shower, gently pat wound dry prior to applying Stacey dressing. - Clean wound with Dial antibacterial soap Primary Wound Dressing: Wound #1 Left,Proximal,Medial Lower Leg: Alginate Silver Collagen - Place collagen directly to wound bed, place alginate on top Secondary Dressing: Wound #1 Left,Proximal,Medial Lower Leg: Boardered Foam Dressing Dressing Change Frequency: Wound #1 Left,Proximal,Medial Lower Leg: Change dressing every other day. Follow-up Appointments: Wound #1 Left,Proximal,Medial Lower Leg: Return Appointment in 1 week. At  this point my suggestion is gonna be that we have the collagen to the base of the wound and then subsequently we will see were things stand at follow-up. She is in agreement with plan. If we get approval from [employer] we will initiate that next week. She is also in agreement with that. Please see above for specific wound care orders. We will see patient for re-evaluation in 1 week(s) here in the clinic. If anything worsens or changes patient will contact our office for additional recommendations. Electronic Signature(s) Signed: 05/21/2018 7:11:38 AM By: Worthy Keeler PA-C Entered By: Worthy Keeler on 05/20/2018 09:14:34 [name], [name] ([id number]) -------------------------------------------------------------------------------- ROS/PFSH Details Patient Name: [name] Date of Service: 05/20/2018 8:00 AM Medical Record Number: [id number] Patient Account Number: [id number] Date of Birth/Sex: 03/01/1961 (57 y.o. F) Treating RN: Harold Barban Primary Care Provider: Clayborn Bigness Other Clinician: Referring Provider: Clayborn Bigness Treating Provider/Extender: Melburn Hake, Xolani Degracia Weeks in Treatment: 2 Information Obtained From Patient Wound History Do you currently have one or more open woundso Yes How many open wounds do you currently haveo 1 Approximately how long have you had  your woundso 5 weeks How have you been treating your wound(s) until nowo open to air Has your wound(s) ever healed and then re-openedo No Have you had any lab work done in the past montho No Have you tested positive for an antibiotic resistant organism (MRSA, VRE)o No Have you tested positive for osteomyelitis (bone infection)o No Have you had any tests for circulation on your legso No Constitutional Symptoms (General Health) Complaints and Symptoms: Negative for: Fever; Chills Eyes Medical History: Negative for: Cataracts; Glaucoma; Optic Neuritis Ear/Nose/Mouth/Throat Medical History: Negative for:  Chronic sinus problems/congestion; Middle ear problems Hematologic/Lymphatic Medical History: Negative for: Anemia; Hemophilia; Human Immunodeficiency Virus; Lymphedema; Sickle Cell Disease Respiratory Complaints and Symptoms: No Complaints or Symptoms Medical History: Negative for: Aspiration; Asthma; Chronic Obstructive Pulmonary Disease (COPD); Pneumothorax; Sleep Apnea; Tuberculosis Cardiovascular Complaints and Symptoms: No Complaints or Symptoms Medical History: Negative for: Angina; Arrhythmia; Congestive Heart Failure; Coronary Artery Disease; Deep Vein Thrombosis; Nobbe, Yvonna L. ([id number]) Hypertension; Hypotension; Myocardial Infarction; Peripheral Arterial Disease; Peripheral Venous Disease; Phlebitis; Vasculitis Past Medical History Notes: laser vein treatments and scleratherapy 2019 Gastrointestinal Medical History: Negative for: Cirrhosis ; Colitis; Crohnos; Hepatitis A; Hepatitis B; Hepatitis C Endocrine Medical History: Negative for: Type I Diabetes; Type II Diabetes Genitourinary Medical History: Negative for: End Stage Renal Disease Immunological Medical History: Negative for: Lupus Erythematosus; Raynaudos; Scleroderma Integumentary (Skin) Medical History: Negative for: History of Burn; History of pressure wounds Musculoskeletal Medical History: Negative for: Gout; Rheumatoid Arthritis; Osteoarthritis; Osteomyelitis Past Medical History Notes: psoriasis Neurologic Medical History: Negative for: Dementia; Neuropathy; Quadriplegia; Paraplegia; Seizure Disorder Oncologic Medical History: Negative for: Received Chemotherapy; Received Radiation Psychiatric Complaints and Symptoms: No Complaints or Symptoms Medical History: Negative for: Anorexia/bulimia; Confinement Anxiety Immunizations Pneumococcal Vaccine: Received Pneumococcal Vaccination: No Immunization Notes: [name], Stacey L. ([id number]) up to date Implantable Devices Family and  Social History Cancer: No; Diabetes: No; Heart Disease: Yes - Father,Paternal Grandparents; Hereditary Spherocytosis: No; Hypertension: Yes - Father,Paternal Grandparents; Kidney Disease: No; Lung Disease: No; Seizures: No; Stroke: No; Thyroid Problems: No; Tuberculosis: No; Never smoker; Marital Status - Married; Alcohol Use: Rarely; Drug Use: No History; Caffeine Use: Daily; Financial Concerns: No; Food, Clothing or Shelter Needs: No; Support System Lacking: No; Transportation Concerns: No; Advanced Directives: No; Patient does not want information on Advanced Directives Physician Affirmation I have reviewed and agree with the above information. Electronic Signature(s) Signed: 05/21/2018 7:11:38 AM By: Worthy Keeler PA-C Signed: 05/23/2018 2:41:13 PM By: Harold Barban Entered By: Worthy Keeler on 05/20/2018 09:12:42 [name], [name] ([id number]) -------------------------------------------------------------------------------- SuperBill Details Patient Name: [name] Date of Service: 05/20/2018 Medical Record Number: [id number] Patient Account Number: [id number] Date of Birth/Sex: Mar 25, 1961 (58 y.o. F) Treating RN: Harold Barban Primary Care Provider: Clayborn Bigness Other Clinician: Referring Provider: Clayborn Bigness Treating Provider/Extender: Melburn Hake, Rosslyn Pasion Weeks in Treatment: 2 Diagnosis Coding ICD-10 Codes Code Description T81.31XA Disruption of external operation (surgical) wound, not elsewhere classified, initial encounter L97.822 Non-pressure chronic ulcer of other part of left lower leg with fat layer exposed Facility Procedures CPT4 Code Description: [id number] 11042 - DEB SUBQ TISSUE 20 SQ CM/< ICD-10 Diagnosis Description L97.822 Non-pressure chronic ulcer of other part of left lower leg with Modifier: fat layer exposed Quantity: 1 Physician Procedures CPT4 Code Description: [id number] 11042 - WC PHYS SUBQ TISS 20 SQ CM ICD-10 Diagnosis Description L97.822 Non-pressure  chronic ulcer of other part of left lower leg with Modifier: fat layer exposed Quantity: 1 Electronic Signature(s) Signed: 05/21/2018 7:11:38 AM By: Worthy Keeler  PA-C Entered By: Worthy Keeler on 05/20/2018 09:14:44

## 2018-05-27 ENCOUNTER — Encounter: Payer: Managed Care, Other (non HMO) | Admitting: Physician Assistant

## 2018-05-27 DIAGNOSIS — L97822 Non-pressure chronic ulcer of other part of left lower leg with fat layer exposed: Secondary | ICD-10-CM | POA: Diagnosis not present

## 2018-05-27 NOTE — Progress Notes (Addendum)
SARAPHINA, LAUDERBAUGH ([id number]) Visit Report for 05/27/2018 Chief Complaint Document Details Patient Name: [name], [name] Date of Service: 05/27/2018 8:30 AM Medical Record Number: [id number] Patient Account Number: [id number] Date of Birth/Sex: [birth year] (58 y.o. F) Treating RN: Harold Barban Primary Care Provider: Clayborn Bigness Other Clinician: Referring Provider: Clayborn Bigness Treating Provider/Extender: Melburn Hake, Jw Covin Weeks in Treatment: 3 Information Obtained from: Patient Chief Complaint Left lower leg surgical ulcer Electronic Signature(s) Signed: 05/27/2018 3:59:30 PM By: Worthy Keeler PA-C Entered By: Worthy Keeler on 05/27/2018 08:57:24 Nestler, [name] ([id number]) -------------------------------------------------------------------------------- Debridement Details Patient Name: [name] Date of Service: 05/27/2018 8:30 AM Medical Record Number: [id number] Patient Account Number: [id number] Date of Birth/Sex: 03/12/1961 (57 y.o. F) Treating RN: Harold Barban Primary Care Provider: Clayborn Bigness Other Clinician: Referring Provider: Clayborn Bigness Treating Provider/Extender: Melburn Hake, Shiann Kam Weeks in Treatment: 3 Debridement Performed for Wound #1 Left,Proximal,Medial Lower Leg Assessment: Performed By: Physician STONE III, Susannah Carbin E., PA-C Debridement Type: Debridement Level of Consciousness (Pre- Awake and Alert procedure): Pre-procedure Verification/Time Yes - 09:06 Out Taken: Start Time: 09:06 Pain Control: Lidocaine Total Area Debrided (L x W): 0.6 (cm) x 1.4 (cm) = 0.84 (cm) Tissue and other material Viable, Slough, Subcutaneous, Slough debrided: Level: Skin/Subcutaneous Tissue Debridement Description: Excisional Instrument: Curette Bleeding: Minimum Hemostasis Achieved: Pressure End Time: 09:09 Procedural Pain: 0 Post Procedural Pain: 0 Response to Treatment: Procedure was tolerated well Level of Consciousness Awake and  Alert (Post-procedure): Post Debridement Measurements of Total Wound Length: (cm) 0.6 Width: (cm) 1.4 Depth: (cm) 0.4 Volume: (cm) 0.264 Character of Wound/Ulcer Post Debridement: Improved Post Procedure Diagnosis Same as Pre-procedure Electronic Signature(s) Signed: 05/27/2018 3:50:02 PM By: Harold Barban Signed: 05/27/2018 3:59:30 PM By: Worthy Keeler PA-C Entered By: Harold Barban on 05/27/2018 09:08:53 [place], [name] ([id number]) -------------------------------------------------------------------------------- HPI Details Patient Name: [name] Date of Service: 05/27/2018 8:30 AM Medical Record Number: [id number] Patient Account Number: [id number] Date of Birth/Sex: Dec 25, 1960 (58 y.o. F) Treating RN: Harold Barban Primary Care Provider: Clayborn Bigness Other Clinician: Referring Provider: Clayborn Bigness Treating Provider/Extender: Melburn Hake, Xzavion Doswell Weeks in Treatment: 3 History of Present Illness HPI Description: 05/06/18 on evaluation today patient presents for initial inspection in our clinic concerning an issue that she has been having on her lower extremity as result of a initially biopsy and then subsequently excision of a precancerous lesion which was undertaken by dermatology on 04/02/18. I do not have the pathology report available today for my valuation unfortunately. Nonetheless we are gonna work on getting this from Dr. Elveria Rising office. The patient states that since this was removed and was quite deep sutures were attempted internally to help pull things together and then the area was pulled together utilizing Steri-Strips. Unfortunately it appears that the suture closure but Steri-Strips did not taken the patient is currently having issues with a dehisced wound where there is significant slough buildup internally. Nonetheless I do believe that this is something that may benefit from sharp debridement and good wound care at this time. Fortunately there is no  sign of infection currently. No fevers, chills, nausea, or vomiting noted at this time. The patient is here is a referral from her primary care provider. 05/20/18 on evaluation today patient actually appears to be doing rather well in regard to her lower extremity ulcer. The area of skin that was connecting the small opening to the larger main opening is actually for me now as I expected it likely would. This likely means that we're  gonna need to go ahead and see about trimming this area off which I think is the most perfect and help this area to heal appropriately. The patient is in agreement with this plan today. Other than that there's no sign of infection she seems to be doing very well. 05/27/18 on evaluation today patient actually appears to be doing very well in regard to her leg ulcer. The good news is we did get approval for the Snap Vac. With that being said she seems to be doing so well I'm not even certain that this is going to become necessary. She's not quite sure she would want to proceed with this anyway just due to the fact that it would be somewhat more cumbersome than just utilizing the dressing itself. Nonetheless that is definitely an option we discussed that further during the visit. She seems to be healing quite nicely. Electronic Signature(s) Signed: 06/02/2018 1:37:33 AM By: Worthy Keeler PA-C Previous Signature: 05/27/2018 3:59:30 PM Version By: Worthy Keeler PA-C Entered By: Worthy Keeler on 05/31/2018 08:59:56 [place], [name] ([id number]) -------------------------------------------------------------------------------- Physical Exam Details Patient Name: [name] Date of Service: 05/27/2018 8:30 AM Medical Record Number: [id number] Patient Account Number: [id number] Date of Birth/Sex: [birth year] (57 y.o. F) Treating RN: Harold Barban Primary Care Provider: Clayborn Bigness Other Clinician: Referring Provider: Clayborn Bigness Treating Provider/Extender: STONE  III, Tayten Heber Weeks in Treatment: 3 Constitutional Well-nourished and well-hydrated in no acute distress. Respiratory normal breathing without difficulty. Psychiatric this patient is able to make decisions and demonstrates good insight into disease process. Alert and Oriented x 3. pleasant and cooperative. Notes Upon evaluation today patient's wound bed did require some sharp debridement to remove the slough from the surface of the wound. She actually tolerated this well today without complication. Post debridement the wound bed appears to be doing definitely better which is good news. Overall I'm very pleased with the progress she seems to be making. Electronic Signature(s) Signed: 05/27/2018 3:59:30 PM By: Worthy Keeler PA-C Entered By: Worthy Keeler on 05/27/2018 14:58:29 [place], [name] ([id number]) -------------------------------------------------------------------------------- Physician Orders Details Patient Name: [name] Date of Service: 05/27/2018 8:30 AM Medical Record Number: [id number] Patient Account Number: [id number] Date of Birth/Sex: 09-Jun-1960 (58 y.o. F) Treating RN: Harold Barban Primary Care Provider: Clayborn Bigness Other Clinician: Referring Provider: Clayborn Bigness Treating Provider/Extender: Melburn Hake, Kieley Akter Weeks in Treatment: 3 Verbal / Phone Orders: No Diagnosis Coding ICD-10 Coding Code Description T81.31XA Disruption of external operation (surgical) wound, not elsewhere classified, initial encounter L97.822 Non-pressure chronic ulcer of other part of left lower leg with fat layer exposed Wound Cleansing o May Shower, gently pat wound dry prior to applying new dressing. - Clean wound with Dial antibacterial soap Primary Wound Dressing Wound #1 Left,Proximal,Medial Lower Leg o Alginate o Silver Collagen - Place collagen directly to wound bed, place alginate on top Secondary Dressing Wound #1 Left,Proximal,Medial Lower Leg o Boardered  Foam Dressing Dressing Change Frequency Wound #1 Left,Proximal,Medial Lower Leg o Change dressing every other day. Follow-up Appointments Wound #1 Left,Proximal,Medial Lower Leg o Return Appointment in 1 week. Electronic Signature(s) Signed: 05/27/2018 3:50:02 PM By: Harold Barban Signed: 05/27/2018 3:59:30 PM By: Worthy Keeler PA-C Entered By: Harold Barban on 05/27/2018 09:14:12 [place], [name] ([id number]) -------------------------------------------------------------------------------- Problem List Details Patient Name: [name] Date of Service: 05/27/2018 8:30 AM Medical Record Number: [id number] Patient Account Number: [id number] Date of Birth/Sex: Oct 08, 1960 (58 y.o. F) Treating RN: Harold Barban Primary Care Provider: Humphrey Rolls,  Clifton.Rei Other Clinician: Referring Provider: Clayborn Bigness Treating Provider/Extender: Melburn Hake, Okema Rollinson Weeks in Treatment: 3 Active Problems ICD-10 Evaluated Encounter Code Description Active Date Today Diagnosis T81.31XA Disruption of external operation (surgical) wound, not 05/06/2018 No Yes elsewhere classified, initial encounter L97.822 Non-pressure chronic ulcer of other part of left lower leg with 05/06/2018 No Yes fat layer exposed Inactive Problems Resolved Problems Electronic Signature(s) Signed: 05/27/2018 3:59:30 PM By: Worthy Keeler PA-C Entered By: Worthy Keeler on 05/27/2018 08:57:18 [place], [name] ([id number]) -------------------------------------------------------------------------------- Progress Note Details Patient Name: [name] Date of Service: 05/27/2018 8:30 AM Medical Record Number: [id number] Patient Account Number: [id number] Date of Birth/Sex: 08/18/1960 (57 y.o. F) Treating RN: Harold Barban Primary Care Provider: Clayborn Bigness Other Clinician: Referring Provider: Clayborn Bigness Treating Provider/Extender: Melburn Hake, Marvetta Vohs Weeks in Treatment: 3 Subjective Chief Complaint Information  obtained from Patient Left lower leg surgical ulcer History of Present Illness (HPI) 05/06/18 on evaluation today patient presents for initial inspection in our clinic concerning an issue that she has been having on her lower extremity as result of a initially biopsy and then subsequently excision of a precancerous lesion which was undertaken by dermatology on 04/02/18. I do not have the pathology report available today for my valuation unfortunately. Nonetheless we are gonna work on getting this from Dr. Elveria Rising office. The patient states that since this was removed and was quite deep sutures were attempted internally to help pull things together and then the area was pulled together utilizing Steri-Strips. Unfortunately it appears that the suture closure but Steri-Strips did not taken the patient is currently having issues with a dehisced wound where there is significant slough buildup internally. Nonetheless I do believe that this is something that may benefit from sharp debridement and good wound care at this time. Fortunately there is no sign of infection currently. No fevers, chills, nausea, or vomiting noted at this time. The patient is here is a referral from her primary care provider. 05/20/18 on evaluation today patient actually appears to be doing rather well in regard to her lower extremity ulcer. The area of skin that was connecting the small opening to the larger main opening is actually for me now as I expected it likely would. This likely means that we're gonna need to go ahead and see about trimming this area off which I think is the most perfect and help this area to heal appropriately. The patient is in agreement with this plan today. Other than that there's no sign of infection she seems to be doing very well. 05/27/18 on evaluation today patient actually appears to be doing very well in regard to her leg ulcer. The good news is we did get approval for the Snap Vac. With that  being said she seems to be doing so well I'm not even certain that this is going to become necessary. She's not quite sure she would want to proceed with this anyway just due to the fact that it would be somewhat more cumbersome than just utilizing the dressing itself. Nonetheless that is definitely an option we discussed that further during the visit. She seems to be healing quite nicely. Patient History Information obtained from Patient. Family History Heart Disease - Father,Paternal Grandparents, Hypertension - Father,Paternal Grandparents, No family history of Cancer, Diabetes, Hereditary Spherocytosis, Kidney Disease, Lung Disease, Seizures, Stroke, Thyroid Problems, Tuberculosis. Social History Never smoker, Marital Status - Married, Alcohol Use - Rarely, Drug Use - No History, Caffeine Use - Daily. Medical And Surgical  History Notes Cardiovascular laser vein treatments and scleratherapy 2019 Musculoskeletal psoriasis Review of Systems (ROS) Sanluis, Bostic ([id number]) Constitutional Symptoms (General Health) Denies complaints or symptoms of Fever, Chills. Respiratory The patient has no complaints or symptoms. Cardiovascular The patient has no complaints or symptoms. Gastrointestinal The patient has no complaints or symptoms. Psychiatric The patient has no complaints or symptoms. Objective Constitutional Well-nourished and well-hydrated in no acute distress. Vitals Time Taken: 8:29 AM, Height: 64 in, Weight: 178 lbs, BMI: 30.6, Temperature: 98.0 F, Pulse: 68 bpm, Respiratory Rate: 16 breaths/min, Blood Pressure: 135/76 mmHg. Respiratory normal breathing without difficulty. Psychiatric this patient is able to make decisions and demonstrates good insight into disease process. Alert and Oriented x 3. pleasant and cooperative. General Notes: Upon evaluation today patient's wound bed did require some sharp debridement to remove the slough from the surface of the wound.  She actually tolerated this well today without complication. Post debridement the wound bed appears to be doing definitely better which is good news. Overall I'm very pleased with the progress she seems to be making. Integumentary (Hair, Skin) Wound #1 status is Open. Original cause of wound was Surgical Injury. The wound is located on the Left,Proximal,Medial Lower Leg. The wound measures 0.6cm length x 1.4cm width x 0.4cm depth; 0.66cm^2 area and 0.264cm^3 volume. There is Fat Layer (Subcutaneous Tissue) Exposed exposed. There is no tunneling or undermining noted. There is a medium amount of serous drainage noted. The wound margin is flat and intact. There is medium (34-66%) red granulation within the wound bed. There is a medium (34-66%) amount of necrotic tissue within the wound bed including Adherent Slough. The periwound skin appearance exhibited: Erythema. The periwound skin appearance did not exhibit: Callus, Crepitus, Excoriation, Induration, Rash, Scarring, Dry/Scaly, Maceration, Atrophie Blanche, Cyanosis, Ecchymosis, Hemosiderin Staining, Mottled, Pallor, Rubor. The surrounding wound skin color is noted with erythema which is circumferential. Periwound temperature was noted as No Abnormality. The periwound has tenderness on palpation. Assessment Active Problems Burzynski, Nefertari L. ([id number]) ICD-10 Disruption of external operation (surgical) wound, not elsewhere classified, initial encounter Non-pressure chronic ulcer of other part of left lower leg with fat layer exposed Procedures Wound #1 Pre-procedure diagnosis of Wound #1 is an Open Surgical Wound located on the Left,Proximal,Medial Lower Leg . There was a Excisional Skin/Subcutaneous Tissue Debridement with a total area of 0.84 sq cm performed by STONE III, Kamiya Acord E., PA-C. With the following instrument(s): Curette to remove Viable tissue/material. Material removed includes Subcutaneous Tissue and Slough and after achieving  pain control using Lidocaine. No specimens were taken. A time out was conducted at 09:06, prior to the start of the procedure. A Minimum amount of bleeding was controlled with Pressure. The procedure was tolerated well with a pain level of 0 throughout and a pain level of 0 following the procedure. Post Debridement Measurements: 0.6cm length x 1.4cm width x 0.4cm depth; 0.264cm^3 volume. Character of Wound/Ulcer Post Debridement is improved. Post procedure Diagnosis Wound #1: Same as Pre-Procedure Plan Wound Cleansing: May Shower, gently pat wound dry prior to applying new dressing. - Clean wound with Dial antibacterial soap Primary Wound Dressing: Wound #1 Left,Proximal,Medial Lower Leg: Alginate Silver Collagen - Place collagen directly to wound bed, place alginate on top Secondary Dressing: Wound #1 Left,Proximal,Medial Lower Leg: Boardered Foam Dressing Dressing Change Frequency: Wound #1 Left,Proximal,Medial Lower Leg: Change dressing every other day. Follow-up Appointments: Wound #1 Left,Proximal,Medial Lower Leg: Return Appointment in 1 week. My suggestion at this time is gonna be that we  go ahead and continue with the above wound care measures and she seems to be doing so well she's in agreement that plan. If anything changes or doesn't continue to improve then we may consider going forward with the snap vac. She's in agreement the plan. Please see above for specific wound care orders. We will see patient for re-evaluation in 1 week(s) here in the clinic. If anything worsens or changes patient will contact our office for additional recommendations. Electronic Signature(s) ALYNN, ELLITHORPE ([id number]) Signed: 06/02/2018 1:37:33 AM By: Worthy Keeler PA-C Previous Signature: 05/27/2018 3:59:30 PM Version By: Worthy Keeler PA-C Entered By: Worthy Keeler on 05/31/2018 09:00:06 [place], [name]  ([id number]) -------------------------------------------------------------------------------- ROS/PFSH Details Patient Name: [name] Date of Service: 05/27/2018 8:30 AM Medical Record Number: [id number] Patient Account Number: [id number] Date of Birth/Sex: [birth year] (58 y.o. F) Treating RN: Harold Barban Primary Care Provider: Clayborn Bigness Other Clinician: Referring Provider: Clayborn Bigness Treating Provider/Extender: Melburn Hake, Octavis Sheeler Weeks in Treatment: 3 Information Obtained From Patient Wound History Do you currently have one or more open woundso Yes How many open wounds do you currently haveo 1 Approximately how long have you had your woundso 5 weeks How have you been treating your wound(s) until nowo open to air Has your wound(s) ever healed and then re-openedo No Have you had any lab work done in the past montho No Have you tested positive for an antibiotic resistant organism (MRSA, VRE)o No Have you tested positive for osteomyelitis (bone infection)o No Have you had any tests for circulation on your legso No Constitutional Symptoms (General Health) Complaints and Symptoms: Negative for: Fever; Chills Eyes Medical History: Negative for: Cataracts; Glaucoma; Optic Neuritis Ear/Nose/Mouth/Throat Medical History: Negative for: Chronic sinus problems/congestion; Middle ear problems Hematologic/Lymphatic Medical History: Negative for: Anemia; Hemophilia; Human Immunodeficiency Virus; Lymphedema; Sickle Cell Disease Respiratory Complaints and Symptoms: No Complaints or Symptoms Medical History: Negative for: Aspiration; Asthma; Chronic Obstructive Pulmonary Disease (COPD); Pneumothorax; Sleep Apnea; Tuberculosis Cardiovascular Complaints and Symptoms: No Complaints or Symptoms Medical History: Negative for: Angina; Arrhythmia; Congestive Heart Failure; Coronary Artery Disease; Deep Vein Thrombosis; Fillinger, Annarose L. ([id number]) Hypertension; Hypotension;  Myocardial Infarction; Peripheral Arterial Disease; Peripheral Venous Disease; Phlebitis; Vasculitis Past Medical History Notes: laser vein treatments and scleratherapy 2019 Gastrointestinal Complaints and Symptoms: No Complaints or Symptoms Medical History: Negative for: Cirrhosis ; Colitis; Crohnos; Hepatitis A; Hepatitis B; Hepatitis C Endocrine Medical History: Negative for: Type I Diabetes; Type II Diabetes Genitourinary Medical History: Negative for: End Stage Renal Disease Immunological Medical History: Negative for: Lupus Erythematosus; Raynaudos; Scleroderma Integumentary (Skin) Medical History: Negative for: History of Burn; History of pressure wounds Musculoskeletal Medical History: Negative for: Gout; Rheumatoid Arthritis; Osteoarthritis; Osteomyelitis Past Medical History Notes: psoriasis Neurologic Medical History: Negative for: Dementia; Neuropathy; Quadriplegia; Paraplegia; Seizure Disorder Oncologic Medical History: Negative for: Received Chemotherapy; Received Radiation Psychiatric Complaints and Symptoms: No Complaints or Symptoms Medical History: Negative for: Anorexia/bulimia; Confinement Anxiety Immunizations Jocelyn, Jalise L. ([id number]) Pneumococcal Vaccine: Received Pneumococcal Vaccination: No Immunization Notes: up to date Implantable Devices Family and Social History Cancer: No; Diabetes: No; Heart Disease: Yes - Father,Paternal Grandparents; Hereditary Spherocytosis: No; Hypertension: Yes - Father,Paternal Grandparents; Kidney Disease: No; Lung Disease: No; Seizures: No; Stroke: No; Thyroid Problems: No; Tuberculosis: No; Never smoker; Marital Status - Married; Alcohol Use: Rarely; Drug Use: No History; Caffeine Use: Daily; Financial Concerns: No; Food, Clothing or Shelter Needs: No; Support System Lacking: No; Transportation Concerns: No; Advanced Directives: No; Patient does not want information on  Advanced Directives Physician  Affirmation I have reviewed and agree with the above information. Electronic Signature(s) Signed: 05/27/2018 3:50:02 PM By: Harold Barban Signed: 05/27/2018 3:59:30 PM By: Worthy Keeler PA-C Entered By: Worthy Keeler on 05/27/2018 14:58:17 [place], [name] ([id number]) -------------------------------------------------------------------------------- SuperBill Details Patient Name: [name] Date of Service: 05/27/2018 Medical Record Number: [id number] Patient Account Number: [id number] Date of Birth/Sex: 1960/05/18 (58 y.o. F) Treating RN: Harold Barban Primary Care Provider: Clayborn Bigness Other Clinician: Referring Provider: Clayborn Bigness Treating Provider/Extender: Melburn Hake, Geramy Lamorte Weeks in Treatment: 3 Diagnosis Coding ICD-10 Codes Code Description T81.31XA Disruption of external operation (surgical) wound, not elsewhere classified, initial encounter L97.822 Non-pressure chronic ulcer of other part of left lower leg with fat layer exposed Facility Procedures CPT4 Code Description: [id number] 11042 - DEB SUBQ TISSUE 20 SQ CM/< ICD-10 Diagnosis Description L97.822 Non-pressure chronic ulcer of other part of left lower leg with Modifier: fat layer exposed Quantity: 1 Physician Procedures CPT4 Code Description: [id number] 11042 - WC PHYS SUBQ TISS 20 SQ CM ICD-10 Diagnosis Description L97.822 Non-pressure chronic ulcer of other part of left lower leg with Modifier: fat layer exposed Quantity: 1 Electronic Signature(s) Signed: 05/27/2018 3:59:30 PM By: Worthy Keeler PA-C Entered By: Worthy Keeler on 05/27/2018 15:08:16

## 2018-05-30 NOTE — Progress Notes (Signed)
Stacey, [name] ([id number]) Visit Report for 05/27/2018 Arrival Information Details Patient Name: [name], [name] Date of Service: 05/27/2018 8:30 AM Medical Record Number: [id number] Patient Account Number: [id number] Date of Birth/Sex: 02-19-1961 (58 y.o. F) Treating RN: Harold Barban Primary Care Glynn Freas: Clayborn Bigness Other Clinician: Referring Hymen Arnett: Clayborn Bigness Treating Jacque Garrels/Extender: Melburn Hake, HOYT Weeks in Treatment: 3 Visit Information History Since Last Visit Added or deleted any medications: No Patient Arrived: Ambulatory Any new allergies or adverse reactions: No Arrival Time: 08:29 Had a fall or experienced change in No Accompanied By: self activities of daily living that may affect Transfer Assistance: None risk of falls: Signs or symptoms of abuse/neglect since last visito No Hospitalized since last visit: No Implantable device outside of the clinic excluding No cellular tissue based products placed in the center since last visit: Has Dressing in Place as Prescribed: Yes Pain Present Now: No Electronic Signature(s) Signed: 05/27/2018 11:32:53 AM By: Lorine Bears RCP, RRT, CHT Entered By: Lorine Bears on 05/27/2018 08:29:41 Vought, [name] ([id number]) -------------------------------------------------------------------------------- Lower Extremity Assessment Details Patient Name: [name] Date of Service: 05/27/2018 8:30 AM Medical Record Number: [id number] Patient Account Number: [id number] Date of Birth/Sex: 1960-09-12 (58 y.o. F) Treating RN: Secundino Ginger Primary Care Rae Sutcliffe: Clayborn Bigness Other Clinician: Referring Haytham Maher: Clayborn Bigness Treating Milah Recht/Extender: Melburn Hake, HOYT Weeks in Treatment: 3 Edema Assessment Assessed: [Left: No] [Right: No] Edema: [Left: N] [Right: o] Calf Left: Right: Point of Measurement: 29 cm From Medial Instep 37 cm cm Ankle Left: Right: Point of Measurement: 12 cm  From Medial Instep 21 cm cm Vascular Assessment Claudication: Claudication Assessment [Left:None] Pulses: Dorsalis Pedis Palpable: [Left:Yes] Posterior Tibial Extremity colors, hair growth, and conditions: Extremity Color: [Left:Hyperpigmented] Hair Growth on Extremity: [Left:No] Temperature of Extremity: [Left:Warm] Capillary Refill: [Left:< 3 seconds] Toe Nail Assessment Left: Right: Thick: No Discolored: No Deformed: No Improper Length and Hygiene: No Electronic Signature(s) Signed: 05/29/2018 9:53:12 AM By: Secundino Ginger Entered By: Secundino Ginger on 05/27/2018 08:59:24 Subramaniam, [name] ([id number]) -------------------------------------------------------------------------------- Multi Wound Chart Details Patient Name: [name] Date of Service: 05/27/2018 8:30 AM Medical Record Number: [id number] Patient Account Number: [id number] Date of Birth/Sex: [birth year] (58 y.o. F) Treating RN: Harold Barban Primary Care Nyair Depaulo: Clayborn Bigness Other Clinician: Referring Ryah Cribb: Clayborn Bigness Treating Chade Pitner/Extender: Melburn Hake, HOYT Weeks in Treatment: 3 Vital Signs Height(in): 64 Pulse(bpm): 68 Weight(lbs): 178 Blood Pressure(mmHg): 135/76 Body Mass Index(BMI): 31 Temperature(F): 98.0 Respiratory Rate 16 (breaths/min): Photos: [1:No Photos] [N/A:N/A] Wound Location: [1:Left Lower Leg - Medial, Proximal] [N/A:N/A] Wounding Event: [1:Surgical Injury] [N/A:N/A] Primary Etiology: [1:Open Surgical Wound] [N/A:N/A] Date Acquired: [1:04/02/2018] [N/A:N/A] Weeks of Treatment: [1:3] [N/A:N/A] Wound Status: [1:Open] [N/A:N/A] Measurements L x W x D [1:0.6x1.4x0.4] [N/A:N/A] (cm) Area (cm) : [1:0.66] [N/A:N/A] Volume (cm) : [1:0.264] [N/A:N/A] % Reduction in Area: [1:12.50%] [N/A:N/A] % Reduction in Volume: [1:65.00%] [N/A:N/A] Classification: [1:Full Thickness Without Exposed Support Structures] [N/A:N/A] Exudate Amount: [1:Medium] [N/A:N/A] Exudate Type: [1:Serous]  [N/A:N/A] Exudate Color: [1:amber] [N/A:N/A] Wound Margin: [1:Flat and Intact] [N/A:N/A] Granulation Amount: [1:Medium (34-66%)] [N/A:N/A] Granulation Quality: [1:Red] [N/A:N/A] Necrotic Amount: [1:Medium (34-66%)] [N/A:N/A] Exposed Structures: [1:Fat Layer (Subcutaneous Tissue) Exposed: Yes Fascia: No Tendon: No Muscle: No Joint: No Bone: No] [N/A:N/A] Epithelialization: [1:None] [N/A:N/A] Periwound Skin Texture: [1:Excoriation: No Induration: No Callus: No Crepitus: No Rash: No Scarring: No] [N/A:N/A] Periwound Skin Moisture: Maceration: No N/A N/A Dry/Scaly: No Periwound Skin Color: Erythema: Yes N/A N/A Atrophie Blanche: No Cyanosis: No Ecchymosis: No Hemosiderin Staining: No Mottled: No Pallor: No  Rubor: No Erythema Location: Circumferential N/A N/A Temperature: No Abnormality N/A N/A Tenderness on Palpation: Yes N/A N/A Wound Preparation: Ulcer Cleansing: N/A N/A Rinsed/Irrigated with Saline Topical Anesthetic Applied: Other: lidocaine 4% Treatment Notes Electronic Signature(s) Signed: 05/27/2018 3:50:02 PM By: Harold Barban Entered By: Harold Barban on 05/27/2018 09:06:00 Hott, [name] ([id number]) -------------------------------------------------------------------------------- [place] Details Patient Name: [name] Date of Service: 05/27/2018 8:30 AM Medical Record Number: [id number] Patient Account Number: [id number] Date of Birth/Sex: [birth year] (58 y.o. F) Treating RN: Harold Barban Primary Care Blakleigh Straw: Clayborn Bigness Other Clinician: Referring Manjit Bufano: Clayborn Bigness Treating Aiyanah Kalama/Extender: Melburn Hake, HOYT Weeks in Treatment: 3 Active Inactive Wound/Skin Impairment Nursing Diagnoses: Knowledge deficit related to ulceration/compromised skin integrity Goals: Ulcer/skin breakdown will have a volume reduction of 30% by week 4 Date Initiated: 05/06/2018 Target Resolution Date: 06/06/2018 Goal Status:  Active Interventions: Assess patient/caregiver ability to obtain necessary supplies Assess patient/caregiver ability to perform ulcer/skin care regimen upon admission and as needed Assess ulceration(s) every visit Notes: Electronic Signature(s) Signed: 05/27/2018 3:50:02 PM By: Harold Barban Entered By: Harold Barban on 05/27/2018 09:05:49 Moro, [name] ([id number]) -------------------------------------------------------------------------------- Pain Assessment Details Patient Name: [name] Date of Service: 05/27/2018 8:30 AM Medical Record Number: [id number] Patient Account Number: [id number] Date of Birth/Sex: 04-10-1961 (58 y.o. F) Treating RN: Harold Barban Primary Care Porsche Noguchi: Clayborn Bigness Other Clinician: Referring Danasha Melman: Clayborn Bigness Treating Torie Towle/Extender: Melburn Hake, HOYT Weeks in Treatment: 3 Active Problems Location of Pain Severity and Description of Pain Patient Has Paino No Site Locations Pain Management and Medication Current Pain Management: Electronic Signature(s) Signed: 05/27/2018 11:32:53 AM By: Lorine Bears RCP, RRT, CHT Signed: 05/27/2018 3:50:02 PM By: Harold Barban Entered By: Lorine Bears on 05/27/2018 08:29:49 [place], [name] ([id number]) -------------------------------------------------------------------------------- Patient/Caregiver Education Details Patient Name: [name] Date of Service: 05/27/2018 8:30 AM Medical Record Number: [id number] Patient Account Number: [id number] Date of Birth/Gender: 06/01/1960 (58 y.o. F) Treating RN: Harold Barban Primary Care Physician: Clayborn Bigness Other Clinician: Referring Physician: Clayborn Bigness Treating Physician/Extender: Sharalyn Ink in Treatment: 3 Education Assessment Education Provided To: Patient Education Topics Provided Wound/Skin Impairment: Handouts: Caring for Your Ulcer Methods: Demonstration,  Explain/Verbal Responses: State content correctly Electronic Signature(s) Signed: 05/27/2018 3:50:02 PM By: Harold Barban Entered By: Harold Barban on 05/27/2018 09:07:18 Bicknell, [name] ([id number]) -------------------------------------------------------------------------------- Wound Assessment Details Patient Name: [name] Date of Service: 05/27/2018 8:30 AM Medical Record Number: [id number] Patient Account Number: [id number] Date of Birth/Sex: 1961-03-30 (58 y.o. F) Treating RN: Secundino Ginger Primary Care Wray Goehring: Clayborn Bigness Other Clinician: Referring Darnel Mchan: Clayborn Bigness Treating Kenyatta Gloeckner/Extender: Melburn Hake, HOYT Weeks in Treatment: 3 Wound Status Wound Number: 1 Primary Etiology: Open Surgical Wound Wound Location: Left Lower Leg - Medial, Proximal Wound Status: Open Wounding Event: Surgical Injury Date Acquired: 04/02/2018 Weeks Of Treatment: 3 Clustered Wound: No Photos Photo Uploaded By: Secundino Ginger on 05/27/2018 09:08:07 Wound Measurements Length: (cm) 0.6 Width: (cm) 1.4 Depth: (cm) 0.4 Area: (cm) 0.66 Volume: (cm) 0.264 % Reduction in Area: 12.5% % Reduction in Volume: 65% Epithelialization: None Tunneling: No Undermining: No Wound Description Full Thickness Without Exposed Support Foul Odo Classification: Structures Slough/F Wound Margin: Flat and Intact Exudate Medium Amount: Exudate Type: Serous Exudate Color: amber r After Cleansing: No ibrino Yes Wound Bed Granulation Amount: Medium (34-66%) Exposed Structure Granulation Quality: Red Fascia Exposed: No Necrotic Amount: Medium (34-66%) Fat Layer (Subcutaneous Tissue) Exposed: Yes Necrotic Quality: Adherent Slough Tendon Exposed: No Muscle Exposed: No Joint Exposed: No  Bone Exposed: No Siefken, Serria L. ([id number]) Periwound Skin Texture Texture Color No Abnormalities Noted: No No Abnormalities Noted: No Callus: No Atrophie Blanche: No Crepitus: No Cyanosis:  No Excoriation: No Ecchymosis: No Induration: No Erythema: Yes Rash: No Erythema Location: Circumferential Scarring: No Hemosiderin Staining: No Mottled: No Moisture Pallor: No No Abnormalities Noted: No Rubor: No Dry / Scaly: No Maceration: No Temperature / Pain Temperature: No Abnormality Tenderness on Palpation: Yes Wound Preparation Ulcer Cleansing: Rinsed/Irrigated with Saline Topical Anesthetic Applied: Other: lidocaine 4%, Electronic Signature(s) Signed: 05/29/2018 9:53:12 AM By: Secundino Ginger Entered By: Secundino Ginger on 05/27/2018 08:57:51 [place], [name] ([id number]) -------------------------------------------------------------------------------- Vitals Details Patient Name: [name] Date of Service: 05/27/2018 8:30 AM Medical Record Number: [id number] Patient Account Number: [id number] Date of Birth/Sex: Oct 11, 1960 (58 y.o. F) Treating RN: Harold Barban Primary Care Caprina Wussow: Clayborn Bigness Other Clinician: Referring Krystyn Picking: Clayborn Bigness Treating Ethlyn Alto/Extender: Melburn Hake, HOYT Weeks in Treatment: 3 Vital Signs Time Taken: 08:29 Temperature (F): 98.0 Height (in): 64 Pulse (bpm): 68 Weight (lbs): 178 Respiratory Rate (breaths/min): 16 Body Mass Index (BMI): 30.6 Blood Pressure (mmHg): 135/76 Reference Range: 80 - 120 mg / dl Electronic Signature(s) Signed: 05/27/2018 11:32:53 AM By: Lorine Bears RCP, RRT, CHT Entered By: Becky Sax, Amado Nash on 05/27/2018 08:32:18

## 2018-06-03 ENCOUNTER — Encounter: Payer: Managed Care, Other (non HMO) | Admitting: Physician Assistant

## 2018-06-03 DIAGNOSIS — L97822 Non-pressure chronic ulcer of other part of left lower leg with fat layer exposed: Secondary | ICD-10-CM | POA: Diagnosis not present

## 2018-06-04 NOTE — Progress Notes (Signed)
HALAH, WHITESIDE ([id number]) Visit Report for 06/03/2018 Chief Complaint Document Details Patient Name: [name], [name] Date of Service: 06/03/2018 8:15 AM Medical Record Number: [id number] Patient Account Number: [id number] Date of Birth/Sex: November 19, 1960 (58 y.o. F) Treating RN: Harold Barban Primary Care Provider: Clayborn Bigness Other Clinician: Referring Provider: Clayborn Bigness Treating Provider/Extender: Melburn Hake, Oliver Heitzenrater Weeks in Treatment: 4 Information Obtained from: Patient Chief Complaint Left lower leg surgical ulcer Electronic Signature(s) Signed: 06/03/2018 9:58:15 PM By: Worthy Keeler PA-C Entered By: Worthy Keeler on 06/03/2018 08:40:03 [place], [name] ([id number]) -------------------------------------------------------------------------------- Debridement Details Patient Name: [name] Date of Service: 06/03/2018 8:15 AM Medical Record Number: [id number] Patient Account Number: [id number] Date of Birth/Sex: 07-01-1960 (57 y.o. F) Treating RN: Harold Barban Primary Care Provider: Clayborn Bigness Other Clinician: Referring Provider: Clayborn Bigness Treating Provider/Extender: Melburn Hake, Tasmin Exantus Weeks in Treatment: 4 Debridement Performed for Wound #1 Left,Proximal,Medial Lower Leg Assessment: Performed By: Physician STONE III, Ruweyda Macknight E., PA-C Debridement Type: Debridement Level of Consciousness (Pre- Awake and Alert procedure): Pre-procedure Verification/Time Yes - 08:45 Out Taken: Start Time: 08:45 Pain Control: Lidocaine Total Area Debrided (L x W): 0.87 (cm) x 1 (cm) = 0.87 (cm) Tissue and other material Non-Viable, Slough, Subcutaneous, Slough debrided: Level: Skin/Subcutaneous Tissue Debridement Description: Excisional Instrument: Curette Bleeding: Minimum Hemostasis Achieved: Pressure End Time: 08:48 Procedural Pain: 0 Post Procedural Pain: 0 Response to Treatment: Procedure was tolerated well Level of Consciousness Awake and  Alert (Post-procedure): Post Debridement Measurements of Total Wound Length: (cm) 0.7 Width: (cm) 1 Depth: (cm) 0.3 Volume: (cm) 0.165 Character of Wound/Ulcer Post Debridement: Improved Post Procedure Diagnosis Same as Pre-procedure Electronic Signature(s) Signed: 06/03/2018 5:13:49 PM By: Harold Barban Signed: 06/03/2018 9:58:15 PM By: Worthy Keeler PA-C Entered By: Worthy Keeler on 06/03/2018 11:31:01 [place], [name] ([id number]) -------------------------------------------------------------------------------- HPI Details Patient Name: [name] Date of Service: 06/03/2018 8:15 AM Medical Record Number: [id number] Patient Account Number: [id number] Date of Birth/Sex: 09-Jun-1960 (58 y.o. F) Treating RN: Harold Barban Primary Care Provider: Clayborn Bigness Other Clinician: Referring Provider: Clayborn Bigness Treating Provider/Extender: Melburn Hake, Tiandra Swoveland Weeks in Treatment: 4 History of Present Illness HPI Description: 05/06/18 on evaluation today patient presents for initial inspection in our clinic concerning an issue that she has been having on her lower extremity as result of a initially biopsy and then subsequently excision of a precancerous lesion which was undertaken by dermatology on 04/02/18. I do not have the pathology report available today for my valuation unfortunately. Nonetheless we are gonna work on getting this from Dr. Elveria Rising office. The patient states that since this was removed and was quite deep sutures were attempted internally to help pull things together and then the area was pulled together utilizing Steri-Strips. Unfortunately it appears that the suture closure but Steri-Strips did not taken the patient is currently having issues with a dehisced wound where there is significant slough buildup internally. Nonetheless I do believe that this is something that may benefit from sharp debridement and good wound care at this time. Fortunately there is no  sign of infection currently. No fevers, chills, nausea, or vomiting noted at this time. The patient is here is a referral from her primary care provider. 05/20/18 on evaluation today patient actually appears to be doing rather well in regard to her lower extremity ulcer. The area of skin that was connecting the small opening to the larger main opening is actually for me now as I expected it likely would. This likely means that  we're gonna need to go ahead and see about trimming this area off which I think is the most perfect and help this area to heal appropriately. The patient is in agreement with this plan today. Other than that there's no sign of infection she seems to be doing very well. 05/27/18 on evaluation today patient actually appears to be doing very well in regard to her leg ulcer. The good news is we did get approval for the Snap Vac. With that being said she seems to be doing so well I'm not even certain that this is going to become necessary. She's not quite sure she would want to proceed with this anyway just due to the fact that it would be somewhat more cumbersome than just utilizing the dressing itself. Nonetheless that is definitely an option we discussed that further during the visit. She seems to be healing quite nicely. 06/03/18 on evaluation today patient appears to be doing well in regard to her lower extremity ulcer. She has been tolerating the dressing changes without complication. Fortunately there is no sign of infection she did have some Slough noted on the surface of the wound today. Electronic Signature(s) Signed: 06/03/2018 9:58:15 PM By: Worthy Keeler PA-C Entered By: Worthy Keeler on 06/03/2018 11:29:12 [place], [name] ([id number]) -------------------------------------------------------------------------------- Physical Exam Details Patient Name: [name] Date of Service: 06/03/2018 8:15 AM Medical Record Number: [id number] Patient Account Number:  [id number] Date of Birth/Sex: [birth year] (58 y.o. F) Treating RN: Harold Barban Primary Care Provider: Clayborn Bigness Other Clinician: Referring Provider: Clayborn Bigness Treating Provider/Extender: STONE III, Nicholad Kautzman Weeks in Treatment: 4 Constitutional Well-nourished and well-hydrated in no acute distress. Respiratory normal breathing without difficulty. Psychiatric this patient is able to make decisions and demonstrates good insight into disease process. Alert and Oriented x 3. pleasant and cooperative. Notes Patient's wound bed currently shows evidence of fairly good granulation at this time she did have some Slough noted on the surface of the wound which did require sharp debridement at this point. With that being said post debridement the wound bed appears to be doing well and I do believe that she is making good progress. Electronic Signature(s) Signed: 06/03/2018 9:58:15 PM By: Worthy Keeler PA-C Entered By: Worthy Keeler on 06/03/2018 11:30:16 [place], [name] ([id number]) -------------------------------------------------------------------------------- Physician Orders Details Patient Name: [name] Date of Service: 06/03/2018 8:15 AM Medical Record Number: [id number] Patient Account Number: [id number] Date of Birth/Sex: Jan 22, 1961 (58 y.o. F) Treating RN: Harold Barban Primary Care Provider: Clayborn Bigness Other Clinician: Referring Provider: Clayborn Bigness Treating Provider/Extender: Melburn Hake, Aevah Stansbery Weeks in Treatment: 4 Verbal / Phone Orders: No Diagnosis Coding ICD-10 Coding Code Description T81.31XA Disruption of external operation (surgical) wound, not elsewhere classified, initial encounter [phone] Non-pressure chronic ulcer of other part of left lower leg with fat layer exposed Wound Cleansing o May Shower, gently pat wound dry prior to applying new dressing. - Clean wound with Dial antibacterial soap Primary Wound Dressing Wound #1 Left,Proximal,Medial Lower  Leg o Silver Collagen - Place collagen directly to wound bed, place alginate on top o Drawtex - cut a piece just big enough to cover wound Secondary Dressing Wound #1 Left,Proximal,Medial Lower Leg o Boardered Foam Dressing Dressing Change Frequency Wound #1 Left,Proximal,Medial Lower Leg o Change dressing every other day. Follow-up Appointments Wound #1 Left,Proximal,Medial Lower Leg o Return Appointment in 1 week. Electronic Signature(s) Signed: 06/03/2018 5:13:49 PM By: Harold Barban Signed: 06/03/2018 9:58:15 PM By: Worthy Keeler PA-C Entered By:  Harold Barban on 06/03/2018 08:50:50 Brazell, KAILENE STEINHART ([id number]) -------------------------------------------------------------------------------- Problem List Details Patient Name: ENYAH, MOMAN Date of Service: 06/03/2018 8:15 AM Medical Record Number: [id number] Patient Account Number: [id number] Date of Birth/Sex: 1960/05/27 (58 y.o. F) Treating RN: Harold Barban Primary Care Provider: Clayborn Bigness Other Clinician: Referring Provider: Clayborn Bigness Treating Provider/Extender: Melburn Hake, Johnmark Geiger Weeks in Treatment: 4 Active Problems ICD-10 Evaluated Encounter Code Description Active Date Today Diagnosis T81.31XA Disruption of external operation (surgical) wound, not 05/06/2018 No Yes elsewhere classified, initial encounter L97.822 Non-pressure chronic ulcer of other part of left lower leg with 05/06/2018 No Yes fat layer exposed Inactive Problems Resolved Problems Electronic Signature(s) Signed: 06/03/2018 9:58:15 PM By: Worthy Keeler PA-C Entered By: Worthy Keeler on 06/03/2018 08:39:58 Mcenroe, [name] ([id number]) -------------------------------------------------------------------------------- Progress Note Details Patient Name: [name] Date of Service: 06/03/2018 8:15 AM Medical Record Number: [id number] Patient Account Number: [id number] Date of Birth/Sex: November 27, 1960 (57 y.o. F) Treating  RN: Harold Barban Primary Care Provider: Clayborn Bigness Other Clinician: Referring Provider: Clayborn Bigness Treating Provider/Extender: Melburn Hake, Anandi Abramo Weeks in Treatment: 4 Subjective Chief Complaint Information obtained from Patient Left lower leg surgical ulcer History of Present Illness (HPI) 05/06/18 on evaluation today patient presents for initial inspection in our clinic concerning an issue that she has been having on her lower extremity as result of a initially biopsy and then subsequently excision of a precancerous lesion which was undertaken by dermatology on 04/02/18. I do not have the pathology report available today for my valuation unfortunately. Nonetheless we are gonna work on getting this from Dr. Elveria Rising office. The patient states that since this was removed and was quite deep sutures were attempted internally to help pull things together and then the area was pulled together utilizing Steri-Strips. Unfortunately it appears that the suture closure but Steri-Strips did not taken the patient is currently having issues with a dehisced wound where there is significant slough buildup internally. Nonetheless I do believe that this is something that may benefit from sharp debridement and good wound care at this time. Fortunately there is no sign of infection currently. No fevers, chills, nausea, or vomiting noted at this time. The patient is here is a referral from her primary care provider. 05/20/18 on evaluation today patient actually appears to be doing rather well in regard to her lower extremity ulcer. The area of skin that was connecting the small opening to the larger main opening is actually for me now as I expected it likely would. This likely means that we're gonna need to go ahead and see about trimming this area off which I think is the most perfect and help this area to heal appropriately. The patient is in agreement with this plan today. Other than that there's no sign  of infection she seems to be doing very well. 05/27/18 on evaluation today patient actually appears to be doing very well in regard to her leg ulcer. The good news is we did get approval for the Snap Vac. With that being said she seems to be doing so well I'm not even certain that this is going to become necessary. She's not quite sure she would want to proceed with this anyway just due to the fact that it would be somewhat more cumbersome than just utilizing the dressing itself. Nonetheless that is definitely an option we discussed that further during the visit. She seems to be healing quite nicely. 06/03/18 on evaluation today patient appears to be doing well in  regard to her lower extremity ulcer. She has been tolerating the dressing changes without complication. Fortunately there is no sign of infection she did have some Slough noted on the surface of the wound today. Patient History Information obtained from Patient. Family History Heart Disease - Father,Paternal Grandparents, Hypertension - Father,Paternal Grandparents, No family history of Cancer, Diabetes, Hereditary Spherocytosis, Kidney Disease, Lung Disease, Seizures, Stroke, Thyroid Problems, Tuberculosis. Social History Never smoker, Marital Status - Married, Alcohol Use - Rarely, Drug Use - No History, Caffeine Use - Daily. Medical And Surgical History Notes Cardiovascular laser vein treatments and scleratherapy 2019 Fickel, Tarrah L. ([id number]) Musculoskeletal psoriasis Review of Systems (ROS) Constitutional Symptoms (General Health) Denies complaints or symptoms of Fever, Chills. Respiratory The patient has no complaints or symptoms. Cardiovascular The patient has no complaints or symptoms. Psychiatric The patient has no complaints or symptoms. Objective Constitutional Well-nourished and well-hydrated in no acute distress. Vitals Time Taken: 8:20 AM, Height: 64 in, Weight: 178 lbs, BMI: 30.6, Temperature: 98.1 F,  Pulse: 74 bpm, Respiratory Rate: 16 breaths/min, Blood Pressure: 131/67 mmHg. Respiratory normal breathing without difficulty. Psychiatric this patient is able to make decisions and demonstrates good insight into disease process. Alert and Oriented x 3. pleasant and cooperative. General Notes: Patient's wound bed currently shows evidence of fairly good granulation at this time she did have some Slough noted on the surface of the wound which did require sharp debridement at this point. With that being said post debridement the wound bed appears to be doing well and I do believe that she is making good progress. Integumentary (Hair, Skin) Wound #1 status is Open. Original cause of wound was Surgical Injury. The wound is located on the Left,Proximal,Medial Lower Leg. The wound measures 0.7cm length x 1cm width x 0.3cm depth; 0.55cm^2 area and 0.165cm^3 volume. There is Fat Layer (Subcutaneous Tissue) Exposed exposed. There is no tunneling or undermining noted. There is a medium amount of serous drainage noted. The wound margin is flat and intact. There is medium (34-66%) red granulation within the wound bed. There is a medium (34-66%) amount of necrotic tissue within the wound bed including Adherent Slough. The periwound skin appearance did not exhibit: Callus, Crepitus, Excoriation, Induration, Rash, Scarring, Dry/Scaly, Maceration, Atrophie Blanche, Cyanosis, Ecchymosis, Hemosiderin Staining, Mottled, Pallor, Rubor, Erythema. Periwound temperature was noted as No Abnormality. The periwound has tenderness on palpation. Assessment Active Problems Lomax, Angila L. ([id number]) ICD-10 Disruption of external operation (surgical) wound, not elsewhere classified, initial encounter Non-pressure chronic ulcer of other part of left lower leg with fat layer exposed Procedures Wound #1 Pre-procedure diagnosis of Wound #1 is an Open Surgical Wound located on the Left,Proximal,Medial Lower Leg . There  was a Excisional Skin/Subcutaneous Tissue Debridement with a total area of 0.87 sq cm performed by STONE III, Irem Stoneham E., PA-C. With the following instrument(s): Curette to remove Non-Viable tissue/material. Material removed includes Subcutaneous Tissue and Slough and after achieving pain control using Lidocaine. No specimens were taken. A time out was conducted at 08:45, prior to the start of the procedure. A Minimum amount of bleeding was controlled with Pressure. The procedure was tolerated well with a pain level of 0 throughout and a pain level of 0 following the procedure. Post Debridement Measurements: 0.7cm length x 1cm width x 0.3cm depth; 0.165cm^3 volume. Character of Wound/Ulcer Post Debridement is improved. Post procedure Diagnosis Wound #1: Same as Pre-Procedure Plan Wound Cleansing: May Shower, gently pat wound dry prior to applying new dressing. - Clean wound with Dial  antibacterial soap Primary Wound Dressing: Wound #1 Left,Proximal,Medial Lower Leg: Silver Collagen - Place collagen directly to wound bed, place alginate on top Drawtex - cut a piece just big enough to cover wound Secondary Dressing: Wound #1 Left,Proximal,Medial Lower Leg: Boardered Foam Dressing Dressing Change Frequency: Wound #1 Left,Proximal,Medial Lower Leg: Change dressing every other day. Follow-up Appointments: Wound #1 Left,Proximal,Medial Lower Leg: Return Appointment in 1 week. My suggestion currently is going to be that we go ahead and continue with the above wound care measures for the next week. The patient is in agreement the plan. Hopefully she will continue to make good progress. Please see above for specific wound care orders. We will see patient for re-evaluation in 1 week(s) here in the clinic. If anything worsens or changes patient will contact our office for additional recommendations. Electronic Signature(s) Signed: 06/03/2018 9:58:15 PM By: Irean Hong Proberta, BruceMarland Kitchen  ([id number]) Entered By: Worthy Keeler on 06/03/2018 11:31:14 Buccieri, [name] ([id number]) -------------------------------------------------------------------------------- ROS/PFSH Details Patient Name: [name] Date of Service: 06/03/2018 8:15 AM Medical Record Number: [id number] Patient Account Number: [id number] Date of Birth/Sex: 1961-01-11 (58 y.o. F) Treating RN: Harold Barban Primary Care Provider: Clayborn Bigness Other Clinician: Referring Provider: Clayborn Bigness Treating Provider/Extender: Melburn Hake, Katarina Riebe Weeks in Treatment: 4 Information Obtained From Patient Wound History Do you currently have one or more open woundso Yes How many open wounds do you currently haveo 1 Approximately how long have you had your woundso 5 weeks How have you been treating your wound(s) until nowo open to air Has your wound(s) ever healed and then re-openedo No Have you had any lab work done in the past montho No Have you tested positive for an antibiotic resistant organism (MRSA, VRE)o No Have you tested positive for osteomyelitis (bone infection)o No Have you had any tests for circulation on your legso No Constitutional Symptoms (General Health) Complaints and Symptoms: Negative for: Fever; Chills Eyes Medical History: Negative for: Cataracts; Glaucoma; Optic Neuritis Ear/Nose/Mouth/Throat Medical History: Negative for: Chronic sinus problems/congestion; Middle ear problems Hematologic/Lymphatic Medical History: Negative for: Anemia; Hemophilia; Human Immunodeficiency Virus; Lymphedema; Sickle Cell Disease Respiratory Complaints and Symptoms: No Complaints or Symptoms Medical History: Negative for: Aspiration; Asthma; Chronic Obstructive Pulmonary Disease (COPD); Pneumothorax; Sleep Apnea; Tuberculosis Cardiovascular Complaints and Symptoms: No Complaints or Symptoms Medical History: Negative for: Angina; Arrhythmia; Congestive Heart Failure; Coronary Artery Disease; Deep  Vein Thrombosis; Sawa, Shaida L. ([id number]) Hypertension; Hypotension; Myocardial Infarction; Peripheral Arterial Disease; Peripheral Venous Disease; Phlebitis; Vasculitis Past Medical History Notes: laser vein treatments and scleratherapy 2019 Gastrointestinal Medical History: Negative for: Cirrhosis ; Colitis; Crohnos; Hepatitis A; Hepatitis B; Hepatitis C Endocrine Medical History: Negative for: Type I Diabetes; Type II Diabetes Genitourinary Medical History: Negative for: End Stage Renal Disease Immunological Medical History: Negative for: Lupus Erythematosus; Raynaudos; Scleroderma Integumentary (Skin) Medical History: Negative for: History of Burn; History of pressure wounds Musculoskeletal Medical History: Negative for: Gout; Rheumatoid Arthritis; Osteoarthritis; Osteomyelitis Past Medical History Notes: psoriasis Neurologic Medical History: Negative for: Dementia; Neuropathy; Quadriplegia; Paraplegia; Seizure Disorder Oncologic Medical History: Negative for: Received Chemotherapy; Received Radiation Psychiatric Complaints and Symptoms: No Complaints or Symptoms Medical History: Negative for: Anorexia/bulimia; Confinement Anxiety Immunizations Pneumococcal Vaccine: Received Pneumococcal Vaccination: No Immunization Notes: Vassel, Suvi L. ([id number]) up to date Implantable Devices Family and Social History Cancer: No; Diabetes: No; Heart Disease: Yes - Father,Paternal Grandparents; Hereditary Spherocytosis: No; Hypertension: Yes - Father,Paternal Grandparents; Kidney Disease: No; Lung Disease: No; Seizures: No; Stroke: No; Thyroid Problems: No;  Tuberculosis: No; Never smoker; Marital Status - Married; Alcohol Use: Rarely; Drug Use: No History; Caffeine Use: Daily; Financial Concerns: No; Food, Clothing or Shelter Needs: No; Support System Lacking: No; Transportation Concerns: No; Advanced Directives: No; Patient does not want information on Advanced  Directives Physician Affirmation I have reviewed and agree with the above information. Electronic Signature(s) Signed: 06/03/2018 5:13:49 PM By: Harold Barban Signed: 06/03/2018 9:58:15 PM By: Worthy Keeler PA-C Entered By: Worthy Keeler on 06/03/2018 11:29:56 Macdonell, [name] ([id number]) -------------------------------------------------------------------------------- SuperBill Details Patient Name: [name] Date of Service: 06/03/2018 Medical Record Number: [id number] Patient Account Number: [id number] Date of Birth/Sex: January 18, 1961 (58 y.o. F) Treating RN: Harold Barban Primary Care Provider: Clayborn Bigness Other Clinician: Referring Provider: Clayborn Bigness Treating Provider/Extender: Melburn Hake, Dandrae Kustra Weeks in Treatment: 4 Diagnosis Coding ICD-10 Codes Code Description T81.31XA Disruption of external operation (surgical) wound, not elsewhere classified, initial encounter T81.31XA Non-pressure chronic ulcer of other part of left lower leg with fat layer exposed Facility Procedures CPT4 Code Description: [id number] 11042 - DEB SUBQ TISSUE 20 SQ CM/< ICD-10 Diagnosis Description L97.822 Non-pressure chronic ulcer of other part of left lower leg with Modifier: fat layer exposed Quantity: 1 Physician Procedures CPT4 Code Description: [id number] 11042 - WC PHYS SUBQ TISS 20 SQ CM ICD-10 Diagnosis Description L97.822 Non-pressure chronic ulcer of other part of left lower leg with Modifier: fat layer exposed Quantity: 1 Electronic Signature(s) Signed: 06/03/2018 9:58:15 PM By: Worthy Keeler PA-C Entered By: Worthy Keeler on 06/03/2018 11:31:22

## 2018-06-04 NOTE — Progress Notes (Signed)
KEM, PARCHER ([id number]) Visit Report for 06/03/2018 Arrival Information Details Patient Name: [name], [name] Date of Service: 06/03/2018 8:15 AM Medical Record Number: [id number] Patient Account Number: [id number] Date of Birth/Sex: [birth year] (58 y.o. F) Treating RN: Harold Barban Primary Care Ciana Simmon: Clayborn Bigness Other Clinician: Referring Merita Hawks: Clayborn Bigness Treating Shanora Christensen/Extender: Melburn Hake, HOYT Weeks in Treatment: 4 Visit Information History Since Last Visit Added or deleted any medications: No Patient Arrived: Ambulatory Any new allergies or adverse reactions: No Arrival Time: 08:18 Had a fall or experienced change in No Accompanied By: self activities of daily living that may affect Transfer Assistance: None risk of falls: Patient Identification Verified: Yes Signs or symptoms of abuse/neglect since last visito No Secondary Verification Process Completed: Yes Hospitalized since last visit: No Implantable device outside of the clinic excluding No cellular tissue based products placed in the center since last visit: Has Dressing in Place as Prescribed: Yes Pain Present Now: No Electronic Signature(s) Signed: 06/03/2018 3:56:35 PM By: Lorine Bears RCP, RRT, CHT Entered By: Lorine Bears on 06/03/2018 08:20:12 Petras, [name] ([id number]) -------------------------------------------------------------------------------- Encounter Discharge Information Details Patient Name: [name] Date of Service: 06/03/2018 8:15 AM Medical Record Number: [id number] Patient Account Number: [id number] Date of Birth/Sex: Mar 18, 1961 (57 y.o. F) Treating RN: Montey Hora Primary Care Maxi Carreras: Clayborn Bigness Other Clinician: Referring Giorgia Wahler: Clayborn Bigness Treating Tanuj Mullens/Extender: Melburn Hake, HOYT Weeks in Treatment: 4 Encounter Discharge Information Items Post Procedure Vitals Discharge Condition: Stable Temperature (F):  98.1 Ambulatory Status: Ambulatory Pulse (bpm): 74 Discharge Destination: Home Respiratory Rate (breaths/min): 16 Transportation: Private Auto Blood Pressure (mmHg): 131/67 Accompanied By: self Schedule Follow-up Appointment: Yes Clinical Summary of Care: Electronic Signature(s) Signed: 06/03/2018 3:56:47 PM By: Montey Hora Entered By: Montey Hora on 06/03/2018 08:59:53 Baranowski, [name] ([id number]) -------------------------------------------------------------------------------- Lower Extremity Assessment Details Patient Name: [name] Date of Service: 06/03/2018 8:15 AM Medical Record Number: [id number] Patient Account Number: [id number] Date of Birth/Sex: March 23, 1961 (57 y.o. F) Treating RN: Cornell Barman Primary Care Balian Schaller: Clayborn Bigness Other Clinician: Referring Donnita Farina: Clayborn Bigness Treating Jarius Dieudonne/Extender: Melburn Hake, HOYT Weeks in Treatment: 4 Edema Assessment Assessed: [Left: No] [Right: No] [Left: Edema] [Right: :] Calf Left: Right: Point of Measurement: 29 cm From Medial Instep 38 cm cm Ankle Left: Right: Point of Measurement: 12 cm From Medial Instep 21.5 cm cm Vascular Assessment Pulses: Dorsalis Pedis Palpable: [Left:Yes] Posterior Tibial Extremity colors, hair growth, and conditions: Extremity Color: [Left:Normal] Hair Growth on Extremity: [Left:Yes] Temperature of Extremity: [Left:Warm] Capillary Refill: [Left:< 3 seconds] Toe Nail Assessment Left: Right: Thick: No Discolored: No Deformed: No Improper Length and Hygiene: No Electronic Signature(s) Signed: 06/03/2018 5:04:52 PM By: Gretta Cool, BSN, RN, CWS, Kim RN, BSN Entered By: Gretta Cool, BSN, RN, CWS, Kim on 06/03/2018 08:26:21 Ochs, [name] ([id number]) -------------------------------------------------------------------------------- Multi Wound Chart Details Patient Name: [name] Date of Service: 06/03/2018 8:15 AM Medical Record Number: [id number] Patient Account Number:  [id number] Date of Birth/Sex: 1960/08/25 (57 y.o. F) Treating RN: Harold Barban Primary Care Claudius Mich: Clayborn Bigness Other Clinician: Referring Bena Kobel: Clayborn Bigness Treating Yoan Sallade/Extender: Melburn Hake, HOYT Weeks in Treatment: 4 Vital Signs Height(in): 64 Pulse(bpm): 74 Weight(lbs): 178 Blood Pressure(mmHg): 131/67 Body Mass Index(BMI): 31 Temperature(F): 98.1 Respiratory Rate 16 (breaths/min): Photos: [1:No Photos] [N/A:N/A] Wound Location: [1:Left Lower Leg - Medial, Proximal] [N/A:N/A] Wounding Event: [1:Surgical Injury] [N/A:N/A] Primary Etiology: [1:Open Surgical Wound] [N/A:N/A] Date Acquired: [1:04/02/2018] [N/A:N/A] Weeks of Treatment: [1:4] [N/A:N/A] Wound Status: [1:Open] [N/A:N/A] Measurements L x W x D [1:0.7x1x0.3] [N/A:N/A] (  cm) Area (cm) : [1:0.55] [N/A:N/A] Volume (cm) : [1:0.165] [N/A:N/A] % Reduction in Area: [1:27.10%] [N/A:N/A] % Reduction in Volume: [1:78.10%] [N/A:N/A] Classification: [1:Full Thickness Without Exposed Support Structures] [N/A:N/A] Exudate Amount: [1:Medium] [N/A:N/A] Exudate Type: [1:Serous] [N/A:N/A] Exudate Color: [1:amber] [N/A:N/A] Wound Margin: [1:Flat and Intact] [N/A:N/A] Granulation Amount: [1:Medium (34-66%)] [N/A:N/A] Granulation Quality: [1:Red] [N/A:N/A] Necrotic Amount: [1:Medium (34-66%)] [N/A:N/A] Exposed Structures: [1:Fat Layer (Subcutaneous Tissue) Exposed: Yes Fascia: No Tendon: No Muscle: No Joint: No Bone: No] [N/A:N/A] Epithelialization: [1:Small (1-33%)] [N/A:N/A] Periwound Skin Texture: [1:Excoriation: No Induration: No Callus: No Crepitus: No Rash: No Scarring: No] [N/A:N/A] Periwound Skin Moisture: Maceration: No N/A N/A Dry/Scaly: No Periwound Skin Color: Atrophie Blanche: No N/A N/A Cyanosis: No Ecchymosis: No Erythema: No Hemosiderin Staining: No Mottled: No Pallor: No Rubor: No Temperature: No Abnormality N/A N/A Tenderness on Palpation: Yes N/A N/A Wound Preparation: Ulcer Cleansing:  N/A N/A Rinsed/Irrigated with Saline Topical Anesthetic Applied: Other: lidocaine 4% Treatment Notes Electronic Signature(s) Signed: 06/03/2018 5:13:49 PM By: Harold Barban Entered By: Harold Barban on 06/03/2018 08:43:41 Impastato, [name] ([id number]) -------------------------------------------------------------------------------- [place] Details Patient Name: [name] Date of Service: 06/03/2018 8:15 AM Medical Record Number: [id number] Patient Account Number: [id number] Date of Birth/Sex: 1960-09-20 (58 y.o. F) Treating RN: Harold Barban Primary Care Ivori Storr: Clayborn Bigness Other Clinician: Referring Margurite Duffy: Clayborn Bigness Treating Torrell Krutz/Extender: Melburn Hake, HOYT Weeks in Treatment: 4 Active Inactive Wound/Skin Impairment Nursing Diagnoses: Knowledge deficit related to ulceration/compromised skin integrity Goals: Ulcer/skin breakdown will have a volume reduction of 30% by week 4 Date Initiated: 05/06/2018 Target Resolution Date: 06/06/2018 Goal Status: Active Interventions: Assess patient/caregiver ability to obtain necessary supplies Assess patient/caregiver ability to perform ulcer/skin care regimen upon admission and as needed Assess ulceration(s) every visit Notes: Electronic Signature(s) Signed: 06/03/2018 5:13:49 PM By: Harold Barban Entered By: Harold Barban on 06/03/2018 08:43:34 Tartaglia, [name] ([id number]) -------------------------------------------------------------------------------- Pain Assessment Details Patient Name: [name] Date of Service: 06/03/2018 8:15 AM Medical Record Number: [id number] Patient Account Number: [id number] Date of Birth/Sex: 04/04/1961 (58 y.o. F) Treating RN: Harold Barban Primary Care Paris Hohn: Clayborn Bigness Other Clinician: Referring Kensley Valladares: Clayborn Bigness Treating Braleigh Massoud/Extender: Melburn Hake, HOYT Weeks in Treatment: 4 Active Problems Location of Pain Severity and Description of  Pain Patient Has Paino No Site Locations Pain Management and Medication Current Pain Management: Electronic Signature(s) Signed: 06/03/2018 3:56:35 PM By: Lorine Bears RCP, RRT, CHT Signed: 06/03/2018 5:13:49 PM By: Harold Barban Entered By: Lorine Bears on 06/03/2018 08:20:21 [place], [name] ([id number]) -------------------------------------------------------------------------------- Patient/Caregiver Education Details Patient Name: [name] Date of Service: 06/03/2018 8:15 AM Medical Record Number: [id number] Patient Account Number: [id number] Date of Birth/Gender: [birth year] (58 y.o. F) Treating RN: Harold Barban Primary Care Physician: Clayborn Bigness Other Clinician: Referring Physician: Clayborn Bigness Treating Physician/Extender: Sharalyn Ink in Treatment: 4 Education Assessment Education Provided To: Patient Education Topics Provided Wound/Skin Impairment: Handouts: Caring for Your Ulcer Methods: Demonstration, Explain/Verbal Responses: State content correctly Electronic Signature(s) Signed: 06/03/2018 5:13:49 PM By: Harold Barban Entered By: Harold Barban on 06/03/2018 08:49:36 [place], [name] ([id number]) -------------------------------------------------------------------------------- Wound Assessment Details Patient Name: [name] Date of Service: 06/03/2018 8:15 AM Medical Record Number: [id number] Patient Account Number: [id number] Date of Birth/Sex: 08/04/1960 (58 y.o. F) Treating RN: Cornell Barman Primary Care Cari Burgo: Clayborn Bigness Other Clinician: Referring Itzabella Sorrels: Clayborn Bigness Treating Huriel Matt/Extender: Melburn Hake, HOYT Weeks in Treatment: 4 Wound Status Wound Number: 1 Primary Etiology: Open Surgical Wound Wound Location: Left Lower Leg - Medial, Proximal Wound  Status: Open Wounding Event: Surgical Injury Date Acquired: 04/02/2018 Weeks Of Treatment: 4 Clustered Wound: No Photos Photo  Uploaded By: Gretta Cool, BSN, RN, CWS, Kim on 06/03/2018 13:47:35 Wound Measurements Length: (cm) 0.7 Width: (cm) 1 Depth: (cm) 0.3 Area: (cm) 0.55 Volume: (cm) 0.165 % Reduction in Area: 27.1% % Reduction in Volume: 78.1% Epithelialization: Small (1-33%) Tunneling: No Undermining: No Wound Description Full Thickness Without Exposed Support Foul Odo Classification: Structures Slough/F Wound Margin: Flat and Intact Exudate Medium Amount: Exudate Type: Serous Exudate Color: amber r After Cleansing: No ibrino Yes Wound Bed Granulation Amount: Medium (34-66%) Exposed Structure Granulation Quality: Red Fascia Exposed: No Necrotic Amount: Medium (34-66%) Fat Layer (Subcutaneous Tissue) Exposed: Yes Necrotic Quality: Adherent Slough Tendon Exposed: No Muscle Exposed: No Joint Exposed: No Bone Exposed: No Blacklock, Brieonna L. ([id number]) Periwound Skin Texture Texture Color No Abnormalities Noted: No No Abnormalities Noted: No Callus: No Atrophie Blanche: No Crepitus: No Cyanosis: No Excoriation: No Ecchymosis: No Induration: No Erythema: No Rash: No Hemosiderin Staining: No Scarring: No Mottled: No Pallor: No Moisture Rubor: No No Abnormalities Noted: No Dry / Scaly: No Temperature / Pain Maceration: No Temperature: No Abnormality Tenderness on Palpation: Yes Wound Preparation Ulcer Cleansing: Rinsed/Irrigated with Saline Topical Anesthetic Applied: Other: lidocaine 4%, Treatment Notes Wound #1 (Left, Proximal, Medial Lower Leg) Notes Silver collagen to wound bed, apply alginate on top, secure with BFD. Electronic Signature(s) Signed: 06/03/2018 5:04:52 PM By: Gretta Cool, BSN, RN, CWS, Kim RN, BSN Entered By: Gretta Cool, BSN, RN, CWS, Kim on 06/03/2018 08:24:53 Wiens, [name] ([id number]) -------------------------------------------------------------------------------- [place] Details Patient Name: [name] Date of Service: 06/03/2018 8:15 AM Medical  Record Number: [id number] Patient Account Number: [id number] Date of Birth/Sex: 08-Sep-1960 (57 y.o. F) Treating RN: Harold Barban Primary Care Vernell Back: Clayborn Bigness Other Clinician: Referring Briyan Kleven: Clayborn Bigness Treating Carena Stream/Extender: Melburn Hake, HOYT Weeks in Treatment: 4 Vital Signs Time Taken: 08:20 Temperature (F): 98.1 Height (in): 64 Pulse (bpm): 74 Weight (lbs): 178 Respiratory Rate (breaths/min): 16 Body Mass Index (BMI): 30.6 Blood Pressure (mmHg): 131/67 Reference Range: 80 - 120 mg / dl Electronic Signature(s) Signed: 06/03/2018 3:56:35 PM By: Lorine Bears RCP, RRT, CHT Entered By: Lorine Bears on 06/03/2018 08:22:34

## 2018-06-05 ENCOUNTER — Encounter: Payer: Self-pay | Admitting: Nurse Practitioner

## 2018-06-05 ENCOUNTER — Encounter: Payer: Self-pay | Admitting: Adult Health

## 2018-06-10 ENCOUNTER — Encounter: Payer: Managed Care, Other (non HMO) | Admitting: Physician Assistant

## 2018-06-10 ENCOUNTER — Other Ambulatory Visit
Admission: RE | Admit: 2018-06-10 | Discharge: 2018-06-10 | Disposition: A | Discharge status: Home / Self Care | Payer: Managed Care, Other (non HMO) | Source: Ambulatory Visit | Attending: Physician Assistant | Admitting: Physician Assistant

## 2018-06-10 DIAGNOSIS — L089 Local infection of the skin and subcutaneous tissue, unspecified: Secondary | ICD-10-CM | POA: Diagnosis present

## 2018-06-10 DIAGNOSIS — L97822 Non-pressure chronic ulcer of other part of left lower leg with fat layer exposed: Secondary | ICD-10-CM | POA: Diagnosis not present

## 2018-06-16 LAB — AEROBIC/ANAEROBIC CULTURE (SURGICAL/DEEP WOUND): GRAM STAIN: NONE SEEN

## 2018-06-16 LAB — AEROBIC/ANAEROBIC CULTURE W GRAM STAIN (SURGICAL/DEEP WOUND)

## 2018-06-16 NOTE — Progress Notes (Signed)
Stacey, [name] ([id number]) Visit Report for 06/10/2018 Arrival Information Details Patient Name: [name], [name] Date of Service: 06/10/2018 11:15 AM Medical Record Number: [id number] Patient Account Number: [id number] Date of Birth/Sex: 10/30/1960 (58 y.o. F) Treating RN: Cornell Barman Primary Care Mariaelena Cade: Clayborn Bigness Other Clinician: Referring Jarvin Ogren: Clayborn Bigness Treating Laconda Basich/Extender: Melburn Hake, HOYT Weeks in Treatment: 5 Visit Information History Since Last Visit Added or deleted any medications: No Patient Arrived: Ambulatory Any new allergies or adverse reactions: No Arrival Time: 11:15 Had a fall or experienced change in No Accompanied By: self activities of daily living that may affect Transfer Assistance: None risk of falls: Patient Identification Verified: Yes Signs or symptoms of abuse/neglect since last visito No Secondary Verification Process Completed: Yes Hospitalized since last visit: No Implantable device outside of the clinic excluding No cellular tissue based products placed in the center since last visit: Has Dressing in Place as Prescribed: Yes Pain Present Now: No Electronic Signature(s) Signed: 06/10/2018 2:36:35 PM By: Lorine Bears RCP, RRT, CHT Entered By: Lorine Bears on 06/10/2018 11:16:41 Farace, [name] ([id number]) -------------------------------------------------------------------------------- Clinic Level of Care Assessment Details Patient Name: [name] Date of Service: 06/10/2018 11:15 AM Medical Record Number: [id number] Patient Account Number: [id number] Date of Birth/Sex: 1960-09-22 (58 y.o. F) Treating RN: Cornell Barman Primary Care Hunter Pinkard: Clayborn Bigness Other Clinician: Referring Sayid Moll: Clayborn Bigness Treating Lira Stephen/Extender: Melburn Hake, HOYT Weeks in Treatment: 5 Clinic Level of Care Assessment Items TOOL 4 Quantity Score []  - Use when only an EandM is performed on FOLLOW-UP visit  0 ASSESSMENTS - Nursing Assessment / Reassessment []  - Reassessment of Co-morbidities (includes updates in patient status) 0 X- 1 5 Reassessment of Adherence to Treatment Plan ASSESSMENTS - Wound and Skin Assessment / Reassessment X - Simple Wound Assessment / Reassessment - one wound 1 5 []  - 0 Complex Wound Assessment / Reassessment - multiple wounds []  - 0 Dermatologic / Skin Assessment (not related to wound area) ASSESSMENTS - Focused Assessment []  - Circumferential Edema Measurements - multi extremities 0 []  - 0 Nutritional Assessment / Counseling / Intervention []  - 0 Lower Extremity Assessment (monofilament, tuning fork, pulses) []  - 0 Peripheral Arterial Disease Assessment (using hand held doppler) ASSESSMENTS - Ostomy and/or Continence Assessment and Care []  - Incontinence Assessment and Management 0 []  - 0 Ostomy Care Assessment and Management (repouching, etc.) PROCESS - Coordination of Care X - Simple Patient / Family Education for ongoing care 1 15 []  - 0 Complex (extensive) Patient / Family Education for ongoing care X- 1 10 Staff obtains Programmer, systems, Records, Test Results / Process Orders []  - 0 Staff telephones HHA, Nursing Homes / Clarify orders / etc []  - 0 Routine Transfer to another Facility (non-emergent condition) []  - 0 Routine Hospital Admission (non-emergent condition) []  - 0 New Admissions / Biomedical engineer / Ordering NPWT, Apligraf, etc. []  - 0 Emergency Hospital Admission (emergent condition) X- 1 10 Simple Discharge Coordination Freer, Glendia L. ([id number]) []  - 0 Complex (extensive) Discharge Coordination PROCESS - Special Needs []  - Pediatric / Minor Patient Management 0 []  - 0 Isolation Patient Management []  - 0 Hearing / Language / Visual special needs []  - 0 Assessment of Community assistance (transportation, D/C planning, etc.) []  - 0 Additional assistance / Altered mentation []  - 0 Support Surface(s) Assessment (bed,  cushion, seat, etc.) INTERVENTIONS - Wound Cleansing / Measurement X - Simple Wound Cleansing - one wound 1 5 []  - 0 Complex Wound Cleansing - multiple wounds X-  1 5 Wound Imaging (photographs - any number of wounds) []  - 0 Wound Tracing (instead of photographs) X- 1 5 Simple Wound Measurement - one wound []  - 0 Complex Wound Measurement - multiple wounds INTERVENTIONS - Wound Dressings []  - Small Wound Dressing one or multiple wounds 0 X- 1 15 Medium Wound Dressing one or multiple wounds []  - 0 Large Wound Dressing one or multiple wounds []  - 0 Application of Medications - topical []  - 0 Application of Medications - injection INTERVENTIONS - Miscellaneous []  - External ear exam 0 X- 1 5 Specimen Collection (cultures, biopsies, blood, body fluids, etc.) X- 1 5 Specimen(s) / Culture(s) sent or taken to Lab for analysis []  - 0 Patient Transfer (multiple staff / Civil Service fast streamer / Similar devices) []  - 0 Simple Staple / Suture removal (25 or less) []  - 0 Complex Staple / Suture removal (26 or more) []  - 0 Hypo / Hyperglycemic Management (close monitor of Blood Glucose) []  - 0 Ankle / Brachial Index (ABI) - do not check if billed separately X- 1 5 Vital Signs Remer, Corene L. ([id number]) Has the patient been seen at the hospital within the last three years: Yes Total Score: 90 Level Of Care: New/Established - Level 3 Electronic Signature(s) Signed: 06/10/2018 4:45:46 PM By: Gretta Cool, BSN, RN, CWS, Kim RN, BSN Entered By: Gretta Cool, BSN, RN, CWS, Kim on 06/10/2018 11:41:21 [place], [name] ([id number]) -------------------------------------------------------------------------------- Encounter Discharge Information Details Patient Name: [name] Date of Service: 06/10/2018 11:15 AM Medical Record Number: [id number] Patient Account Number: [id number] Date of Birth/Sex: 12/21/1960 (57 y.o. F) Treating RN: Cornell Barman Primary Care Briauna Gilmartin: Clayborn Bigness Other  Clinician: Referring Mckinley Adelstein: Clayborn Bigness Treating Jaxsen Bernhart/Extender: Melburn Hake, HOYT Weeks in Treatment: 5 Encounter Discharge Information Items Discharge Condition: Stable Ambulatory Status: Ambulatory Discharge Destination: Home Transportation: Private Auto Accompanied By: self Schedule Follow-up Appointment: Yes Clinical Summary of Care: Electronic Signature(s) Signed: 06/10/2018 11:52:48 AM By: Gretta Cool, BSN, RN, CWS, Kim RN, BSN Entered By: Gretta Cool, BSN, RN, CWS, Kim on 06/10/2018 11:52:47 [place], [name] ([id number]) -------------------------------------------------------------------------------- Lower Extremity Assessment Details Patient Name: [name] Date of Service: 06/10/2018 11:15 AM Medical Record Number: [id number] Patient Account Number: [id number] Date of Birth/Sex: [birth year] (57 y.o. F) Treating RN: Secundino Ginger Primary Care Shaughn Thomley: Clayborn Bigness Other Clinician: Referring Ajeenah Heiny: Clayborn Bigness Treating Nerine Pulse/Extender: Melburn Hake, HOYT Weeks in Treatment: 5 Edema Assessment Assessed: [Left: No] [Right: No] [Left: Edema] [Right: :] Calf Left: Right: Point of Measurement: 29 cm From Medial Instep 38.5 cm cm Ankle Left: Right: Point of Measurement: 12 cm From Medial Instep 20 cm cm Vascular Assessment Claudication: Claudication Assessment [Left:None] Pulses: Dorsalis Pedis Palpable: [Left:Yes] Posterior Tibial Extremity colors, hair growth, and conditions: Extremity Color: [Left:Hyperpigmented] Hair Growth on Extremity: [Left:No] Temperature of Extremity: [Left:Warm] Capillary Refill: [Left:< 3 seconds] Toe Nail Assessment Left: Right: Thick: No Discolored: No Deformed: No Improper Length and Hygiene: No Electronic Signature(s) Signed: 06/10/2018 2:52:40 PM By: Secundino Ginger Entered By: Secundino Ginger on 06/10/2018 11:28:42 [place], [name] ([id number]) -------------------------------------------------------------------------------- Multi Wound  Chart Details Patient Name: [name] Date of Service: 06/10/2018 11:15 AM Medical Record Number: [id number] Patient Account Number: [id number] Date of Birth/Sex: [birth year] (58 y.o. F) Treating RN: Cornell Barman Primary Care Deziyah Arvin: Clayborn Bigness Other Clinician: Referring Nayelie Gionfriddo: Clayborn Bigness Treating Churchill Grimsley/Extender: Melburn Hake, HOYT Weeks in Treatment: 5 Vital Signs Height(in): 64 Pulse(bpm): 70 Weight(lbs): 178 Blood Pressure(mmHg): 121/87 Body Mass Index(BMI): 31 Temperature(F): 98.2 Respiratory Rate 16 (breaths/min): Photos: [N/A:N/A] Wound Location:  Left Lower Leg - Medial, N/A N/A Proximal Wounding Event: Surgical Injury N/A N/A Primary Etiology: Open Surgical Wound N/A N/A Date Acquired: 04/02/2018 N/A N/A Weeks of Treatment: 5 N/A N/A Wound Status: Open N/A N/A Measurements L x W x D 0.7x1x0.3 N/A N/A (cm) Area (cm) : 0.55 N/A N/A Volume (cm) : 0.165 N/A N/A % Reduction in Area: 27.10% N/A N/A % Reduction in Volume: 78.10% N/A N/A Classification: Full Thickness Without N/A N/A Exposed Support Structures Exudate Amount: Medium N/A N/A Exudate Type: Serous N/A N/A Exudate Color: amber N/A N/A Wound Margin: Flat and Intact N/A N/A Granulation Amount: Small (1-33%) N/A N/A Granulation Quality: Red N/A N/A Necrotic Amount: Medium (34-66%) N/A N/A Exposed Structures: Fat Layer (Subcutaneous N/A N/A Tissue) Exposed: Yes Fascia: No Tendon: No Muscle: No Joint: No Bone: No Mumford, Leathia L. ([id number]) Epithelialization: Small (1-33%) N/A N/A Periwound Skin Texture: Excoriation: No N/A N/A Induration: No Callus: No Crepitus: No Rash: No Scarring: No Periwound Skin Moisture: Maceration: No N/A N/A Dry/Scaly: No Periwound Skin Color: Erythema: Yes N/A N/A Atrophie Blanche: No Cyanosis: No Ecchymosis: No Hemosiderin Staining: No Mottled: No Pallor: No Rubor: No Erythema Location: Circumferential N/A N/A Temperature: No Abnormality N/A  N/A Tenderness on Palpation: Yes N/A N/A Wound Preparation: Ulcer Cleansing: N/A N/A Rinsed/Irrigated with Saline Topical Anesthetic Applied: Other: lidocaine 4% Treatment Notes Electronic Signature(s) Signed: 06/10/2018 4:45:46 PM By: Gretta Cool, BSN, RN, CWS, Kim RN, BSN Entered By: Gretta Cool, BSN, RN, CWS, Kim on 06/10/2018 11:38:28 [place], [name] ([id number]) -------------------------------------------------------------------------------- [place] Details Patient Name: [name] Date of Service: 06/10/2018 11:15 AM Medical Record Number: [id number] Patient Account Number: [id number] Date of Birth/Sex: 30-Jan-1961 (57 y.o. F) Treating RN: Cornell Barman Primary Care Maeghan Canny: Clayborn Bigness Other Clinician: Referring Brandilee Pies: Clayborn Bigness Treating Garnell Begeman/Extender: Melburn Hake, HOYT Weeks in Treatment: 5 Active Inactive Necrotic Tissue Nursing Diagnoses: Impaired tissue integrity related to necrotic/devitalized tissue Goals: Necrotic/devitalized tissue will be minimized in the wound bed Date Initiated: 06/10/2018 Target Resolution Date: 06/21/2018 Goal Status: Active Interventions: Assess patient pain level pre-, during and post procedure and prior to discharge Treatment Activities: Apply topical anesthetic as ordered : 06/10/2018 Notes: Wound/Skin Impairment Nursing Diagnoses: Knowledge deficit related to ulceration/compromised skin integrity Goals: Ulcer/skin breakdown will have a volume reduction of 30% by week 4 Date Initiated: 05/06/2018 Target Resolution Date: 06/06/2018 Goal Status: Active Interventions: Assess patient/caregiver ability to obtain necessary supplies Assess patient/caregiver ability to perform ulcer/skin care regimen upon admission and as needed Assess ulceration(s) every visit Notes: Electronic Signature(s) Signed: 06/10/2018 4:45:46 PM By: Gretta Cool, BSN, RN, CWS, Kim RN, BSN Entered By: Gretta Cool, BSN, RN, CWS, Kim on 06/10/2018  11:38:12 [place], [name] ([id number]) -------------------------------------------------------------------------------- Pain Assessment Details Patient Name: [name] Date of Service: 06/10/2018 11:15 AM Medical Record Number: [id number] Patient Account Number: [id number] Date of Birth/Sex: 13-Sep-1960 (58 y.o. F) Treating RN: Cornell Barman Primary Care Joud Ingwersen: Clayborn Bigness Other Clinician: Referring Caymen Dubray: Clayborn Bigness Treating Janus Vlcek/Extender: Melburn Hake, HOYT Weeks in Treatment: 5 Active Problems Location of Pain Severity and Description of Pain Patient Has Paino No Site Locations Pain Management and Medication Current Pain Management: Electronic Signature(s) Signed: 06/10/2018 2:36:35 PM By: Lorine Bears RCP, RRT, CHT Signed: 06/10/2018 4:45:46 PM By: Gretta Cool, BSN, RN, CWS, Kim RN, BSN Entered By: Lorine Bears on 06/10/2018 11:16:50 [place], [name] ([id number]) -------------------------------------------------------------------------------- Patient/Caregiver Education Details Patient Name: [name] Date of Service: 06/10/2018 11:15 AM Medical Record Number: [id number] Patient Account Number: [id number] Date  of Birth/Gender: [birth year] (58 y.o. F) Treating RN: Cornell Barman Primary Care Physician: Clayborn Bigness Other Clinician: Referring Physician: Clayborn Bigness Treating Physician/Extender: Sharalyn Ink in Treatment: 5 Education Assessment Education Provided To: Patient Education Topics Provided Infection: Handouts: Infection Prevention and Management Methods: Demonstration, Explain/Verbal Responses: State content correctly Wound/Skin Impairment: Handouts: Caring for Your Ulcer Methods: Demonstration, Explain/Verbal Responses: State content correctly Electronic Signature(s) Signed: 06/10/2018 4:45:46 PM By: Gretta Cool, BSN, RN, CWS, Kim RN, BSN Entered By: Gretta Cool, BSN, RN, CWS, Kim on 06/10/2018 11:44:28 [place], [name]  ([id number]) -------------------------------------------------------------------------------- Wound Assessment Details Patient Name: [name] Date of Service: 06/10/2018 11:15 AM Medical Record Number: [id number] Patient Account Number: [id number] Date of Birth/Sex: 1960-08-24 (57 y.o. F) Treating RN: Secundino Ginger Primary Care Milina Pagett: Clayborn Bigness Other Clinician: Referring Caisen Mangas: Clayborn Bigness Treating Oma Marzan/Extender: Melburn Hake, HOYT Weeks in Treatment: 5 Wound Status Wound Number: 1 Primary Etiology: Open Surgical Wound Wound Location: Left Lower Leg - Medial, Proximal Wound Status: Open Wounding Event: Surgical Injury Date Acquired: 04/02/2018 Weeks Of Treatment: 5 Clustered Wound: No Photos Photo Uploaded By: Secundino Ginger on 06/10/2018 11:35:39 Wound Measurements Length: (cm) 0.7 Width: (cm) 1 Depth: (cm) 0.3 Area: (cm) 0.55 Volume: (cm) 0.165 % Reduction in Area: 27.1% % Reduction in Volume: 78.1% Epithelialization: Small (1-33%) Tunneling: No Undermining: No Wound Description Full Thickness Without Exposed Support Foul Od Classification: Structures Slough/ Wound Margin: Flat and Intact Exudate Medium Amount: Exudate Type: Serous Exudate Color: amber or After Cleansing: No Fibrino Yes Wound Bed Granulation Amount: Small (1-33%) Exposed Structure Granulation Quality: Red Fascia Exposed: No Necrotic Amount: Medium (34-66%) Fat Layer (Subcutaneous Tissue) Exposed: Yes Necrotic Quality: Adherent Slough Tendon Exposed: No Muscle Exposed: No Joint Exposed: No Bone Exposed: No Asa, Breeonna L. ([id number]) Periwound Skin Texture Texture Color No Abnormalities Noted: No No Abnormalities Noted: No Callus: No Atrophie Blanche: No Crepitus: No Cyanosis: No Excoriation: No Ecchymosis: No Induration: No Erythema: Yes Rash: No Erythema Location: Circumferential Scarring: No Hemosiderin Staining: No Mottled: No Moisture Pallor: No No  Abnormalities Noted: No Rubor: No Dry / Scaly: No Maceration: No Temperature / Pain Temperature: No Abnormality Tenderness on Palpation: Yes Wound Preparation Ulcer Cleansing: Rinsed/Irrigated with Saline Topical Anesthetic Applied: Other: lidocaine 4%, Treatment Notes Wound #1 (Left, Proximal, Medial Lower Leg) Notes Mupiricin secure with BFD. Electronic Signature(s) Signed: 06/10/2018 2:52:40 PM By: Secundino Ginger Entered By: Secundino Ginger on 06/10/2018 11:29:30 [place], [name] ([id number]) -------------------------------------------------------------------------------- [place] Details Patient Name: [name] Date of Service: 06/10/2018 11:15 AM Medical Record Number: [id number] Patient Account Number: [id number] Date of Birth/Sex: Jan 04, 1961 (57 y.o. F) Treating RN: Cornell Barman Primary Care Ling Flesch: Clayborn Bigness Other Clinician: Referring Ardean Simonich: Clayborn Bigness Treating Bibiana Gillean/Extender: Melburn Hake, HOYT Weeks in Treatment: 5 Vital Signs Time Taken: 11:16 Temperature (F): 98.2 Height (in): 64 Pulse (bpm): 70 Weight (lbs): 178 Respiratory Rate (breaths/min): 16 Body Mass Index (BMI): 30.6 Blood Pressure (mmHg): 121/87 Reference Range: 80 - 120 mg / dl Airway Electronic Signature(s) Signed: 06/10/2018 2:36:35 PM By: Lorine Bears RCP, RRT, CHT Entered By: Lorine Bears on 06/10/2018 11:19:59

## 2018-06-17 ENCOUNTER — Encounter: Payer: Managed Care, Other (non HMO) | Attending: Physician Assistant | Admitting: Physician Assistant

## 2018-06-17 DIAGNOSIS — L97822 Non-pressure chronic ulcer of other part of left lower leg with fat layer exposed: Secondary | ICD-10-CM | POA: Diagnosis not present

## 2018-06-17 DIAGNOSIS — I10 Essential (primary) hypertension: Secondary | ICD-10-CM | POA: Insufficient documentation

## 2018-06-17 DIAGNOSIS — E11622 Type 2 diabetes mellitus with other skin ulcer: Secondary | ICD-10-CM | POA: Insufficient documentation

## 2018-06-17 DIAGNOSIS — I252 Old myocardial infarction: Secondary | ICD-10-CM | POA: Insufficient documentation

## 2018-06-17 DIAGNOSIS — E1151 Type 2 diabetes mellitus with diabetic peripheral angiopathy without gangrene: Secondary | ICD-10-CM | POA: Diagnosis not present

## 2018-06-17 DIAGNOSIS — Z8249 Family history of ischemic heart disease and other diseases of the circulatory system: Secondary | ICD-10-CM | POA: Insufficient documentation

## 2018-06-21 NOTE — Progress Notes (Signed)
Stacey, [name] ([id number]) Visit Report for 06/17/2018 Chief Complaint Document Details Patient Name: [name], [name] Date of Service: 06/17/2018 9:15 AM Medical Record Number: [id number] Patient Account Number: [id number] Date of Birth/Sex: 03/18/1961 (58 y.o. F) Treating RN: Harold Barban Primary Care Provider: Clayborn Bigness Other Clinician: Referring Provider: Clayborn Bigness Treating Provider/Extender: Melburn Hake, Buster Schueller Weeks in Treatment: 6 Information Obtained from: Patient Chief Complaint Left lower leg surgical ulcer Electronic Signature(s) Signed: 06/18/2018 8:18:44 AM By: Worthy Keeler PA-C Entered By: Worthy Keeler on 06/17/2018 09:17:59 [place], [name] ([id number]) -------------------------------------------------------------------------------- HPI Details Patient Name: [name] Date of Service: 06/17/2018 9:15 AM Medical Record Number: [id number] Patient Account Number: [id number] Date of Birth/Sex: 11/22/1960 (57 y.o. F) Treating RN: Harold Barban Primary Care Provider: Clayborn Bigness Other Clinician: Referring Provider: Clayborn Bigness Treating Provider/Extender: Melburn Hake, Alven Alverio Weeks in Treatment: 6 History of Present Illness HPI Description: 05/06/18 on evaluation today patient presents for initial inspection in our clinic concerning an issue that she has been having on her lower extremity as result of a initially biopsy and then subsequently excision of a precancerous lesion which was undertaken by dermatology on 04/02/18. I do not have the pathology report available today for my valuation unfortunately. Nonetheless we are gonna work on getting this from Dr. Elveria Rising office. The patient states that since this was removed and was quite deep sutures were attempted internally to help pull things together and then the area was pulled together utilizing Steri-Strips. Unfortunately it appears that the suture closure but Steri-Strips did not taken the patient  is currently having issues with a dehisced wound where there is significant slough buildup internally. Nonetheless I do believe that this is something that may benefit from sharp debridement and good wound care at this time. Fortunately there is no sign of infection currently. No fevers, chills, nausea, or vomiting noted at this time. The patient is here is a referral from her primary care provider. 05/20/18 on evaluation today patient actually appears to be doing rather well in regard to her lower extremity ulcer. The area of skin that was connecting the small opening to the larger main opening is actually for me now as I expected it likely would. This likely means that we're gonna need to go ahead and see about trimming this area off which I think is the most perfect and help this area to heal appropriately. The patient is in agreement with this plan today. Other than that there's no sign of infection she seems to be doing very well. 05/27/18 on evaluation today patient actually appears to be doing very well in regard to her leg ulcer. The good news is we did get approval for the Snap Vac. With that being said she seems to be doing so well I'm not even certain that this is going to become necessary. She's not quite sure she would want to proceed with this anyway just due to the fact that it would be somewhat more cumbersome than just utilizing the dressing itself. Nonetheless that is definitely an option we discussed that further during the visit. She seems to be healing quite nicely. 06/03/18 on evaluation today patient appears to be doing well in regard to her lower extremity ulcer. She has been tolerating the dressing changes without complication. Fortunately there is no sign of infection she did have some Slough noted on the surface of the wound today. 06/10/18 on evaluation today patient's wound actually appears to be showing some signs of infection unfortunately.  She's also having a little bit  of increased pain which has me somewhat concerned as well. No fevers, chills, nausea, or vomiting noted at this time. 06/17/18 on evaluation today patient actually appears to be doing very well in regard to her lower Trinity ulcer. In fact this seems to be showing signs of excellent improvement when compared to last week she's not having the pain she was having last week and overall this is excellent news. I'm very pleased with how things have progressed. With that being said I do think that we may just want to keep with the and about equipment at this point since she has done so well I did add support was well attended last week for her based on the culture results. It did show that she had a Pseudomonas infection as well. Electronic Signature(s) Signed: 06/18/2018 8:18:44 AM By: Worthy Keeler PA-C Entered By: Worthy Keeler on 06/17/2018 09:56:47 [place], [name] ([id number]) -------------------------------------------------------------------------------- Physical Exam Details Patient Name: [name] Date of Service: 06/17/2018 9:15 AM Medical Record Number: [id number] Patient Account Number: [id number] Date of Birth/Sex: 1960-12-26 (57 y.o. F) Treating RN: Harold Barban Primary Care Provider: Clayborn Bigness Other Clinician: Referring Provider: Clayborn Bigness Treating Provider/Extender: STONE III, Johanthan Kneeland Weeks in Treatment: 6 Constitutional Well-nourished and well-hydrated in no acute distress. Respiratory normal breathing without difficulty. clear to auscultation bilaterally. Cardiovascular regular rate and rhythm with normal S1, S2. Psychiatric this patient is able to make decisions and demonstrates good insight into disease process. Alert and Oriented x 3. pleasant and cooperative. Notes Patient's wound bed currently shows signs of good granulation at this time there's no significant slough buildup which is good news. I was able to clean this office with saline and gauze today.  Overall I think she's very close to complete resolution which is excellent news. Electronic Signature(s) Signed: 06/18/2018 8:18:44 AM By: Worthy Keeler PA-C Entered By: Worthy Keeler on 06/17/2018 09:57:25 [place], [name] ([id number]) -------------------------------------------------------------------------------- Physician Orders Details Patient Name: [name] Date of Service: 06/17/2018 9:15 AM Medical Record Number: [id number] Patient Account Number: [id number] Date of Birth/Sex: 10-Jul-1960 (57 y.o. F) Treating RN: Harold Barban Primary Care Provider: Clayborn Bigness Other Clinician: Referring Provider: Clayborn Bigness Treating Provider/Extender: Melburn Hake, Berdella Bacot Weeks in Treatment: 6 Verbal / Phone Orders: No Diagnosis Coding ICD-10 Coding Code Description T81.31XA Disruption of external operation (surgical) wound, not elsewhere classified, initial encounter L97.822 Non-pressure chronic ulcer of other part of left lower leg with fat layer exposed Wound Cleansing o May Shower, gently pat wound dry prior to applying new dressing. - Clean wound with Dial antibacterial soap Anesthetic (add to Medication List) Wound #1 Left,Proximal,Medial Lower Leg o Topical Lidocaine 4% cream applied to wound bed prior to debridement (In Clinic Only). Skin Barriers/Peri-Wound Care Wound #1 Left,Proximal,Medial Lower Leg o Other: - Mupiricin ointment Secondary Dressing Wound #1 Left,Proximal,Medial Lower Leg o Boardered Foam Dressing Dressing Change Frequency Wound #1 Left,Proximal,Medial Lower Leg o Change dressing every day. Follow-up Appointments Wound #1 Left,Proximal,Medial Lower Leg o Return Appointment in 1 week. Medications-please add to medication list. Wound #1 Left,Proximal,Medial Lower Leg o Topical Antibiotic Electronic Signature(s) Signed: 06/18/2018 8:18:44 AM By: Worthy Keeler PA-C Signed: 06/20/2018 11:43:02 AM By: Harold Barban Entered By: Harold Barban on 06/17/2018 09:54:16 Dosanjh, [name] ([id number]) -------------------------------------------------------------------------------- Problem List Details Patient Name: [name] Date of Service: 06/17/2018 9:15 AM Medical Record Number: [id number] Patient Account Number: [id number] Date of Birth/Sex: 20-Dec-1960 (58 y.o. F) Treating  RN: Harold Barban Primary Care Provider: Clayborn Bigness Other Clinician: Referring Provider: Clayborn Bigness Treating Provider/Extender: Melburn Hake, Darlinda Bellows Weeks in Treatment: 6 Active Problems ICD-10 Evaluated Encounter Code Description Active Date Today Diagnosis T81.31XA Disruption of external operation (surgical) wound, not 05/06/2018 No Yes elsewhere classified, initial encounter L97.822 Non-pressure chronic ulcer of other part of left lower leg with 05/06/2018 No Yes fat layer exposed Inactive Problems Resolved Problems Electronic Signature(s) Signed: 06/18/2018 8:18:44 AM By: Worthy Keeler PA-C Entered By: Worthy Keeler on 06/17/2018 09:17:49 Monday, [name] ([id number]) -------------------------------------------------------------------------------- Progress Note Details Patient Name: [name] Date of Service: 06/17/2018 9:15 AM Medical Record Number: [id number] Patient Account Number: [id number] Date of Birth/Sex: Nov 11, 1960 (57 y.o. F) Treating RN: Harold Barban Primary Care Provider: Clayborn Bigness Other Clinician: Referring Provider: Clayborn Bigness Treating Provider/Extender: Melburn Hake, Mickey Esguerra Weeks in Treatment: 6 Subjective Chief Complaint Information obtained from Patient Left lower leg surgical ulcer History of Present Illness (HPI) 05/06/18 on evaluation today patient presents for initial inspection in our clinic concerning an issue that she has been having on her lower extremity as result of a initially biopsy and then subsequently excision of a precancerous lesion which was undertaken by dermatology on 04/02/18.  I do not have the pathology report available today for my valuation unfortunately. Nonetheless we are gonna work on getting this from Dr. Elveria Rising office. The patient states that since this was removed and was quite deep sutures were attempted internally to help pull things together and then the area was pulled together utilizing Steri-Strips. Unfortunately it appears that the suture closure but Steri-Strips did not taken the patient is currently having issues with a dehisced wound where there is significant slough buildup internally. Nonetheless I do believe that this is something that may benefit from sharp debridement and good wound care at this time. Fortunately there is no sign of infection currently. No fevers, chills, nausea, or vomiting noted at this time. The patient is here is a referral from her primary care provider. 05/20/18 on evaluation today patient actually appears to be doing rather well in regard to her lower extremity ulcer. The area of skin that was connecting the small opening to the larger main opening is actually for me now as I expected it likely would. This likely means that we're gonna need to go ahead and see about trimming this area off which I think is the most perfect and help this area to heal appropriately. The patient is in agreement with this plan today. Other than that there's no sign of infection she seems to be doing very well. 05/27/18 on evaluation today patient actually appears to be doing very well in regard to her leg ulcer. The good news is we did get approval for the Snap Vac. With that being said she seems to be doing so well I'm not even certain that this is going to become necessary. She's not quite sure she would want to proceed with this anyway just due to the fact that it would be somewhat more cumbersome than just utilizing the dressing itself. Nonetheless that is definitely an option we discussed that further during the visit. She seems to be healing  quite nicely. 06/03/18 on evaluation today patient appears to be doing well in regard to her lower extremity ulcer. She has been tolerating the dressing changes without complication. Fortunately there is no sign of infection she did have some Slough noted on the surface of the wound today. 06/10/18 on evaluation today patient's  wound actually appears to be showing some signs of infection unfortunately. She's also having a little bit of increased pain which has me somewhat concerned as well. No fevers, chills, nausea, or vomiting noted at this time. 06/17/18 on evaluation today patient actually appears to be doing very well in regard to her lower Trinity ulcer. In fact this seems to be showing signs of excellent improvement when compared to last week she's not having the pain she was having last week and overall this is excellent news. I'm very pleased with how things have progressed. With that being said I do think that we may just want to keep with the and about equipment at this point since she has done so well I did add support was well attended last week for her based on the culture results. It did show that she had a Pseudomonas infection as well. Patient History Information obtained from Patient. Family History Lasky, EMELLY WURTZ. ([id number]) Heart Disease - Father,Paternal Grandparents, Hypertension - Father,Paternal Grandparents, No family history of Cancer, Diabetes, Hereditary Spherocytosis, Kidney Disease, Lung Disease, Seizures, Stroke, Thyroid Problems, Tuberculosis. Social History Never smoker, Marital Status - Married, Alcohol Use - Rarely, Drug Use - No History, Caffeine Use - Daily. Medical History Eyes Denies history of Cataracts, Glaucoma, Optic Neuritis Ear/Nose/Mouth/Throat Denies history of Chronic sinus problems/congestion, Middle ear problems Hematologic/Lymphatic Denies history of Anemia, Hemophilia, Human Immunodeficiency Virus, Lymphedema, Sickle Cell  Disease Respiratory Denies history of Aspiration, Asthma, Chronic Obstructive Pulmonary Disease (COPD), Pneumothorax, Sleep Apnea, Tuberculosis Cardiovascular Denies history of Angina, Arrhythmia, Congestive Heart Failure, Coronary Artery Disease, Deep Vein Thrombosis, Hypertension, Hypotension, Myocardial Infarction, Peripheral Arterial Disease, Peripheral Venous Disease, Phlebitis, Vasculitis Gastrointestinal Denies history of Cirrhosis , Colitis, Crohn s, Hepatitis A, Hepatitis B, Hepatitis C Endocrine Denies history of Type I Diabetes, Type II Diabetes Genitourinary Denies history of End Stage Renal Disease Immunological Denies history of Lupus Erythematosus, Raynaud s, Scleroderma Integumentary (Skin) Denies history of History of Burn, History of pressure wounds Musculoskeletal Denies history of Gout, Rheumatoid Arthritis, Osteoarthritis, Osteomyelitis Neurologic Denies history of Dementia, Neuropathy, Quadriplegia, Paraplegia, Seizure Disorder Oncologic Denies history of Received Chemotherapy, Received Radiation Psychiatric Denies history of Anorexia/bulimia, Confinement Anxiety Medical And Surgical History Notes Cardiovascular laser vein treatments and scleratherapy 2019 Musculoskeletal psoriasis Review of Systems (ROS) Constitutional Symptoms (General Health) Denies complaints or symptoms of Fever, Chills. Respiratory The patient has no complaints or symptoms. Cardiovascular The patient has no complaints or symptoms. Psychiatric The patient has no complaints or symptoms. Russum, Fortunata L. ([id number]) Objective Constitutional Well-nourished and well-hydrated in no acute distress. Vitals Time Taken: 9:26 AM, Height: 64 in, Weight: 178 lbs, BMI: 30.6, Temperature: 98.2 F, Pulse: 77 bpm, Respiratory Rate: 16 breaths/min, Blood Pressure: 123/84 mmHg. Respiratory normal breathing without difficulty. clear to auscultation bilaterally. Cardiovascular regular rate  and rhythm with normal S1, S2. Psychiatric this patient is able to make decisions and demonstrates good insight into disease process. Alert and Oriented x 3. pleasant and cooperative. General Notes: Patient's wound bed currently shows signs of good granulation at this time there's no significant slough buildup which is good news. I was able to clean this office with saline and gauze today. Overall I think she's very close to complete resolution which is excellent news. Integumentary (Hair, Skin) Wound #1 status is Open. Original cause of wound was Surgical Injury. The wound is located on the Left,Proximal,Medial Lower Leg. The wound measures 0.4cm length x 0.6cm width x 0.2cm depth; 0.188cm^2 area and 0.038cm^3 volume. There is Fat  Layer (Subcutaneous Tissue) Exposed exposed. There is no tunneling or undermining noted. There is a medium amount of serous drainage noted. The wound margin is flat and intact. There is medium (34-66%) red granulation within the wound bed. There is a medium (34-66%) amount of necrotic tissue within the wound bed including Adherent Slough. The periwound skin appearance exhibited: Erythema. The periwound skin appearance did not exhibit: Callus, Crepitus, Excoriation, Induration, Rash, Scarring, Dry/Scaly, Maceration, Atrophie Blanche, Cyanosis, Ecchymosis, Hemosiderin Staining, Mottled, Pallor, Rubor. The surrounding wound skin color is noted with erythema which is circumferential. Periwound temperature was noted as No Abnormality. The periwound has tenderness on palpation. Assessment Active Problems ICD-10 Disruption of external operation (surgical) wound, not elsewhere classified, initial encounter Non-pressure chronic ulcer of other part of left lower leg with fat layer exposed Plan Wound Cleansing: May Shower, gently pat wound dry prior to applying new dressing. - Clean wound with Dial antibacterial soap [place], Elgin ([id number]) Anesthetic (add to  Medication List): Wound #1 Left,Proximal,Medial Lower Leg: Topical Lidocaine 4% cream applied to wound bed prior to debridement (In Clinic Only). Skin Barriers/Peri-Wound Care: Wound #1 Left,Proximal,Medial Lower Leg: Other: - Mupiricin ointment Secondary Dressing: Wound #1 Left,Proximal,Medial Lower Leg: Boardered Foam Dressing Dressing Change Frequency: Wound #1 Left,Proximal,Medial Lower Leg: Change dressing every day. Follow-up Appointments: Wound #1 Left,Proximal,Medial Lower Leg: Return Appointment in 1 week. Medications-please add to medication list.: Wound #1 Left,Proximal,Medial Lower Leg: Topical Antibiotic My suggestion currently is that we continue with the Bactroban ointment along with the bandages to cover the area since that seems to be doing so well. Subsequently we will also go ahead and have her complete the Cipro which hopefully will take care of any additional infection that was still slight erythema surrounding the wound although it Artie appears to be doing better which is good news. I will see her back in one week if it's not completely healed at that time chances are it will be within another week following. Please see above for specific wound care orders. We will see patient for re-evaluation in 1 week(s) here in the clinic. If anything worsens or changes patient will contact our office for additional recommendations. Electronic Signature(s) Signed: 06/18/2018 8:18:44 AM By: Worthy Keeler PA-C Entered By: Worthy Keeler on 06/17/2018 09:57:50 [place], [name] ([id number]) -------------------------------------------------------------------------------- ROS/PFSH Details Patient Name: [name] Date of Service: 06/17/2018 9:15 AM Medical Record Number: [id number] Patient Account Number: [id number] Date of Birth/Sex: [birth year] (57 y.o. F) Treating RN: Harold Barban Primary Care Provider: Clayborn Bigness Other Clinician: Referring Provider: Clayborn Bigness Treating Provider/Extender: Melburn Hake, Violia Knopf Weeks in Treatment: 6 Information Obtained From Patient Wound History Do you currently have one or more open woundso Yes How many open wounds do you currently haveo 1 Approximately how long have you had your woundso 5 weeks How have you been treating your wound(s) until nowo open to air Has your wound(s) ever healed and then re-openedo No Have you had any lab work done in the past montho No Have you tested positive for an antibiotic resistant organism (MRSA, VRE)o No Have you tested positive for osteomyelitis (bone infection)o No Have you had any tests for circulation on your legso No Constitutional Symptoms (General Health) Complaints and Symptoms: Negative for: Fever; Chills Eyes Medical History: Negative for: Cataracts; Glaucoma; Optic Neuritis Ear/Nose/Mouth/Throat Medical History: Negative for: Chronic sinus problems/congestion; Middle ear problems Hematologic/Lymphatic Medical History: Negative for: Anemia; Hemophilia; Human Immunodeficiency Virus; Lymphedema; Sickle Cell Disease Respiratory Complaints and Symptoms: No  Complaints or Symptoms Medical History: Negative for: Aspiration; Asthma; Chronic Obstructive Pulmonary Disease (COPD); Pneumothorax; Sleep Apnea; Tuberculosis Cardiovascular Complaints and Symptoms: No Complaints or Symptoms Medical History: Negative for: Angina; Arrhythmia; Congestive Heart Failure; Coronary Artery Disease; Deep Vein Thrombosis; Gravatt, Shajuana L. ([id number]) Hypertension; Hypotension; Myocardial Infarction; Peripheral Arterial Disease; Peripheral Venous Disease; Phlebitis; Vasculitis Past Medical History Notes: laser vein treatments and scleratherapy 2019 Gastrointestinal Medical History: Negative for: Cirrhosis ; Colitis; Crohnos; Hepatitis A; Hepatitis B; Hepatitis C Endocrine Medical History: Negative for: Type I Diabetes; Type II Diabetes Genitourinary Medical  History: Negative for: End Stage Renal Disease Immunological Medical History: Negative for: Lupus Erythematosus; Raynaudos; Scleroderma Integumentary (Skin) Medical History: Negative for: History of Burn; History of pressure wounds Musculoskeletal Medical History: Negative for: Gout; Rheumatoid Arthritis; Osteoarthritis; Osteomyelitis Past Medical History Notes: psoriasis Neurologic Medical History: Negative for: Dementia; Neuropathy; Quadriplegia; Paraplegia; Seizure Disorder Oncologic Medical History: Negative for: Received Chemotherapy; Received Radiation Psychiatric Complaints and Symptoms: No Complaints or Symptoms Medical History: Negative for: Anorexia/bulimia; Confinement Anxiety Immunizations Pneumococcal Vaccine: Received Pneumococcal Vaccination: No Immunization Notes: Kuhl, Siara L. ([id number]) up to date Implantable Devices Family and Social History Cancer: No; Diabetes: No; Heart Disease: Yes - Father,Paternal Grandparents; Hereditary Spherocytosis: No; Hypertension: Yes - Father,Paternal Grandparents; Kidney Disease: No; Lung Disease: No; Seizures: No; Stroke: No; Thyroid Problems: No; Tuberculosis: No; Never smoker; Marital Status - Married; Alcohol Use: Rarely; Drug Use: No History; Caffeine Use: Daily; Financial Concerns: No; Food, Clothing or Shelter Needs: No; Support System Lacking: No; Transportation Concerns: No; Advanced Directives: No; Patient does not want information on Advanced Directives Physician Affirmation I have reviewed and agree with the above information. Electronic Signature(s) Signed: 06/18/2018 8:18:44 AM By: Worthy Keeler PA-C Signed: 06/20/2018 11:43:02 AM By: Harold Barban Entered By: Worthy Keeler on 06/17/2018 09:57:01 Kathan, [name] ([id number]) -------------------------------------------------------------------------------- SuperBill Details Patient Name: [name] Date of Service: 06/17/2018 Medical Record  Number: [id number] Patient Account Number: [id number] Date of Birth/Sex: 1961-01-02 (58 y.o. F) Treating RN: Harold Barban Primary Care Provider: Clayborn Bigness Other Clinician: Referring Provider: Clayborn Bigness Treating Provider/Extender: Melburn Hake, Vann Okerlund Weeks in Treatment: 6 Diagnosis Coding ICD-10 Codes Code Description T81.31XA Disruption of external operation (surgical) wound, not elsewhere classified, initial encounter L97.822 Non-pressure chronic ulcer of other part of left lower leg with fat layer exposed Facility Procedures CPT4 Code: [id number] Description: [facility] VISIT-LEV 3 EST PT Modifier: Quantity: 1 Physician Procedures CPT4: Description Modifier Quantity Code [id number] - WC PHYS LEVEL 4 - EST PT 1 ICD-10 Diagnosis Description T81.31XA Disruption of external operation (surgical) wound, not elsewhere classified, initial encounter 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

## 2018-06-21 NOTE — Progress Notes (Signed)
[name], [name] ([id number]) Visit Report for 06/17/2018 Arrival Information Details Patient Name: [name], [name] Date of Service: 06/17/2018 9:15 AM Medical Record Number: [id number] Patient Account Number: [id number] Date of Birth/Sex: 09/30/1960 (58 y.o. F) Treating RN: Montey Hora Primary Care Sydni Elizarraraz: Clayborn Bigness Other Clinician: Referring Rondell Pardon: Clayborn Bigness Treating Hayzlee Mcsorley/Extender: Melburn Hake, HOYT Weeks in Treatment: 6 Visit Information History Since Last Visit Added or deleted any medications: No Patient Arrived: Ambulatory Any new allergies or adverse reactions: No Arrival Time: 09:25 Had a fall or experienced change in No Accompanied By: self activities of daily living that may affect Transfer Assistance: None risk of falls: Patient Identification Verified: Yes Signs or symptoms of abuse/neglect since last visito No Secondary Verification Process Completed: Yes Hospitalized since last visit: No Implantable device outside of the clinic excluding No cellular tissue based products placed in the center since last visit: Has Dressing in Place as Prescribed: Yes Pain Present Now: No Electronic Signature(s) Signed: 06/17/2018 4:27:23 PM By: Montey Hora Entered By: Montey Hora on 06/17/2018 09:26:18 [name] ([id number]) -------------------------------------------------------------------------------- Clinic Level of Care Assessment Details Patient Name: [name] Date of Service: 06/17/2018 9:15 AM Medical Record Number: [id number] Patient Account Number: [id number] Date of Birth/Sex: 1960-06-15 (58 y.o. F) Treating RN: Harold Barban Primary Care Fin Hupp: Clayborn Bigness Other Clinician: Referring Rakeen Gaillard: Clayborn Bigness Treating Ziyonna Christner/Extender: Melburn Hake, HOYT Weeks in Treatment: 6 Clinic Level of Care Assessment Items TOOL 4 Quantity Score []  - Use when only an EandM is performed on FOLLOW-UP visit 0 ASSESSMENTS - Nursing Assessment /  Reassessment X - Reassessment of Co-morbidities (includes updates in patient status) 1 10 X- 1 5 Reassessment of Adherence to Treatment Plan ASSESSMENTS - Wound and Skin Assessment / Reassessment X - Simple Wound Assessment / Reassessment - one wound 1 5 []  - 0 Complex Wound Assessment / Reassessment - multiple wounds []  - 0 Dermatologic / Skin Assessment (not related to wound area) ASSESSMENTS - Focused Assessment []  - Circumferential Edema Measurements - multi extremities 0 []  - 0 Nutritional Assessment / Counseling / Intervention []  - 0 Lower Extremity Assessment (monofilament, tuning fork, pulses) []  - 0 Peripheral Arterial Disease Assessment (using hand held doppler) ASSESSMENTS - Ostomy and/or Continence Assessment and Care []  - Incontinence Assessment and Management 0 []  - 0 Ostomy Care Assessment and Management (repouching, etc.) PROCESS - Coordination of Care X - Simple Patient / Family Education for ongoing care 1 15 []  - 0 Complex (extensive) Patient / Family Education for ongoing care X- 1 10 Staff obtains Programmer, systems, Records, Test Results / Process Orders []  - 0 Staff telephones HHA, Nursing Homes / Clarify orders / etc []  - 0 Routine Transfer to another Facility (non-emergent condition) []  - 0 Routine Hospital Admission (non-emergent condition) []  - 0 New Admissions / Biomedical engineer / Ordering NPWT, Apligraf, etc. []  - 0 Emergency Hospital Admission (emergent condition) X- 1 10 Simple Discharge Coordination Barringer, Armelia L. ([id number]) []  - 0 Complex (extensive) Discharge Coordination PROCESS - Special Needs []  - Pediatric / Minor Patient Management 0 []  - 0 Isolation Patient Management []  - 0 Hearing / Language / Visual special needs []  - 0 Assessment of Community assistance (transportation, D/C planning, etc.) []  - 0 Additional assistance / Altered mentation []  - 0 Support Surface(s) Assessment (bed, cushion, seat, etc.) INTERVENTIONS -  Wound Cleansing / Measurement X - Simple Wound Cleansing - one wound 1 5 []  - 0 Complex Wound Cleansing - multiple wounds X- 1 5 Wound Imaging (  photographs - any number of wounds) []  - 0 Wound Tracing (instead of photographs) X- 1 5 Simple Wound Measurement - one wound []  - 0 Complex Wound Measurement - multiple wounds INTERVENTIONS - Wound Dressings X - Small Wound Dressing one or multiple wounds 1 10 []  - 0 Medium Wound Dressing one or multiple wounds []  - 0 Large Wound Dressing one or multiple wounds []  - 0 Application of Medications - topical []  - 0 Application of Medications - injection INTERVENTIONS - Miscellaneous []  - External ear exam 0 []  - 0 Specimen Collection (cultures, biopsies, blood, body fluids, etc.) []  - 0 Specimen(s) / Culture(s) sent or taken to Lab for analysis []  - 0 Patient Transfer (multiple staff / Civil Service fast streamer / Similar devices) []  - 0 Simple Staple / Suture removal (25 or less) []  - 0 Complex Staple / Suture removal (26 or more) []  - 0 Hypo / Hyperglycemic Management (close monitor of Blood Glucose) []  - 0 Ankle / Brachial Index (ABI) - do not check if billed separately X- 1 5 Vital Signs Velarde, Cheynne L. ([id number]) Has the patient been seen at the hospital within the last three years: Yes Total Score: 85 Level Of Care: New/Established - Level 3 Electronic Signature(s) Signed: 06/20/2018 11:43:02 AM By: Harold Barban Entered By: Harold Barban on 06/17/2018 09:52:57 [name] ([id number]) -------------------------------------------------------------------------------- Encounter Discharge Information Details Patient Name: [name] Date of Service: 06/17/2018 9:15 AM Medical Record Number: [id number] Patient Account Number: [id number] Date of Birth/Sex: [birth year] (58 y.o. F) Treating RN: [name] Primary Care Audel Coakley: Clayborn Bigness Other Clinician: Referring Windsor Zirkelbach: Clayborn Bigness Treating Aaron Bostwick/Extender: Melburn Hake, HOYT Weeks in Treatment: 6 Encounter Discharge Information Items Discharge Condition: Stable Ambulatory Status: Ambulatory Discharge Destination: Home Transportation: Private Auto Accompanied By: self Schedule Follow-up Appointment: Yes Clinical Summary of Care: Electronic Signature(s) Signed: 06/17/2018 5:20:52 PM By: Gretta Cool, BSN, RN, CWS, Kim RN, BSN Entered By: Gretta Cool, BSN, RN, CWS, Kim on 06/17/2018 09:59:07 Markus, Mickel [name] ([id number]) -------------------------------------------------------------------------------- Lower Extremity Assessment Details Patient Name: [name] Date of Service: 06/17/2018 9:15 AM Medical Record Number: [id number] Patient Account Number: [id number] Date of Birth/Sex: 1960/12/01 (57 y.o. F) Treating RN: Montey Hora Primary Care Jahmari Esbenshade: Clayborn Bigness Other Clinician: Referring Elia Keenum: Clayborn Bigness Treating Elnita Surprenant/Extender: Melburn Hake, HOYT Weeks in Treatment: 6 Edema Assessment Assessed: [Left: No] [Right: No] Edema: [Left: N] [Right: o] Vascular Assessment Pulses: Dorsalis Pedis Palpable: [Left:Yes] Posterior Tibial Extremity colors, hair growth, and conditions: Extremity Color: [Left:Normal] Hair Growth on Extremity: [Left:Yes] Temperature of Extremity: [Left:Warm] Capillary Refill: [Left:< 3 seconds] Toe Nail Assessment Left: Right: Thick: No Discolored: No Deformed: No Improper Length and Hygiene: No Electronic Signature(s) Signed: 06/17/2018 4:27:23 PM By: Montey Hora Entered By: Montey Hora on 06/17/2018 09:32:45 Sear, Mickel [name] ([id number]) -------------------------------------------------------------------------------- Multi Wound Chart Details Patient Name: [name] Date of Service: 06/17/2018 9:15 AM Medical Record Number: [id number] Patient Account Number: [id number] Date of Birth/Sex: 1960/05/16 (58 y.o. F) Treating RN: Harold Barban Primary Care Karlissa Aron: Clayborn Bigness Other  Clinician: Referring Anthon Harpole: Clayborn Bigness Treating Chessica Audia/Extender: Melburn Hake, HOYT Weeks in Treatment: 6 Vital Signs Height(in): 64 Pulse(bpm): 77 Weight(lbs): 178 Blood Pressure(mmHg): 123/84 Body Mass Index(BMI): 31 Temperature(F): 98.2 Respiratory Rate 16 (breaths/min): Photos: [N/A:N/A] Wound Location: Left Lower Leg - Medial, N/A N/A Proximal Wounding Event: Surgical Injury N/A N/A Primary Etiology: Open Surgical Wound N/A N/A Date Acquired: 04/02/2018 N/A N/A Weeks of Treatment: 6 N/A N/A Wound Status: Open N/A N/A Measurements L x W x  D 0.4x0.6x0.2 N/A N/A (cm) Area (cm) : 0.188 N/A N/A Volume (cm) : 0.038 N/A N/A % Reduction in Area: 75.10% N/A N/A % Reduction in Volume: 95.00% N/A N/A Classification: Full Thickness Without N/A N/A Exposed Support Structures Exudate Amount: Medium N/A N/A Exudate Type: Serous N/A N/A Exudate Color: amber N/A N/A Wound Margin: Flat and Intact N/A N/A Granulation Amount: Medium (34-66%) N/A N/A Granulation Quality: Red N/A N/A Necrotic Amount: Medium (34-66%) N/A N/A Exposed Structures: Fat Layer (Subcutaneous N/A N/A Tissue) Exposed: Yes Fascia: No Tendon: No Muscle: No Joint: No Bone: No Lagasse, Aliciana L. ([id number]) Epithelialization: Small (1-33%) N/A N/A Periwound Skin Texture: Excoriation: No N/A N/A Induration: No Callus: No Crepitus: No Rash: No Scarring: No Periwound Skin Moisture: Maceration: No N/A N/A Dry/Scaly: No Periwound Skin Color: Erythema: Yes N/A N/A Atrophie Blanche: No Cyanosis: No Ecchymosis: No Hemosiderin Staining: No Mottled: No Pallor: No Rubor: No Erythema Location: Circumferential N/A N/A Temperature: No Abnormality N/A N/A Tenderness on Palpation: Yes N/A N/A Wound Preparation: Ulcer Cleansing: N/A N/A Rinsed/Irrigated with Saline Topical Anesthetic Applied: Other: lidocaine 4% Treatment Notes Electronic Signature(s) Signed: 06/20/2018 11:43:02 AM By: Harold Barban Entered By: Harold Barban on 06/17/2018 09:50:49 Jordan, Mickel [name] ([id number]) -------------------------------------------------------------------------------- [place] Details Patient Name: [name] Date of Service: 06/17/2018 9:15 AM Medical Record Number: [id number] Patient Account Number: [id number] Date of Birth/Sex: 11-02-1960 (58 y.o. F) Treating RN: Harold Barban Primary Care Sanvika Cuttino: Clayborn Bigness Other Clinician: Referring Antrone Walla: Clayborn Bigness Treating Keyshla Tunison/Extender: Melburn Hake, HOYT Weeks in Treatment: 6 Active Inactive Necrotic Tissue Nursing Diagnoses: Impaired tissue integrity related to necrotic/devitalized tissue Goals: Necrotic/devitalized tissue will be minimized in the wound bed Date Initiated: 06/10/2018 Target Resolution Date: 06/21/2018 Goal Status: Active Interventions: Assess patient pain level pre-, during and post procedure and prior to discharge Treatment Activities: Apply topical anesthetic as ordered : 06/10/2018 Notes: Wound/Skin Impairment Nursing Diagnoses: Knowledge deficit related to ulceration/compromised skin integrity Goals: Ulcer/skin breakdown will have a volume reduction of 30% by week 4 Date Initiated: 05/06/2018 Target Resolution Date: 06/06/2018 Goal Status: Active Interventions: Assess patient/caregiver ability to obtain necessary supplies Assess patient/caregiver ability to perform ulcer/skin care regimen upon admission and as needed Assess ulceration(s) every visit Notes: Electronic Signature(s) Signed: 06/20/2018 11:43:02 AM By: Harold Barban Entered By: Harold Barban on 06/17/2018 09:50:42 Fesperman, Mickel [name] ([id number]) -------------------------------------------------------------------------------- Pain Assessment Details Patient Name: [name] Date of Service: 06/17/2018 9:15 AM Medical Record Number: [id number] Patient Account Number: [id number] Date of Birth/Sex:  [birth year] (58 y.o. F) Treating RN: Montey Hora Primary Care Ashea Winiarski: Clayborn Bigness Other Clinician: Referring Louanna Vanliew: Clayborn Bigness Treating Treniece Holsclaw/Extender: Melburn Hake, HOYT Weeks in Treatment: 6 Active Problems Location of Pain Severity and Description of Pain Patient Has Paino No Site Locations Pain Management and Medication Current Pain Management: Electronic Signature(s) Signed: 06/17/2018 4:27:23 PM By: Montey Hora Entered By: Montey Hora on 06/17/2018 09:26:24 Dieterich, Mickel [name] ([id number]) -------------------------------------------------------------------------------- Patient/Caregiver Education Details Patient Name: [name] Date of Service: 06/17/2018 9:15 AM Medical Record Number: [id number] Patient Account Number: [id number] Date of Birth/Gender: [birth year] (58 y.o. F) Treating RN: Harold Barban Primary Care Physician: Clayborn Bigness Other Clinician: Referring Physician: Clayborn Bigness Treating Physician/Extender: Sharalyn Ink in Treatment: 6 Education Assessment Education Provided To: Patient Education Topics Provided Wound/Skin Impairment: Handouts: Caring for Your Ulcer Methods: Demonstration, Explain/Verbal Responses: State content correctly Electronic Signature(s) Signed: 06/20/2018 11:43:02 AM By: Harold Barban Entered By: Harold Barban on 06/17/2018 09:51:15 Kinyon, Oceane L. ([id number]) -------------------------------------------------------------------------------- Wound  Assessment Details Patient Name: [name], [name] Date of Service: 06/17/2018 9:15 AM Medical Record Number: [id number] Patient Account Number: [id number] Date of Birth/Sex: 1960/10/23 (58 y.o. F) Treating RN: Montey Hora Primary Care Analisia Kingsford: Clayborn Bigness Other Clinician: Referring Camren Lipsett: Clayborn Bigness Treating Dell Hurtubise/Extender: Melburn Hake, HOYT Weeks in Treatment: 6 Wound Status Wound Number: 1 Primary Etiology: Open Surgical Wound Wound Location:  Left Lower Leg - Medial, Proximal Wound Status: Open Wounding Event: Surgical Injury Date Acquired: 04/02/2018 Weeks Of Treatment: 6 Clustered Wound: No Photos Photo Uploaded By: Montey Hora on 06/17/2018 09:36:01 Wound Measurements Length: (cm) 0.4 Width: (cm) 0.6 Depth: (cm) 0.2 Area: (cm) 0.188 Volume: (cm) 0.038 % Reduction in Area: 75.1% % Reduction in Volume: 95% Epithelialization: Small (1-33%) Tunneling: No Undermining: No Wound Description Full Thickness Without Exposed Support Foul O Classification: Structures Slough Wound Margin: Flat and Intact Exudate Medium Amount: Exudate Type: Serous Exudate Color: amber dor After Cleansing: No /Fibrino Yes Wound Bed Granulation Amount: Medium (34-66%) Exposed Structure Granulation Quality: Red Fascia Exposed: No Necrotic Amount: Medium (34-66%) Fat Layer (Subcutaneous Tissue) Exposed: Yes Necrotic Quality: Adherent Slough Tendon Exposed: No Muscle Exposed: No Joint Exposed: No Bone Exposed: No Capek, Yaris L. ([id number]) Periwound Skin Texture Texture Color No Abnormalities Noted: No No Abnormalities Noted: No Callus: No Atrophie Blanche: No Crepitus: No Cyanosis: No Excoriation: No Ecchymosis: No Induration: No Erythema: Yes Rash: No Erythema Location: Circumferential Scarring: No Hemosiderin Staining: No Mottled: No Moisture Pallor: No No Abnormalities Noted: No Rubor: No Dry / Scaly: No Maceration: No Temperature / Pain Temperature: No Abnormality Tenderness on Palpation: Yes Wound Preparation Ulcer Cleansing: Rinsed/Irrigated with Saline Topical Anesthetic Applied: Other: lidocaine 4%, Treatment Notes Wound #1 (Left, Proximal, Medial Lower Leg) Notes Mupiricin secure with Coverlet. Electronic Signature(s) Signed: 06/17/2018 4:27:23 PM By: Montey Hora Entered By: Montey Hora on 06/17/2018 09:32:13 Leicht, Mickel [name]  ([id number]) -------------------------------------------------------------------------------- Vitals Details Patient Name: [name] Date of Service: 06/17/2018 9:15 AM Medical Record Number: [id number] Patient Account Number: [id number] Date of Birth/Sex: 02/22/1961 (58 y.o. F) Treating RN: Montey Hora Primary Care Jorel Gravlin: Clayborn Bigness Other Clinician: Referring Ebrima Ranta: Clayborn Bigness Treating Iveth Heidemann/Extender: Melburn Hake, HOYT Weeks in Treatment: 6 Vital Signs Time Taken: 09:26 Temperature (F): 98.2 Height (in): 64 Pulse (bpm): 77 Weight (lbs): 178 Respiratory Rate (breaths/min): 16 Body Mass Index (BMI): 30.6 Blood Pressure (mmHg): 123/84 Reference Range: 80 - 120 mg / dl Airway Electronic Signature(s) Signed: 06/17/2018 4:27:23 PM By: Montey Hora Entered By: Montey Hora on 06/17/2018 12:24:49

## 2018-06-24 ENCOUNTER — Encounter: Payer: Managed Care, Other (non HMO) | Admitting: Physician Assistant

## 2018-06-24 DIAGNOSIS — E11622 Type 2 diabetes mellitus with other skin ulcer: Secondary | ICD-10-CM | POA: Diagnosis not present

## 2018-06-26 NOTE — Progress Notes (Signed)
Stacey [name] [name], Stacey [name] [name] ([id number]) Visit Report for 06/24/2018 Chief Complaint Document Details Patient Name: Stacey [name], [name] Date of Service: 06/24/2018 10:30 AM Medical Record Number: [id number] Patient Account Number: [id number] Date of Birth/Sex: 08-28-1960 (58 y.o. F) Treating RN: Stacey [name] [name] Primary Care Provider: Clayborn [name] Other Clinician: Referring Provider: Clayborn [name] Treating Provider/Extender: Stacey [name] [name], Stacey [name] [name] in Treatment: 7 Information Obtained from: Patient Chief Complaint Left lower leg surgical ulcer Electronic Signature(s) Signed: 06/25/2018 5:08:21 PM By: Stacey Keeler PA-C Entered By: Stacey [name] [name] on 06/24/2018 11:06:01 Code, Stacey [name] [name] ([id number]) -------------------------------------------------------------------------------- HPI Details Patient Name: Stacey [name] [name] Date of Service: 06/24/2018 10:30 AM Medical Record Number: [id number] Patient Account Number: [id number] Date of Birth/Sex: [birth year] (57 y.o. F) Treating RN: Stacey [name] [name] Primary Care Provider: Clayborn [name] Other Clinician: Referring Provider: Clayborn [name] Treating Provider/Extender: Stacey [name] [name], Stacey [name] [name] [name] in Treatment: 7 History of Present Illness HPI Description: 05/06/18 on evaluation today patient presents for initial inspection in our clinic concerning an issue that she has been having on her lower extremity as result of a initially biopsy and then subsequently excision of a precancerous lesion which was undertaken by dermatology on 04/02/18. I do not have the pathology report available today for my valuation unfortunately. Nonetheless we are gonna work on getting this from Dr. Elveria Rising office. The patient states that since this was removed and was quite deep sutures were attempted internally to help pull things together and then the area was pulled together utilizing Steri-Strips. Unfortunately it appears that the suture closure but Steri-Strips did not taken the patient is currently  having issues with a dehisced wound where there is significant slough buildup internally. Nonetheless I do believe that this is something that may benefit from sharp debridement and good wound care at this time. Fortunately there is no sign of infection currently. No fevers, chills, nausea, or vomiting noted at this time. The patient is here is a referral from her primary care provider. 05/20/18 on evaluation today patient actually appears to be doing rather well in regard to her lower extremity ulcer. The area of skin that was connecting the small opening to the larger main opening is actually for me now as I expected it likely would. This likely means that we're gonna need to go ahead and see about trimming this area off which I think is the most perfect and help this area to heal appropriately. The patient is in agreement with this plan today. Other than that there's no sign of infection she seems to be doing very well. 05/27/18 on evaluation today patient actually appears to be doing very well in regard to her leg ulcer. The good news is we did get approval for the Snap Vac. With that being said she seems to be doing so well I'm not even certain that this is going to become necessary. She's not quite sure she would want to proceed with this anyway just due to the fact that it would be somewhat more cumbersome than just utilizing the dressing itself. Nonetheless that is definitely an option we discussed that further during the visit. She seems to be healing quite nicely. 06/03/18 on evaluation today patient appears to be doing well in regard to her lower extremity ulcer. She has been tolerating the dressing changes without complication. Fortunately there is no sign of infection she did have some Slough noted on the surface of the wound today. 06/10/18 on evaluation today patient's wound actually appears to be showing some signs of infection unfortunately.  She's also having a little bit of increased  pain which has me somewhat concerned as well. No fevers, chills, nausea, or vomiting noted at this time. 06/17/18 on evaluation today patient actually appears to be doing very well in regard to her lower Trinity ulcer. In fact this seems to be showing signs of excellent improvement when compared to last week she's not having the pain she was having last week and overall this is excellent news. I'm very pleased with how things have progressed. With that being said I do think that we may just want to keep with the and about equipment at this point since she has done so well I did add support was well attended last week for her based on the culture results. It did show that she had a Pseudomonas infection as well. 06/24/18 on evaluation today patient appears to be doing excellent in regard to her left lower extremity ulcer. She has been tolerating the dressing changes without complication. Fortunately she seems to have made excellent progress even since last week when I saw her. Overall I'm very happy with the way things have been going. Electronic Signature(s) Signed: 06/25/2018 5:08:21 PM By: Stacey Keeler PA-C Entered By: Stacey [name] [name] on 06/24/2018 13:22:28 Stacey [name] [name], Stacey [name] [name] ([id number]) Stacey [name] [name], Stacey [name] [name] ([id number]) -------------------------------------------------------------------------------- Physical Exam Details Patient Name: Stacey [name] [name] Date of Service: 06/24/2018 10:30 AM Medical Record Number: [id number] Patient Account Number: [id number] Date of Birth/Sex: 1960/10/18 (57 y.o. F) Treating RN: Stacey [name] [name] Primary Care Provider: Clayborn [name] Other Clinician: Referring Provider: Clayborn [name] Treating Provider/Extender: Stacey [name] [name], Sonal Dorwart [name] in Treatment: 7 Constitutional Well-nourished and well-hydrated in no acute distress. Respiratory normal breathing without difficulty. clear to auscultation bilaterally. Cardiovascular regular rate and rhythm with normal S1,  S2. Psychiatric this patient is able to make decisions and demonstrates good insight into disease process. Alert and Oriented x 3. pleasant and cooperative. Notes Patient's wound bed today did not require sharp debridement and appears to be almost completely resolved which is excellent news. Electronic Signature(s) Signed: 06/25/2018 5:08:21 PM By: Stacey Keeler PA-C Entered By: Stacey [name] [name] on 06/24/2018 13:23:00 Stacey [name] [name], Stacey [name] [name] ([id number]) -------------------------------------------------------------------------------- Physician Orders Details Patient Name: Stacey [name] [name] Date of Service: 06/24/2018 10:30 AM Medical Record Number: [id number] Patient Account Number: [id number] Date of Birth/Sex: [birth year] (57 y.o. F) Treating RN: Montey Hora Primary Care Provider: Clayborn [name] Other Clinician: Referring Provider: Clayborn [name] Treating Provider/Extender: Stacey [name] [name], Righteous Claiborne [name] in Treatment: 7 Verbal / Phone Orders: No Diagnosis Coding ICD-10 Coding Code Description T81.31XA Disruption of external operation (surgical) wound, not elsewhere classified, initial encounter [phone] Non-pressure chronic ulcer of other part of left lower leg with fat layer exposed Wound Cleansing o May Shower, gently pat wound dry prior to applying new dressing. - Clean wound with Dial antibacterial soap Anesthetic (add to Medication List) Wound #1 Left,Proximal,Medial Lower Leg o Topical Lidocaine 4% cream applied to wound bed prior to debridement (In Clinic Only). Skin Barriers/Peri-Wound Care Wound #1 Left,Proximal,Medial Lower Leg o Other: - Mupiricin ointment Secondary Dressing Wound #1 Left,Proximal,Medial Lower Leg o Boardered Foam Dressing Dressing Change Frequency Wound #1 Left,Proximal,Medial Lower Leg o Change dressing every day. Follow-up Appointments Wound #1 Left,Proximal,Medial Lower Leg o Return Appointment in 2 [name]. Medications-please add to medication  list. Wound #1 Left,Proximal,Medial Lower Leg o Topical Antibiotic Electronic Signature(s) Signed: 06/24/2018 5:02:11 PM By: Montey Hora Signed: 06/25/2018 5:08:21 PM By: Stacey Keeler PA-C Entered By: Montey Hora on 06/24/2018 11:12:57 Stacey [name] [name], Stacey [name] L. (  [id number]) -------------------------------------------------------------------------------- Problem List Details Patient Name: PHILANA, YOUNIS Date of Service: 06/24/2018 10:30 AM Medical Record Number: [id number] Patient Account Number: [id number] Date of Birth/Sex: [birth year] (58 y.o. F) Treating RN: Stacey [name] [name] Primary Care Provider: Clayborn [name] Other Clinician: Referring Provider: Clayborn [name] Treating Provider/Extender: Stacey [name] [name], Tiffiny [name] in Treatment: 7 Active Problems ICD-10 Evaluated Encounter Code Description Active Date Today Diagnosis T81.31XA Disruption of external operation (surgical) wound, not 05/06/2018 No Yes elsewhere classified, initial encounter L97.822 Non-pressure chronic ulcer of other part of left lower leg with 05/06/2018 No Yes fat layer exposed Inactive Problems Resolved Problems Electronic Signature(s) Signed: 06/25/2018 5:08:21 PM By: Stacey Keeler PA-C Entered By: Stacey [name] [name] on 06/24/2018 11:05:54 Jim, Stacey [name] [name] ([id number]) -------------------------------------------------------------------------------- Progress Note Details Patient Name: Stacey [name] [name] Date of Service: 06/24/2018 10:30 AM Medical Record Number: [id number] Patient Account Number: [id number] Date of Birth/Sex: 1960-08-31 (57 y.o. F) Treating RN: Stacey [name] [name] Primary Care Provider: Clayborn [name] Other Clinician: Referring Provider: Clayborn [name] Treating Provider/Extender: Stacey [name] [name], Sache Sane [name] in Treatment: 7 Subjective Chief Complaint Information obtained from Patient Left lower leg surgical ulcer History of Present Illness (HPI) 05/06/18 on evaluation today patient presents for initial inspection in our  clinic concerning an issue that she has been having on her lower extremity as result of a initially biopsy and then subsequently excision of a precancerous lesion which was undertaken by dermatology on 04/02/18. I do not have the pathology report available today for my valuation unfortunately. Nonetheless we are gonna work on getting this from Dr. Elveria Rising office. The patient states that since this was removed and was quite deep sutures were attempted internally to help pull things together and then the area was pulled together utilizing Steri-Strips. Unfortunately it appears that the suture closure but Steri-Strips did not taken the patient is currently having issues with a dehisced wound where there is significant slough buildup internally. Nonetheless I do believe that this is something that may benefit from sharp debridement and good wound care at this time. Fortunately there is no sign of infection currently. No fevers, chills, nausea, or vomiting noted at this time. The patient is here is a referral from her primary care provider. 05/20/18 on evaluation today patient actually appears to be doing rather well in regard to her lower extremity ulcer. The area of skin that was connecting the small opening to the larger main opening is actually for me now as I expected it likely would. This likely means that we're gonna need to go ahead and see about trimming this area off which I think is the most perfect and help this area to heal appropriately. The patient is in agreement with this plan today. Other than that there's no sign of infection she seems to be doing very well. 05/27/18 on evaluation today patient actually appears to be doing very well in regard to her leg ulcer. The good news is we did get approval for the Snap Vac. With that being said she seems to be doing so well I'm not even certain that this is going to become necessary. She's not quite sure she would want to proceed with this anyway  just due to the fact that it would be somewhat more cumbersome than just utilizing the dressing itself. Nonetheless that is definitely an option we discussed that further during the visit. She seems to be healing quite nicely. 06/03/18 on evaluation today patient appears to be doing well in regard to her lower extremity ulcer. She has  been tolerating the dressing changes without complication. Fortunately there is no sign of infection she did have some Slough noted on the surface of the wound today. 06/10/18 on evaluation today patient's wound actually appears to be showing some signs of infection unfortunately. She's also having a little bit of increased pain which has me somewhat concerned as well. No fevers, chills, nausea, or vomiting noted at this time. 06/17/18 on evaluation today patient actually appears to be doing very well in regard to her lower Trinity ulcer. In fact this seems to be showing signs of excellent improvement when compared to last week she's not having the pain she was having last week and overall this is excellent news. I'm very pleased with how things have progressed. With that being said I do think that we may just want to keep with the and about equipment at this point since she has done so well I did add support was well attended last week for her based on the culture results. It did show that she had a Pseudomonas infection as well. 06/24/18 on evaluation today patient appears to be doing excellent in regard to her left lower extremity ulcer. She has been tolerating the dressing changes without complication. Fortunately she seems to have made excellent progress even since last week when I saw her. Overall I'm very happy with the way things have been going. Stacey [name] [name], Stacey [name] L. ([id number]) Patient History Information obtained from Patient. Family History Heart Disease - Father,Paternal Grandparents, Hypertension - Father,Paternal Grandparents, No family history of Cancer,  Diabetes, Hereditary Spherocytosis, Kidney Disease, Lung Disease, Seizures, Stroke, Thyroid Problems, Tuberculosis. Social History Never smoker, Marital Status - Married, Alcohol Use - Rarely, Drug Use - No History, Caffeine Use - Daily. Medical History Eyes Denies history of Cataracts, Glaucoma, Optic Neuritis Ear/Nose/Mouth/Throat Denies history of Chronic sinus problems/congestion, Middle ear problems Hematologic/Lymphatic Denies history of Anemia, Hemophilia, Human Immunodeficiency Virus, Lymphedema, Sickle Cell Disease Respiratory Denies history of Aspiration, Asthma, Chronic Obstructive Pulmonary Disease (COPD), Pneumothorax, Sleep Apnea, Tuberculosis Cardiovascular Denies history of Angina, Arrhythmia, Congestive Heart Failure, Coronary Artery Disease, Deep Vein Thrombosis, Hypertension, Hypotension, Myocardial Infarction, Peripheral Arterial Disease, Peripheral Venous Disease, Phlebitis, Vasculitis Gastrointestinal Denies history of Cirrhosis , Colitis, Crohn s, Hepatitis A, Hepatitis B, Hepatitis C Endocrine Denies history of Type I Diabetes, Type II Diabetes Genitourinary Denies history of End Stage Renal Disease Immunological Denies history of Lupus Erythematosus, Raynaud s, Scleroderma Integumentary (Skin) Denies history of History of Burn, History of pressure wounds Musculoskeletal Denies history of Gout, Rheumatoid Arthritis, Osteoarthritis, Osteomyelitis Neurologic Denies history of Dementia, Neuropathy, Quadriplegia, Paraplegia, Seizure Disorder Oncologic Denies history of Received Chemotherapy, Received Radiation Psychiatric Denies history of Anorexia/bulimia, Confinement Anxiety Medical And Surgical History Notes Cardiovascular laser vein treatments and scleratherapy 2019 Musculoskeletal psoriasis Review of Systems (ROS) Constitutional Symptoms (General Health) Denies complaints or symptoms of Fever, Chills. Respiratory The patient has no complaints or  symptoms. Cardiovascular The patient has no complaints or symptoms. Psychiatric The patient has no complaints or symptoms. Stacey [name] [name], Stacey [name] L. ([id number]) Objective Constitutional Well-nourished and well-hydrated in no acute distress. Vitals Time Taken: 10:40 AM, Height: 64 in, Weight: 178 lbs, BMI: 30.6, Temperature: 98.1 F, Pulse: 78 bpm, Respiratory Rate: 16 breaths/min, Blood Pressure: 139/78 mmHg. Respiratory normal breathing without difficulty. clear to auscultation bilaterally. Cardiovascular regular rate and rhythm with normal S1, S2. Psychiatric this patient is able to make decisions and demonstrates good insight into disease process. Alert and Oriented x 3. pleasant and cooperative. General Notes: Patient's wound bed  today did not require sharp debridement and appears to be almost completely resolved which is excellent news. Integumentary (Hair, Skin) Wound #1 status is Open. Original cause of wound was Surgical Injury. The wound is located on the Left,Proximal,Medial Lower Leg. The wound measures 0.3cm length x 0.3cm width x 0.2cm depth; 0.071cm^2 area and 0.014cm^3 volume. There is Fat Layer (Subcutaneous Tissue) Exposed exposed. There is no tunneling or undermining noted. There is a small amount of serous drainage noted. The wound margin is flat and intact. There is no granulation within the wound bed. There is no necrotic tissue within the wound bed. The periwound skin appearance exhibited: Erythema. The periwound skin appearance did not exhibit: Callus, Crepitus, Excoriation, Induration, Rash, Scarring, Dry/Scaly, Maceration, Atrophie Blanche, Cyanosis, Ecchymosis, Hemosiderin Staining, Mottled, Pallor, Rubor. The surrounding wound skin color is noted with erythema which is circumferential. Periwound temperature was noted as No Abnormality. The periwound has tenderness on palpation. Assessment Active Problems ICD-10 Disruption of external operation (surgical) wound,  not elsewhere classified, initial encounter Non-pressure chronic ulcer of other part of left lower leg with fat layer exposed Plan Stacey [name] [name], Stacey [name] [name] ([id number]) Wound Cleansing: May Shower, gently pat wound dry prior to applying new dressing. - Clean wound with Dial antibacterial soap Anesthetic (add to Medication List): Wound #1 Left,Proximal,Medial Lower Leg: Topical Lidocaine 4% cream applied to wound bed prior to debridement (In Clinic Only). Skin Barriers/Peri-Wound Care: Wound #1 Left,Proximal,Medial Lower Leg: Other: - Mupiricin ointment Secondary Dressing: Wound #1 Left,Proximal,Medial Lower Leg: Boardered Foam Dressing Dressing Change Frequency: Wound #1 Left,Proximal,Medial Lower Leg: Change dressing every day. Follow-up Appointments: Wound #1 Left,Proximal,Medial Lower Leg: Return Appointment in 2 [name]. Medications-please add to medication list.: Wound #1 Left,Proximal,Medial Lower Leg: Topical Antibiotic I believe likely we will see her wound completely close with in the next two [name]. For that reason I'm gonna go ahead and set up a two week follow-up appointment will reevaluate and see where we stand at that point. If anything changes in the meantime she will contact the office and let me know. Please see above for specific wound care orders. We will see patient for re-evaluation in 2 week(s) here in the clinic. If anything worsens or changes patient will contact our office for additional recommendations. Electronic Signature(s) Signed: 06/25/2018 5:08:21 PM By: Stacey Keeler PA-C Entered By: Stacey [name] [name] on 06/24/2018 13:23:16 Stacey [name] [name], Stacey [name] [name] ([id number]) -------------------------------------------------------------------------------- ROS/PFSH Details Patient Name: Stacey [name] [name] Date of Service: 06/24/2018 10:30 AM Medical Record Number: [id number] Patient Account Number: [id number] Date of Birth/Sex: 11/19/1960 (57 y.o. F) Treating RN: Stacey [name] [name] Primary Care Provider: Clayborn [name] Other Clinician: Referring Provider: Clayborn [name] Treating Provider/Extender: Stacey [name] [name], Lionell Matuszak [name] in Treatment: 7 Information Obtained From Patient Wound History Do you currently have one or more open woundso Yes How many open wounds do you currently haveo 1 Approximately how long have you had your woundso 5 [name] How have you been treating your wound(s) until nowo open to air Has your wound(s) ever healed and then re-openedo No Have you had any lab work done in the past montho No Have you tested positive for an antibiotic resistant organism (MRSA, VRE)o No Have you tested positive for osteomyelitis (bone infection)o No Have you had any tests for circulation on your legso No Constitutional Symptoms (General Health) Complaints and Symptoms: Negative for: Fever; Chills Eyes Medical History: Negative for: Cataracts; Glaucoma; Optic Neuritis Ear/Nose/Mouth/Throat Medical History: Negative for: Chronic sinus problems/congestion; Middle ear problems Hematologic/Lymphatic Medical History: Negative for:  Anemia; Hemophilia; Human Immunodeficiency Virus; Lymphedema; Sickle Cell Disease Respiratory Complaints and Symptoms: No Complaints or Symptoms Medical History: Negative for: Aspiration; Asthma; Chronic Obstructive Pulmonary Disease (COPD); Pneumothorax; Sleep Apnea; Tuberculosis Cardiovascular Complaints and Symptoms: No Complaints or Symptoms Medical History: Negative for: Angina; Arrhythmia; Congestive Heart Failure; Coronary Artery Disease; Deep Vein Thrombosis; Stacey [name] [name], Stacey [name] L. ([id number]) Hypertension; Hypotension; Myocardial Infarction; Peripheral Arterial Disease; Peripheral Venous Disease; Phlebitis; Vasculitis Past Medical History Notes: laser vein treatments and scleratherapy 2019 Gastrointestinal Medical History: Negative for: Cirrhosis ; Colitis; Crohnos; Hepatitis A; Hepatitis B; Hepatitis C Endocrine Medical  History: Negative for: Type I Diabetes; Type II Diabetes Genitourinary Medical History: Negative for: End Stage Renal Disease Immunological Medical History: Negative for: Lupus Erythematosus; Raynaudos; Scleroderma Integumentary (Skin) Medical History: Negative for: History of Burn; History of pressure wounds Musculoskeletal Medical History: Negative for: Gout; Rheumatoid Arthritis; Osteoarthritis; Osteomyelitis Past Medical History Notes: psoriasis Neurologic Medical History: Negative for: Dementia; Neuropathy; Quadriplegia; Paraplegia; Seizure Disorder Oncologic Medical History: Negative for: Received Chemotherapy; Received Radiation Psychiatric Complaints and Symptoms: No Complaints or Symptoms Medical History: Negative for: Anorexia/bulimia; Confinement Anxiety Immunizations Pneumococcal Vaccine: Received Pneumococcal Vaccination: No Immunization Notes: Stacey [name] [name], Stacey [name] L. ([id number]) up to date Implantable Devices Family and Social History Cancer: No; Diabetes: No; Heart Disease: Yes - Father,Paternal Grandparents; Hereditary Spherocytosis: No; Hypertension: Yes - Father,Paternal Grandparents; Kidney Disease: No; Lung Disease: No; Seizures: No; Stroke: No; Thyroid Problems: No; Tuberculosis: No; Never smoker; Marital Status - Married; Alcohol Use: Rarely; Drug Use: No History; Caffeine Use: Daily; Financial Concerns: No; Food, Clothing or Shelter Needs: No; Support System Lacking: No; Transportation Concerns: No; Advanced Directives: No; Patient does not want information on Advanced Directives Physician Affirmation I have reviewed and agree with the above information. Electronic Signature(s) Signed: 06/25/2018 11:53:49 AM By: Gretta Cool, BSN, RN, CWS, Kim RN, BSN Signed: 06/25/2018 5:08:21 PM By: Stacey Keeler PA-C Entered By: Stacey [name] [name] on 06/24/2018 13:22:46 Aamodt, Stacey [name] [name]  ([id number]) -------------------------------------------------------------------------------- SuperBill Details Patient Name: Stacey [name] [name] Date of Service: 06/24/2018 Medical Record Number: [id number] Patient Account Number: [id number] Date of Birth/Sex: [birth year] (58 y.o. F) Treating RN: Stacey [name] [name] Primary Care Provider: Clayborn [name] Other Clinician: Referring Provider: Clayborn [name] Treating Provider/Extender: Stacey [name] [name], Zissy Hamlett [name] in Treatment: 7 Diagnosis Coding ICD-10 Codes Code Description T81.31XA Disruption of external operation (surgical) wound, not elsewhere classified, initial encounter L97.822 Non-pressure chronic ulcer of other part of left lower leg with fat layer exposed Facility Procedures CPT4 Code: 59935701 Description: 99213 - WOUND CARE VISIT-LEV 3 EST PT Modifier: Quantity: 1 Physician Procedures CPT4: Description Modifier Quantity Code [id number] 99214 - WC PHYS LEVEL 4 - EST PT 1 ICD-10 Diagnosis Description T81.31XA Disruption of external operation (surgical) wound, not elsewhere classified, initial encounter E09.233 Non-pressure chronic ulcer of  other part of left lower leg with fat layer exposed Electronic Signature(s) Signed: 06/25/2018 5:08:21 PM By: Stacey Keeler PA-C Entered By: Stacey [name] [name] on 06/24/2018 13:23:26

## 2018-06-26 NOTE — Progress Notes (Signed)
[name] ([id number]) Visit Report for 06/24/2018 Arrival Information Details Patient Name: [name], [name] Date of Service: 06/24/2018 10:30 AM Medical Record Number: [id number] Patient Account Number: [id number] Date of Birth/Sex: 1960-05-19 (58 y.o. F) Treating RN: Secundino Ginger Primary Care Jozeph Persing: Clayborn Bigness Other Clinician: Referring Ryenn Howeth: Clayborn Bigness Treating Jermain Curt/Extender: Melburn Hake, HOYT Weeks in Treatment: 7 Visit Information History Since Last Visit Added or deleted any medications: No Patient Arrived: Ambulatory Any new allergies or adverse reactions: No Arrival Time: 10:39 Had a fall or experienced change in No Accompanied By: self activities of daily living that may affect Transfer Assistance: None risk of falls: Patient Identification Verified: Yes Signs or symptoms of abuse/neglect since last visito No Secondary Verification Process Completed: Yes Hospitalized since last visit: No Implantable device outside of the clinic excluding No cellular tissue based products placed in the center since last visit: Has Dressing in Place as Prescribed: Yes Pain Present Now: No Electronic Signature(s) Signed: 06/24/2018 11:33:08 AM By: Secundino Ginger Entered By: Secundino Ginger on 06/24/2018 10:40:05 Ebers, [name] ([id number]) -------------------------------------------------------------------------------- Clinic Level of Care Assessment Details Patient Name: [name] Date of Service: 06/24/2018 10:30 AM Medical Record Number: [id number] Patient Account Number: [id number] Date of Birth/Sex: 05-01-1961 (58 y.o. F) Treating RN: Montey Hora Primary Care Ruqayyah Lute: Clayborn Bigness Other Clinician: Referring Jaspreet Hollings: Clayborn Bigness Treating Momen Ham/Extender: Melburn Hake, HOYT Weeks in Treatment: 7 Clinic Level of Care Assessment Items TOOL 4 Quantity Score []  - Use when only an EandM is performed on FOLLOW-UP visit 0 ASSESSMENTS - Nursing Assessment /  Reassessment X - Reassessment of Co-morbidities (includes updates in patient status) 1 10 X- 1 5 Reassessment of Adherence to Treatment Plan ASSESSMENTS - Wound and Skin Assessment / Reassessment X - Simple Wound Assessment / Reassessment - one wound 1 5 []  - 0 Complex Wound Assessment / Reassessment - multiple wounds []  - 0 Dermatologic / Skin Assessment (not related to wound area) ASSESSMENTS - Focused Assessment []  - Circumferential Edema Measurements - multi extremities 0 []  - 0 Nutritional Assessment / Counseling / Intervention X- 1 5 Lower Extremity Assessment (monofilament, tuning fork, pulses) []  - 0 Peripheral Arterial Disease Assessment (using hand held doppler) ASSESSMENTS - Ostomy and/or Continence Assessment and Care []  - Incontinence Assessment and Management 0 []  - 0 Ostomy Care Assessment and Management (repouching, etc.) PROCESS - Coordination of Care X - Simple Patient / Family Education for ongoing care 1 15 []  - 0 Complex (extensive) Patient / Family Education for ongoing care X- 1 10 Staff obtains Programmer, systems, Records, Test Results / Process Orders []  - 0 Staff telephones HHA, Nursing Homes / Clarify orders / etc []  - 0 Routine Transfer to another Facility (non-emergent condition) []  - 0 Routine Hospital Admission (non-emergent condition) []  - 0 New Admissions / Biomedical engineer / Ordering NPWT, Apligraf, etc. []  - 0 Emergency Hospital Admission (emergent condition) X- 1 10 Simple Discharge Coordination [name], Stacey L. ([id number]) []  - 0 Complex (extensive) Discharge Coordination PROCESS - Special Needs []  - Pediatric / Minor Patient Management 0 []  - 0 Isolation Patient Management []  - 0 Hearing / Language / Visual special needs []  - 0 Assessment of Community assistance (transportation, D/C planning, etc.) []  - 0 Additional assistance / Altered mentation []  - 0 Support Surface(s) Assessment (bed, cushion, seat, etc.) INTERVENTIONS -  Wound Cleansing / Measurement X - Simple Wound Cleansing - one wound 1 5 []  - 0 Complex Wound Cleansing - multiple wounds X- 1 5 Wound Imaging (  photographs - any number of wounds) []  - 0 Wound Tracing (instead of photographs) X- 1 5 Simple Wound Measurement - one wound []  - 0 Complex Wound Measurement - multiple wounds INTERVENTIONS - Wound Dressings X - Small Wound Dressing one or multiple wounds 1 10 []  - 0 Medium Wound Dressing one or multiple wounds []  - 0 Large Wound Dressing one or multiple wounds X- 1 5 Application of Medications - topical []  - 0 Application of Medications - injection INTERVENTIONS - Miscellaneous []  - External ear exam 0 []  - 0 Specimen Collection (cultures, biopsies, blood, body fluids, etc.) []  - 0 Specimen(s) / Culture(s) sent or taken to Lab for analysis []  - 0 Patient Transfer (multiple staff / Civil Service fast streamer / Similar devices) []  - 0 Simple Staple / Suture removal (25 or less) []  - 0 Complex Staple / Suture removal (26 or more) []  - 0 Hypo / Hyperglycemic Management (close monitor of Blood Glucose) []  - 0 Ankle / Brachial Index (ABI) - do not check if billed separately X- 1 5 Vital Signs Farnworth, Guyla L. ([id number]) Has the patient been seen at the hospital within the last three years: Yes Total Score: 95 Level Of Care: New/Established - Level 3 Electronic Signature(s) Signed: 06/24/2018 5:02:11 PM By: Montey Hora Entered By: Montey Hora on 06/24/2018 11:09:08 Plair, [name] ([id number]) -------------------------------------------------------------------------------- Encounter Discharge Information Details Patient Name: [name] Date of Service: 06/24/2018 10:30 AM Medical Record Number: [id number] Patient Account Number: [id number] Date of Birth/Sex: 1960-11-14 (58 y.o. F) Treating RN: Montey Hora Primary Care Beauty Pless: Clayborn Bigness Other Clinician: Referring Arshan Jabs: Clayborn Bigness Treating Ulus Hazen/Extender: Melburn Hake, HOYT Weeks in Treatment: 7 Encounter Discharge Information Items Discharge Condition: Stable Ambulatory Status: Ambulatory Discharge Destination: Home Transportation: Private Auto Accompanied By: self Schedule Follow-up Appointment: Yes Clinical Summary of Care: Electronic Signature(s) Signed: 06/24/2018 5:02:11 PM By: Montey Hora Entered By: Montey Hora on 06/24/2018 11:11:21 Zavadil, [name] ([id number]) -------------------------------------------------------------------------------- Lower Extremity Assessment Details Patient Name: [name] Date of Service: 06/24/2018 10:30 AM Medical Record Number: [id number] Patient Account Number: [id number] Date of Birth/Sex: Dec 16, 1960 (58 y.o. F) Treating RN: Secundino Ginger Primary Care Henryetta Corriveau: Clayborn Bigness Other Clinician: Referring Thersa Mohiuddin: Clayborn Bigness Treating Cleopha Indelicato/Extender: Melburn Hake, HOYT Weeks in Treatment: 7 Edema Assessment Assessed: [Left: No] [Right: No] [Left: Edema] [Right: :] Calf Left: Right: Point of Measurement: 29 cm From Medial Instep 39 cm cm Ankle Left: Right: Point of Measurement: 13 cm From Medial Instep 22 cm cm Vascular Assessment Claudication: Claudication Assessment [Left:None] Pulses: Dorsalis Pedis Palpable: [Left:Yes] Posterior Tibial Extremity colors, hair growth, and conditions: Extremity Color: [Left:Hyperpigmented] Hair Growth on Extremity: [Left:No] Temperature of Extremity: [Left:Warm] Capillary Refill: [Left:< 3 seconds] Toe Nail Assessment Left: Right: Thick: No Discolored: No Deformed: No Improper Length and Hygiene: No Electronic Signature(s) Signed: 06/24/2018 11:33:08 AM By: Secundino Ginger Entered By: Secundino Ginger on 06/24/2018 10:51:45 Etzkorn, [name] ([id number]) -------------------------------------------------------------------------------- Multi Wound Chart Details Patient Name: [name] Date of Service: 06/24/2018 10:30 AM Medical Record Number:  [id number] Patient Account Number: [id number] Date of Birth/Sex: [birth year] (58 y.o. F) Treating RN: Montey Hora Primary Care Rasheedah Reis: Clayborn Bigness Other Clinician: Referring Jessly Lebeck: Clayborn Bigness Treating Harless Molinari/Extender: Melburn Hake, HOYT Weeks in Treatment: 7 Vital Signs Height(in): 64 Pulse(bpm): 78 Weight(lbs): 178 Blood Pressure(mmHg): 139/78 Body Mass Index(BMI): 31 Temperature(F): 98.1 Respiratory Rate 16 (breaths/min): Photos: [1:No Photos] [N/A:N/A] Wound Location: [1:Left, Proximal, Medial Lower Leg] [N/A:N/A] Wounding Event: [1:Surgical Injury] [N/A:N/A] Primary Etiology: [1:Open Surgical Wound] [N/A:N/A]  Date Acquired: [1:04/02/2018] [N/A:N/A] Weeks of Treatment: [1:7] [N/A:N/A] Wound Status: [1:Open] [N/A:N/A] Measurements L x W x D [1:0.3x0.3x0.2] [N/A:N/A] (cm) Area (cm) : [1:0.071] [N/A:N/A] Volume (cm) : [1:0.014] [N/A:N/A] % Reduction in Area: [1:90.60%] [N/A:N/A] % Reduction in Volume: [1:98.10%] [N/A:N/A] Classification: [1:Full Thickness Without Exposed Support Structures] [N/A:N/A] Exudate Amount: [1:Small] [N/A:N/A] Exudate Type: [1:Serous] [N/A:N/A] Exudate Color: [1:amber] [N/A:N/A] Wound Margin: [1:Flat and Intact] [N/A:N/A] Granulation Amount: [1:None Present (0%)] [N/A:N/A] Necrotic Amount: [1:None Present (0%)] [N/A:N/A] Exposed Structures: [1:Fat Layer (Subcutaneous Tissue) Exposed: Yes Fascia: No Tendon: No Muscle: No Joint: No Bone: No] [N/A:N/A] Epithelialization: [1:Small (1-33%)] [N/A:N/A] Periwound Skin Texture: [1:Excoriation: No Induration: No Callus: No Crepitus: No Rash: No Scarring: No] [N/A:N/A] Periwound Skin Moisture: [N/A:N/A] Maceration: No Dry/Scaly: No Periwound Skin Color: Erythema: Yes N/A N/A Atrophie Blanche: No Cyanosis: No Ecchymosis: No Hemosiderin Staining: No Mottled: No Pallor: No Rubor: No Erythema Location: Circumferential N/A N/A Temperature: No Abnormality N/A N/A Tenderness on Palpation: Yes  N/A N/A Wound Preparation: Ulcer Cleansing: N/A N/A Rinsed/Irrigated with Saline Topical Anesthetic Applied: Other: lidocaine 4% Treatment Notes Electronic Signature(s) Signed: 06/24/2018 5:02:11 PM By: Montey Hora Entered By: Montey Hora on 06/24/2018 11:07:52 Bruins, [name] ([id number]) -------------------------------------------------------------------------------- [place] Details Patient Name: [name] Date of Service: 06/24/2018 10:30 AM Medical Record Number: [id number] Patient Account Number: [id number] Date of Birth/Sex: 1960-12-24 (57 y.o. F) Treating RN: Montey Hora Primary Care Maecy Podgurski: Clayborn Bigness Other Clinician: Referring Mainor Hellmann: Clayborn Bigness Treating Rosene Pilling/Extender: Melburn Hake, HOYT Weeks in Treatment: 7 Active Inactive Necrotic Tissue Nursing Diagnoses: Impaired tissue integrity related to necrotic/devitalized tissue Goals: Necrotic/devitalized tissue will be minimized in the wound bed Date Initiated: 06/10/2018 Target Resolution Date: 06/21/2018 Goal Status: Active Interventions: Assess patient pain level pre-, during and post procedure and prior to discharge Treatment Activities: Apply topical anesthetic as ordered : 06/10/2018 Notes: Wound/Skin Impairment Nursing Diagnoses: Knowledge deficit related to ulceration/compromised skin integrity Goals: Ulcer/skin breakdown will have a volume reduction of 30% by week 4 Date Initiated: 05/06/2018 Target Resolution Date: 06/06/2018 Goal Status: Active Interventions: Assess patient/caregiver ability to obtain necessary supplies Assess patient/caregiver ability to perform ulcer/skin care regimen upon admission and as needed Assess ulceration(s) every visit Notes: Electronic Signature(s) Signed: 06/24/2018 5:02:11 PM By: Montey Hora Entered By: Montey Hora on 06/24/2018 11:07:45 Bok, [name]  ([id number]) -------------------------------------------------------------------------------- Pain Assessment Details Patient Name: [name] Date of Service: 06/24/2018 10:30 AM Medical Record Number: [id number] Patient Account Number: [id number] Date of Birth/Sex: [birth year] (58 y.o. F) Treating RN: Secundino Ginger Primary Care Advaith Lamarque: Clayborn Bigness Other Clinician: Referring Shailene Demonbreun: Clayborn Bigness Treating Soffia Doshier/Extender: Melburn Hake, HOYT Weeks in Treatment: 7 Active Problems Location of Pain Severity and Description of Pain Patient Has Paino No Site Locations Pain Management and Medication Current Pain Management: Notes pt denies any pain at this time. Electronic Signature(s) Signed: 06/24/2018 11:33:08 AM By: Secundino Ginger Entered By: Secundino Ginger on 06/24/2018 10:40:26 Persky, [name] ([id number]) -------------------------------------------------------------------------------- Patient/Caregiver Education Details Patient Name: [name] Date of Service: 06/24/2018 10:30 AM Medical Record Number: [id number] Patient Account Number: [id number] Date of Birth/Gender: [birth year] (57 y.o. F) Treating RN: Montey Hora Primary Care Physician: Clayborn Bigness Other Clinician: Referring Physician: Clayborn Bigness Treating Physician/Extender: Sharalyn Ink in Treatment: 7 Education Assessment Education Provided To: Patient Education Topics Provided Wound/Skin Impairment: Handouts: Other: wound care as ordered Methods: Demonstration, Explain/Verbal Responses: State content correctly Electronic Signature(s) Signed: 06/24/2018 5:02:11 PM By: Montey Hora Entered By: Montey Hora on 06/24/2018 11:10:50 Pala, Joelene Millin  L. ([id number]) -------------------------------------------------------------------------------- Wound Assessment Details Patient Name: SHAVONNE, AMBROISE Date of Service: 06/24/2018 10:30 AM Medical Record Number: [id number] Patient Account Number:  [id number] Date of Birth/Sex: [birth year] (57 y.o. F) Treating RN: Montey Hora Primary Care Mick Tanguma: Clayborn Bigness Other Clinician: Referring Boyd Litaker: Clayborn Bigness Treating Antwuan Eckley/Extender: Melburn Hake, HOYT Weeks in Treatment: 7 Wound Status Wound Number: 1 Primary Etiology: Open Surgical Wound Wound Location: Left, Proximal, Medial Lower Leg Wound Status: Open Wounding Event: Surgical Injury Date Acquired: 04/02/2018 Weeks Of Treatment: 7 Clustered Wound: No Photos Photo Uploaded By: Secundino Ginger on 06/24/2018 11:27:41 Wound Measurements Length: (cm) 0.3 Width: (cm) 0.3 Depth: (cm) 0.2 Area: (cm) 0.071 Volume: (cm) 0.014 % Reduction in Area: 90.6% % Reduction in Volume: 98.1% Epithelialization: Small (1-33%) Tunneling: No Undermining: No Wound Description Full Thickness Without Exposed Support Foul Od Classification: Structures Slough/ Wound Margin: Flat and Intact Exudate Small Amount: Exudate Type: Serous Exudate Color: amber or After Cleansing: No Fibrino Yes Wound Bed Granulation Amount: None Present (0%) Exposed Structure Necrotic Amount: None Present (0%) Fascia Exposed: No Fat Layer (Subcutaneous Tissue) Exposed: Yes Tendon Exposed: No Muscle Exposed: No Joint Exposed: No Bone Exposed: No Pantano, Bobbie L. ([id number]) Periwound Skin Texture Texture Color No Abnormalities Noted: No No Abnormalities Noted: No Callus: No Atrophie Blanche: No Crepitus: No Cyanosis: No Excoriation: No Ecchymosis: No Induration: No Erythema: Yes Rash: No Erythema Location: Circumferential Scarring: No Hemosiderin Staining: No Mottled: No Moisture Pallor: No No Abnormalities Noted: No Rubor: No Dry / Scaly: No Maceration: No Temperature / Pain Temperature: No Abnormality Tenderness on Palpation: Yes Wound Preparation Ulcer Cleansing: Rinsed/Irrigated with Saline Topical Anesthetic Applied: Other: lidocaine 4%, Treatment Notes Wound #1 (Left,  Proximal, Medial Lower Leg) Notes Mupiricin and Coverlet. Electronic Signature(s) Signed: 06/24/2018 5:02:11 PM By: Montey Hora Entered By: Montey Hora on 06/24/2018 11:07:38 Alvelo, [name] ([id number]) -------------------------------------------------------------------------------- Vitals Details Patient Name: [name] Date of Service: 06/24/2018 10:30 AM Medical Record Number: [id number] Patient Account Number: [id number] Date of Birth/Sex: [birth year] (58 y.o. F) Treating RN: Secundino Ginger Primary Care Lamerle Jabs: Clayborn Bigness Other Clinician: Referring Taima Rada: Clayborn Bigness Treating Kaniel Kiang/Extender: Melburn Hake, HOYT Weeks in Treatment: 7 Vital Signs Time Taken: 10:40 Temperature (F): 98.1 Height (in): 64 Pulse (bpm): 78 Weight (lbs): 178 Respiratory Rate (breaths/min): 16 Body Mass Index (BMI): 30.6 Blood Pressure (mmHg): 139/78 Reference Range: 80 - 120 mg / dl Airway Electronic Signature(s) Signed: 06/24/2018 11:33:08 AM By: Secundino Ginger Entered By: Secundino Ginger on 06/24/2018 10:41:55

## 2018-07-04 NOTE — Progress Notes (Signed)
YAKIMA, KREITZER ([id number]) Visit Report for 06/10/2018 Chief Complaint Document Details Patient Name: Stacey, [name] Date of Service: 06/10/2018 11:15 AM Medical Record Number: [id number] Patient Account Number: [id number] Date of Birth/Sex: [birth year] (58 y.o. F) Treating RN: Cornell Barman Primary Care Provider: Clayborn Bigness Other Clinician: Referring Provider: Clayborn Bigness Treating Provider/Extender: Melburn Hake, Dymir Neeson Weeks in Treatment: 5 Information Obtained from: Patient Chief Complaint Left lower leg surgical ulcer Electronic Signature(s) Signed: 06/14/2018 9:05:33 PM By: Worthy Keeler PA-C Entered By: Worthy Keeler on 06/10/2018 11:35:00 Clute, [name] ([id number]) -------------------------------------------------------------------------------- HPI Details Patient Name: [name] Date of Service: 06/10/2018 11:15 AM Medical Record Number: [id number] Patient Account Number: [id number] Date of Birth/Sex: 1961-03-04 (58 y.o. F) Treating RN: Cornell Barman Primary Care Provider: Clayborn Bigness Other Clinician: Referring Provider: Clayborn Bigness Treating Provider/Extender: Melburn Hake, Kearsten Ginther Weeks in Treatment: 5 History of Present Illness HPI Description: 05/06/18 on evaluation today patient presents for initial inspection in our clinic concerning an issue that she has been having on her lower extremity as result of a initially biopsy and then subsequently excision of a precancerous lesion which was undertaken by dermatology on 04/02/18. I do not have the pathology report available today for my valuation unfortunately. Nonetheless we are gonna work on getting this from Dr. Elveria Rising office. The patient states that since this was removed and was quite deep sutures were attempted internally to help pull things together and then the area was pulled together utilizing Steri-Strips. Unfortunately it appears that the suture closure but Steri-Strips did not taken the patient is currently  having issues with a dehisced wound where there is significant slough buildup internally. Nonetheless I do believe that this is something that may benefit from sharp debridement and good wound care at this time. Fortunately there is no sign of infection currently. No fevers, chills, nausea, or vomiting noted at this time. The patient is here is a referral from her primary care provider. 05/20/18 on evaluation today patient actually appears to be doing rather well in regard to her lower extremity ulcer. The area of skin that was connecting the small opening to the larger main opening is actually for me now as I expected it likely would. This likely means that we're gonna need to go ahead and see about trimming this area off which I think is the most perfect and help this area to heal appropriately. The patient is in agreement with this plan today. Other than that there's no sign of infection she seems to be doing very well. 05/27/18 on evaluation today patient actually appears to be doing very well in regard to her leg ulcer. The good news is we did get approval for the Snap Vac. With that being said she seems to be doing so well I'm not even certain that this is going to become necessary. She's not quite sure she would want to proceed with this anyway just due to the fact that it would be somewhat more cumbersome than just utilizing the dressing itself. Nonetheless that is definitely an option we discussed that further during the visit. She seems to be healing quite nicely. 06/03/18 on evaluation today patient appears to be doing well in regard to her lower extremity ulcer. She has been tolerating the dressing changes without complication. Fortunately there is no sign of infection she did have some Slough noted on the surface of the wound today. 06/10/18 on evaluation today patient's wound actually appears to be showing some signs of infection unfortunately.  She's also having a little bit of increased  pain which has me somewhat concerned as well. No fevers, chills, nausea, or vomiting noted at this time. Electronic Signature(s) Signed: 06/14/2018 9:05:33 PM By: Worthy Keeler PA-C Entered By: Worthy Keeler on 06/14/2018 20:06:30 Faulk, [name] ([id number]) -------------------------------------------------------------------------------- Physical Exam Details Patient Name: [name] Date of Service: 06/10/2018 11:15 AM Medical Record Number: [id number] Patient Account Number: [id number] Date of Birth/Sex: [birth year] (58 y.o. F) Treating RN: Cornell Barman Primary Care Provider: Clayborn Bigness Other Clinician: Referring Provider: Clayborn Bigness Treating Provider/Extender: Melburn Hake, Emilene Roma Weeks in Treatment: 5 Constitutional Well-nourished and well-hydrated in no acute distress. Respiratory normal breathing without difficulty. clear to auscultation bilaterally. Cardiovascular regular rate and rhythm with normal S1, S2. Psychiatric this patient is able to make decisions and demonstrates good insight into disease process. Alert and Oriented x 3. pleasant and cooperative. Notes Patient's wound bed currently shows signs of good granulation at this time although there was not a big shift or change in the appearance or size of the wound other than the fact it appears she may have some mild cellulitis. There does not appear to be any signs of systemic infection at this time. Electronic Signature(s) Signed: 06/14/2018 9:05:33 PM By: Worthy Keeler PA-C Entered By: Worthy Keeler on 06/14/2018 20:07:03 Hitz, [name] ([id number]) -------------------------------------------------------------------------------- Physician Orders Details Patient Name: [name] Date of Service: 06/10/2018 11:15 AM Medical Record Number: [id number] Patient Account Number: [id number] Date of Birth/Sex: [birth year] (58 y.o. F) Treating RN: Cornell Barman Primary Care Provider: Clayborn Bigness Other  Clinician: Referring Provider: Clayborn Bigness Treating Provider/Extender: Melburn Hake, Maddix Heinz Weeks in Treatment: 5 Verbal / Phone Orders: No Diagnosis Coding ICD-10 Coding Code Description T81.31XA Disruption of external operation (surgical) wound, not elsewhere classified, initial encounter L97.822 Non-pressure chronic ulcer of other part of left lower leg with fat layer exposed Wound Cleansing o May Shower, gently pat wound dry prior to applying new dressing. - Clean wound with Dial antibacterial soap Anesthetic (add to Medication List) Wound #1 Left,Proximal,Medial Lower Leg o Topical Lidocaine 4% cream applied to wound bed prior to debridement (In Clinic Only). Skin Barriers/Peri-Wound Care Wound #1 Left,Proximal,Medial Lower Leg o Other: - Mupiricin ointment Secondary Dressing Wound #1 Left,Proximal,Medial Lower Leg o Boardered Foam Dressing Dressing Change Frequency Wound #1 Left,Proximal,Medial Lower Leg o Change dressing every day. Follow-up Appointments Wound #1 Left,Proximal,Medial Lower Leg o Return Appointment in 1 week. Medications-please add to medication list. Wound #1 Left,Proximal,Medial Lower Leg o Topical Antibiotic Laboratory o Bacteria identified in Wound by Culture (MICRO) - Left lower leg oooo LOINC Code: 5643-3 oooo Convenience Name: Wound culture routine Patient Medications Allergies: No Known Drug Allergies [name], Stacey L. ([id number]) Notifications Medication Indication Start End mupirocin 06/10/2018 DOSE topical 2 % ointment - ointment topical applied to the wound bed daily as directed with each dressing change Cipro 06/14/2018 DOSE 1 - oral 500 mg tablet - 1 tablet oral taken 2 times a day for 10 days Electronic Signature(s) Signed: 06/14/2018 4:57:11 PM By: Worthy Keeler PA-C Previous Signature: 06/10/2018 12:07:57 PM Version By: Worthy Keeler PA-C Entered By: Worthy Keeler on 06/14/2018 16:57:11 Trulson, [name]  ([id number]) -------------------------------------------------------------------------------- Problem List Details Patient Name: [name] Date of Service: 06/10/2018 11:15 AM Medical Record Number: [id number] Patient Account Number: [id number] Date of Birth/Sex: 1960-09-04 (58 y.o. F) Treating RN: Cornell Barman Primary Care Provider: Clayborn Bigness Other Clinician: Referring Provider: Clayborn Bigness Treating Provider/Extender: Melburn Hake,  Emelynn Rance Weeks in Treatment: 5 Active Problems ICD-10 Evaluated Encounter Code Description Active Date Today Diagnosis T81.31XA Disruption of external operation (surgical) wound, not 05/06/2018 No Yes elsewhere classified, initial encounter L97.822 Non-pressure chronic ulcer of other part of left lower leg with 05/06/2018 No Yes fat layer exposed Inactive Problems Resolved Problems Electronic Signature(s) Signed: 06/14/2018 9:05:33 PM By: Worthy Keeler PA-C Entered By: Worthy Keeler on 06/10/2018 11:34:51 Verrill, [name] ([id number]) -------------------------------------------------------------------------------- Progress Note Details Patient Name: [name] Date of Service: 06/10/2018 11:15 AM Medical Record Number: [id number] Patient Account Number: [id number] Date of Birth/Sex: 1960-06-22 (57 y.o. F) Treating RN: Cornell Barman Primary Care Provider: Clayborn Bigness Other Clinician: Referring Provider: Clayborn Bigness Treating Provider/Extender: Melburn Hake, Lakoda Mcanany Weeks in Treatment: 5 Subjective Chief Complaint Information obtained from Patient Left lower leg surgical ulcer History of Present Illness (HPI) 05/06/18 on evaluation today patient presents for initial inspection in our clinic concerning an issue that she has been having on her lower extremity as result of a initially biopsy and then subsequently excision of a precancerous lesion which was undertaken by dermatology on 04/02/18. I do not have the pathology report available today for my  valuation unfortunately. Nonetheless we are gonna work on getting this from Dr. Elveria Rising office. The patient states that since this was removed and was quite deep sutures were attempted internally to help pull things together and then the area was pulled together utilizing Steri-Strips. Unfortunately it appears that the suture closure but Steri-Strips did not taken the patient is currently having issues with a dehisced wound where there is significant slough buildup internally. Nonetheless I do believe that this is something that may benefit from sharp debridement and good wound care at this time. Fortunately there is no sign of infection currently. No fevers, chills, nausea, or vomiting noted at this time. The patient is here is a referral from her primary care provider. 05/20/18 on evaluation today patient actually appears to be doing rather well in regard to her lower extremity ulcer. The area of skin that was connecting the small opening to the larger main opening is actually for me now as I expected it likely would. This likely means that we're gonna need to go ahead and see about trimming this area off which I think is the most perfect and help this area to heal appropriately. The patient is in agreement with this plan today. Other than that there's no sign of infection she seems to be doing very well. 05/27/18 on evaluation today patient actually appears to be doing very well in regard to her leg ulcer. The good news is we did get approval for the Snap Vac. With that being said she seems to be doing so well I'm not even certain that this is going to become necessary. She's not quite sure she would want to proceed with this anyway just due to the fact that it would be somewhat more cumbersome than just utilizing the dressing itself. Nonetheless that is definitely an option we discussed that further during the visit. She seems to be healing quite nicely. 06/03/18 on evaluation today patient appears  to be doing well in regard to her lower extremity ulcer. She has been tolerating the dressing changes without complication. Fortunately there is no sign of infection she did have some Slough noted on the surface of the wound today. 06/10/18 on evaluation today patient's wound actually appears to be showing some signs of infection unfortunately. She's also having a little bit of  increased pain which has me somewhat concerned as well. No fevers, chills, nausea, or vomiting noted at this time. Patient History Information obtained from Patient. Family History Heart Disease - Father,Paternal Grandparents, Hypertension - Father,Paternal Grandparents, No family history of Cancer, Diabetes, Hereditary Spherocytosis, Kidney Disease, Lung Disease, Seizures, Stroke, Thyroid Problems, Tuberculosis. Social History Never smoker, Marital Status - Married, Alcohol Use - Rarely, Drug Use - No History, Caffeine Use - Daily. BRINLY, MAIETTA ([id number]) Medical History Eyes Denies history of Cataracts, Glaucoma, Optic Neuritis Ear/Nose/Mouth/Throat Denies history of Chronic sinus problems/congestion, Middle ear problems Hematologic/Lymphatic Denies history of Anemia, Hemophilia, Human Immunodeficiency Virus, Lymphedema, Sickle Cell Disease Respiratory Denies history of Aspiration, Asthma, Chronic Obstructive Pulmonary Disease (COPD), Pneumothorax, Sleep Apnea, Tuberculosis Cardiovascular Denies history of Angina, Arrhythmia, Congestive Heart Failure, Coronary Artery Disease, Deep Vein Thrombosis, Hypertension, Hypotension, Myocardial Infarction, Peripheral Arterial Disease, Peripheral Venous Disease, Phlebitis, Vasculitis Gastrointestinal Denies history of Cirrhosis , Colitis, Crohn s, Hepatitis A, Hepatitis B, Hepatitis C Endocrine Denies history of Type I Diabetes, Type II Diabetes Genitourinary Denies history of End Stage Renal Disease Immunological Denies history of Lupus Erythematosus, Raynaud  s, Scleroderma Integumentary (Skin) Denies history of History of Burn, History of pressure wounds Musculoskeletal Denies history of Gout, Rheumatoid Arthritis, Osteoarthritis, Osteomyelitis Neurologic Denies history of Dementia, Neuropathy, Quadriplegia, Paraplegia, Seizure Disorder Oncologic Denies history of Received Chemotherapy, Received Radiation Psychiatric Denies history of Anorexia/bulimia, Confinement Anxiety Medical And Surgical History Notes Cardiovascular laser vein treatments and scleratherapy 2019 Musculoskeletal psoriasis Review of Systems (ROS) Constitutional Symptoms (General Health) Denies complaints or symptoms of Fever, Chills. Respiratory The patient has no complaints or symptoms. Cardiovascular The patient has no complaints or symptoms. Psychiatric The patient has no complaints or symptoms. Objective Lindroth, Chappell ([id number]) Constitutional Well-nourished and well-hydrated in no acute distress. Vitals Time Taken: 11:16 AM, Height: 64 in, Weight: 178 lbs, BMI: 30.6, Temperature: 98.2 F, Pulse: 70 bpm, Respiratory Rate: 16 breaths/min, Blood Pressure: 121/87 mmHg. Respiratory normal breathing without difficulty. clear to auscultation bilaterally. Cardiovascular regular rate and rhythm with normal S1, S2. Psychiatric this patient is able to make decisions and demonstrates good insight into disease process. Alert and Oriented x 3. pleasant and cooperative. General Notes: Patient's wound bed currently shows signs of good granulation at this time although there was not a big shift or change in the appearance or size of the wound other than the fact it appears she may have some mild cellulitis. There does not appear to be any signs of systemic infection at this time. Integumentary (Hair, Skin) Wound #1 status is Open. Original cause of wound was Surgical Injury. The wound is located on the Left,Proximal,Medial Lower Leg. The wound measures 0.7cm length  x 1cm width x 0.3cm depth; 0.55cm^2 area and 0.165cm^3 volume. There is Fat Layer (Subcutaneous Tissue) Exposed exposed. There is no tunneling or undermining noted. There is a medium amount of serous drainage noted. The wound margin is flat and intact. There is small (1-33%) red granulation within the wound bed. There is a medium (34-66%) amount of necrotic tissue within the wound bed including Adherent Slough. The periwound skin appearance exhibited: Erythema. The periwound skin appearance did not exhibit: Callus, Crepitus, Excoriation, Induration, Rash, Scarring, Dry/Scaly, Maceration, Atrophie Blanche, Cyanosis, Ecchymosis, Hemosiderin Staining, Mottled, Pallor, Rubor. The surrounding wound skin color is noted with erythema which is circumferential. Periwound temperature was noted as No Abnormality. The periwound has tenderness on palpation. Assessment Active Problems ICD-10 Disruption of external operation (surgical) wound, not elsewhere classified, initial  encounter Non-pressure chronic ulcer of other part of left lower leg with fat layer exposed Plan Wound Cleansing: May Shower, gently pat wound dry prior to applying new dressing. - Clean wound with Dial antibacterial soap Anesthetic (add to Medication List): Wound #1 Left,Proximal,Medial Lower Leg: Topical Lidocaine 4% cream applied to wound bed prior to debridement (In Clinic Only). Skin Barriers/Peri-Wound Care: [name], [name] ([id number]) Wound #1 Left,Proximal,Medial Lower Leg: Other: - Mupiricin ointment Secondary Dressing: Wound #1 Left,Proximal,Medial Lower Leg: Boardered Foam Dressing Dressing Change Frequency: Wound #1 Left,Proximal,Medial Lower Leg: Change dressing every day. Follow-up Appointments: Wound #1 Left,Proximal,Medial Lower Leg: Return Appointment in 1 week. Medications-please add to medication list.: Wound #1 Left,Proximal,Medial Lower Leg: Topical Antibiotic Laboratory ordered were: Wound culture  routine - Left lower leg The following medication(s) was prescribed: mupirocin topical 2 % ointment ointment topical applied to the wound bed daily as directed with each dressing change starting 06/10/2018 Cipro oral 500 mg tablet 1 1 tablet oral taken 2 times a day for 10 days starting 06/14/2018 My suggestion currently is gonna be that we go ahead and initiate the above treatment with the new Pearson ointment over the next week. The patient is in agreement with this plan. We will subsequently see were things stand at follow-up. If anything changes or worsens who in the meantime the patient will contact the office and let me know. Please see above for specific wound care orders. We will see patient for re-evaluation in 1 week(s) here in the clinic. If anything worsens or changes patient will contact our office for additional recommendations. Addendum: I did receive back patient's culture today which revealed that she had Staphylococcus as well is Pseudomonas cultured. Subsequently Servpro will work for both of this was called into the pharmacy for her today. Hopefully this will help to ensure the infection is taken care of and she has no further complications. The patient was in agreement with this plan. Again this was a deep wound culture. Electronic Signature(s) Signed: 06/14/2018 9:05:33 PM By: Worthy Keeler PA-C Entered By: Worthy Keeler on 06/14/2018 20:08:38 Britten, [name] ([id number]) -------------------------------------------------------------------------------- ROS/PFSH Details Patient Name: [name] Date of Service: 06/10/2018 11:15 AM Medical Record Number: [id number] Patient Account Number: [id number] Date of Birth/Sex: [birth year] (58 y.o. F) Treating RN: Cornell Barman Primary Care Provider: Clayborn Bigness Other Clinician: Referring Provider: Clayborn Bigness Treating Provider/Extender: Melburn Hake, Chakita Mcgraw Weeks in Treatment: 5 Information Obtained From Patient Wound  History Do you currently have one or more open woundso Yes How many open wounds do you currently haveo 1 Approximately how long have you had your woundso 5 weeks How have you been treating your wound(s) until nowo open to air Has your wound(s) ever healed and then re-openedo No Have you had any lab work done in the past montho No Have you tested positive for an antibiotic resistant organism (MRSA, VRE)o No Have you tested positive for osteomyelitis (bone infection)o No Have you had any tests for circulation on your legso No Constitutional Symptoms (General Health) Complaints and Symptoms: Negative for: Fever; Chills Eyes Medical History: Negative for: Cataracts; Glaucoma; Optic Neuritis Ear/Nose/Mouth/Throat Medical History: Negative for: Chronic sinus problems/congestion; Middle ear problems Hematologic/Lymphatic Medical History: Negative for: Anemia; Hemophilia; Human Immunodeficiency Virus; Lymphedema; Sickle Cell Disease Respiratory Complaints and Symptoms: No Complaints or Symptoms Medical History: Negative for: Aspiration; Asthma; Chronic Obstructive Pulmonary Disease (COPD); Pneumothorax; Sleep Apnea; Tuberculosis Cardiovascular Complaints and Symptoms: No Complaints or Symptoms Medical History: Negative for: Angina; Arrhythmia; Congestive  Heart Failure; Coronary Artery Disease; Deep Vein Thrombosis; [name], Stacey L. ([id number]) Hypertension; Hypotension; Myocardial Infarction; Peripheral Arterial Disease; Peripheral Venous Disease; Phlebitis; Vasculitis Past Medical History Notes: laser vein treatments and scleratherapy 2019 Gastrointestinal Medical History: Negative for: Cirrhosis ; Colitis; Crohnos; Hepatitis A; Hepatitis B; Hepatitis C Endocrine Medical History: Negative for: Type I Diabetes; Type II Diabetes Genitourinary Medical History: Negative for: End Stage Renal Disease Immunological Medical History: Negative for: Lupus Erythematosus; Raynaudos;  Scleroderma Integumentary (Skin) Medical History: Negative for: History of Burn; History of pressure wounds Musculoskeletal Medical History: Negative for: Gout; Rheumatoid Arthritis; Osteoarthritis; Osteomyelitis Past Medical History Notes: psoriasis Neurologic Medical History: Negative for: Dementia; Neuropathy; Quadriplegia; Paraplegia; Seizure Disorder Oncologic Medical History: Negative for: Received Chemotherapy; Received Radiation Psychiatric Complaints and Symptoms: No Complaints or Symptoms Medical History: Negative for: Anorexia/bulimia; Confinement Anxiety Immunizations Pneumococcal Vaccine: Received Pneumococcal Vaccination: No Immunization Notes: [name], Stacey L. ([id number]) up to date Implantable Devices Family and Social History Cancer: No; Diabetes: No; Heart Disease: Yes - Father,Paternal Grandparents; Hereditary Spherocytosis: No; Hypertension: Yes - Father,Paternal Grandparents; Kidney Disease: No; Lung Disease: No; Seizures: No; Stroke: No; Thyroid Problems: No; Tuberculosis: No; Never smoker; Marital Status - Married; Alcohol Use: Rarely; Drug Use: No History; Caffeine Use: Daily; Financial Concerns: No; Food, Clothing or Shelter Needs: No; Support System Lacking: No; Transportation Concerns: No; Advanced Directives: No; Patient does not want information on Advanced Directives Physician Affirmation I have reviewed and agree with the above information. Electronic Signature(s) Signed: 06/14/2018 9:05:33 PM By: Worthy Keeler PA-C Signed: 07/04/2018 7:49:08 AM By: Gretta Cool, BSN, RN, CWS, Kim RN, BSN Entered By: Worthy Keeler on 06/14/2018 20:06:48 Tomeca, Helm [name] ([id number]) -------------------------------------------------------------------------------- SuperBill Details Patient Name: [name] Date of Service: 06/10/2018 Medical Record Number: [id number] Patient Account Number: [id number] Date of Birth/Sex: 1961/03/24 (58 y.o. F) Treating RN:  Cornell Barman Primary Care Provider: Clayborn Bigness Other Clinician: Referring Provider: Clayborn Bigness Treating Provider/Extender: Melburn Hake, Journe Hallmark Weeks in Treatment: 5 Diagnosis Coding ICD-10 Codes Code Description T81.31XA Disruption of external operation (surgical) wound, not elsewhere classified, initial encounter L97.822 Non-pressure chronic ulcer of other part of left lower leg with fat layer exposed Facility Procedures CPT4 Code: [id number] Description: [facility] VISIT-LEV 3 EST PT Modifier: Quantity: 1 Physician Procedures CPT4: Description Modifier Quantity Code [id number] - WC PHYS LEVEL 4 - EST PT 1 ICD-10 Diagnosis Description T81.31XA Disruption of external operation (surgical) wound, not elsewhere classified, initial encounter C58.850 Non-pressure chronic ulcer of  other part of left lower leg with fat layer exposed Electronic Signature(s) Signed: 06/14/2018 9:05:33 PM By: Worthy Keeler PA-C Entered By: Worthy Keeler on 06/10/2018 23:45:34

## 2018-07-08 ENCOUNTER — Encounter: Payer: Managed Care, Other (non HMO) | Admitting: Physician Assistant

## 2018-07-08 DIAGNOSIS — E11622 Type 2 diabetes mellitus with other skin ulcer: Secondary | ICD-10-CM | POA: Diagnosis not present

## 2018-07-09 NOTE — Progress Notes (Signed)
[name] ([id number]) Visit Report for 07/08/2018 Chief Complaint Document Details Patient Name: Stacey, [name] Date of Service: 07/08/2018 3:15 PM Medical Record Number: [id number] Patient Account Number: [id number] Date of Birth/Sex: 1960-12-05 (58 y.o. F) Treating RN: Harold Barban Primary Care Provider: Clayborn Bigness Other Clinician: Referring Provider: Clayborn Bigness Treating Provider/Extender: Melburn Hake, HOYT Weeks in Treatment: 9 Information Obtained from: Patient Chief Complaint Left lower leg surgical ulcer Electronic Signature(s) Signed: 07/09/2018 8:52:26 AM By: Worthy Keeler PA-C Entered By: Worthy Keeler on 07/08/2018 15:28:44 [name], [name] ([id number]) -------------------------------------------------------------------------------- HPI Details Patient Name: [name] Date of Service: 07/08/2018 3:15 PM Medical Record Number: [id number] Patient Account Number: [id number] Date of Birth/Sex: [birth year] (57 y.o. F) Treating RN: Harold Barban Primary Care Provider: Clayborn Bigness Other Clinician: Referring Provider: Clayborn Bigness Treating Provider/Extender: Melburn Hake, HOYT Weeks in Treatment: 9 History of Present Illness HPI Description: 05/06/18 on evaluation today patient presents for initial inspection in our clinic concerning an issue that she has been having on her lower extremity as result of a initially biopsy and then subsequently excision of a precancerous lesion which was undertaken by dermatology on 04/02/18. I do not have the pathology report available today for my valuation unfortunately. Nonetheless we are gonna work on getting this from Dr. Elveria Rising office. The patient states that since this was removed and was quite deep sutures were attempted internally to help pull things together and then the area was pulled together utilizing Steri-Strips. Unfortunately it appears that the suture closure but Steri-Strips did not taken the patient  is currently having issues with a dehisced wound where there is significant slough buildup internally. Nonetheless I do believe that this is something that may benefit from sharp debridement and good wound care at this time. Fortunately there is no sign of infection currently. No fevers, chills, nausea, or vomiting noted at this time. The patient is here is a referral from her primary care provider. 05/20/18 on evaluation today patient actually appears to be doing rather well in regard to her lower extremity ulcer. The area of skin that was connecting the small opening to the larger main opening is actually for me now as I expected it likely would. This likely means that we're gonna need to go ahead and see about trimming this area off which I think is the most perfect and help this area to heal appropriately. The patient is in agreement with this plan today. Other than that there's no sign of infection she seems to be doing very well. 05/27/18 on evaluation today patient actually appears to be doing very well in regard to her leg ulcer. The good news is we did get approval for the Snap Vac. With that being said she seems to be doing so well I'm not even certain that this is going to become necessary. She's not quite sure she would want to proceed with this anyway just due to the fact that it would be somewhat more cumbersome than just utilizing the dressing itself. Nonetheless that is definitely an option we discussed that further during the visit. She seems to be healing quite nicely. 06/03/18 on evaluation today patient appears to be doing well in regard to her lower extremity ulcer. She has been tolerating the dressing changes without complication. Fortunately there is no sign of infection she did have some Slough noted on the surface of the wound today. 06/10/18 on evaluation today patient's wound actually appears to be showing some signs of infection unfortunately.  She's also having a little bit  of increased pain which has me somewhat concerned as well. No fevers, chills, nausea, or vomiting noted at this time. 06/17/18 on evaluation today patient actually appears to be doing very well in regard to her lower Trinity ulcer. In fact this seems to be showing signs of excellent improvement when compared to last week she's not having the pain she was having last week and overall this is excellent news. I'm very pleased with how things have progressed. With that being said I do think that we may just want to keep with the and about equipment at this point since she has done so well I did add support was well attended last week for her based on the culture results. It did show that she had a Pseudomonas infection as well. 06/24/18 on evaluation today patient appears to be doing excellent in regard to her left lower extremity ulcer. She has been tolerating the dressing changes without complication. Fortunately she seems to have made excellent progress even since last week when I saw her. Overall I'm very happy with the way things have been going. 07/08/18 on evaluation today patient appears to be doing rather well in regard to her ulcer although she was hoping this was completely healed but still very small opening still remaining at this point. Fortunately there's no signs of infection which is maintenance. No fevers, chills, nausea, or vomiting noted at this time. Electronic Signature(s) [name] ([id number]) Signed: 07/09/2018 8:52:26 AM By: Worthy Keeler PA-C Entered By: Worthy Keeler on 07/08/2018 22:55:55 [name] ([id number]) -------------------------------------------------------------------------------- Physical Exam Details Patient Name: [name] Date of Service: 07/08/2018 3:15 PM Medical Record Number: [id number] Patient Account Number: [id number] Date of Birth/Sex: 1960/05/19 (58 y.o. F) Treating RN: Harold Barban Primary Care Provider: Clayborn Bigness  Other Clinician: Referring Provider: Clayborn Bigness Treating Provider/Extender: STONE III, HOYT Weeks in Treatment: 9 Constitutional Well-nourished and well-hydrated in no acute distress. Respiratory normal breathing without difficulty. clear to auscultation bilaterally. Cardiovascular regular rate and rhythm with normal S1, S2. Psychiatric this patient is able to make decisions and demonstrates good insight into disease process. Alert and Oriented x 3. pleasant and cooperative. Notes Patient's wound did not require sharp debridement today she still has a little bit more of an area to completely heal before this will be completed. Nonetheless overall she is done extremely well in my pinion I'm very pleased with the progress that has been made. Electronic Signature(s) Signed: 07/09/2018 8:52:26 AM By: Worthy Keeler PA-C Entered By: Worthy Keeler on 07/08/2018 22:56:23 [place], [name] ([id number]) -------------------------------------------------------------------------------- Physician Orders Details Patient Name: [name] Date of Service: 07/08/2018 3:15 PM Medical Record Number: [id number] Patient Account Number: [id number] Date of Birth/Sex: 22-Dec-1960 (58 y.o. F) Treating RN: Harold Barban Primary Care Provider: Clayborn Bigness Other Clinician: Referring Provider: Clayborn Bigness Treating Provider/Extender: Melburn Hake, HOYT Weeks in Treatment: 9 Verbal / Phone Orders: No Diagnosis Coding ICD-10 Coding Code Description T81.31XA Disruption of external operation (surgical) wound, not elsewhere classified, initial encounter [phone] Non-pressure chronic ulcer of other part of left lower leg with fat layer exposed Wound Cleansing o May Shower, gently pat wound dry prior to applying new dressing. - Clean wound with Dial antibacterial soap Anesthetic (add to Medication List) Wound #1 Left,Proximal,Medial Lower Leg o Topical Lidocaine 4% cream applied to wound bed prior to  debridement (In Clinic Only). Primary Wound Dressing Wound #1 Left,Proximal,Medial Lower Leg o Silver Collagen -  Hydragel Secondary Dressing Wound #1 Left,Proximal,Medial Lower Leg o Boardered Foam Dressing - Band aid Dressing Change Frequency Wound #1 Left,Proximal,Medial Lower Leg o Change dressing every other day. Follow-up Appointments Wound #1 Left,Proximal,Medial Lower Leg o Return Appointment in 2 weeks. Medications-please add to medication list. Wound #1 Left,Proximal,Medial Lower Leg o Topical Antibiotic Electronic Signature(s) Signed: 07/09/2018 8:52:26 AM By: Worthy Keeler PA-C Signed: 07/09/2018 11:13:25 AM By: Harold Barban Entered By: Harold Barban on 07/08/2018 16:17:10 [place], [name] ([id number]) -------------------------------------------------------------------------------- Problem List Details Patient Name: [name] Date of Service: 07/08/2018 3:15 PM Medical Record Number: [id number] Patient Account Number: [id number] Date of Birth/Sex: [birth year] (58 y.o. F) Treating RN: Harold Barban Primary Care Provider: Clayborn Bigness Other Clinician: Referring Provider: Clayborn Bigness Treating Provider/Extender: Melburn Hake, HOYT Weeks in Treatment: 9 Active Problems ICD-10 Evaluated Encounter Code Description Active Date Today Diagnosis T81.31XA Disruption of external operation (surgical) wound, not 05/06/2018 No Yes elsewhere classified, initial encounter L97.822 Non-pressure chronic ulcer of other part of left lower leg with 05/06/2018 No Yes fat layer exposed Inactive Problems Resolved Problems Electronic Signature(s) Signed: 07/09/2018 8:52:26 AM By: Worthy Keeler PA-C Entered By: Worthy Keeler on 07/08/2018 15:28:40 [place], [name] ([id number]) -------------------------------------------------------------------------------- Progress Note Details Patient Name: [name] Date of Service: 07/08/2018 3:15 PM Medical Record  Number: [id number] Patient Account Number: [id number] Date of Birth/Sex: 12/28/1960 (58 y.o. F) Treating RN: Harold Barban Primary Care Provider: Clayborn Bigness Other Clinician: Referring Provider: Clayborn Bigness Treating Provider/Extender: Melburn Hake, HOYT Weeks in Treatment: 9 Subjective Chief Complaint Information obtained from Patient Left lower leg surgical ulcer History of Present Illness (HPI) 05/06/18 on evaluation today patient presents for initial inspection in our clinic concerning an issue that she has been having on her lower extremity as result of a initially biopsy and then subsequently excision of a precancerous lesion which was undertaken by dermatology on 04/02/18. I do not have the pathology report available today for my valuation unfortunately. Nonetheless we are gonna work on getting this from Dr. Elveria Rising office. The patient states that since this was removed and was quite deep sutures were attempted internally to help pull things together and then the area was pulled together utilizing Steri-Strips. Unfortunately it appears that the suture closure but Steri-Strips did not taken the patient is currently having issues with a dehisced wound where there is significant slough buildup internally. Nonetheless I do believe that this is something that may benefit from sharp debridement and good wound care at this time. Fortunately there is no sign of infection currently. No fevers, chills, nausea, or vomiting noted at this time. The patient is here is a referral from her primary care provider. 05/20/18 on evaluation today patient actually appears to be doing rather well in regard to her lower extremity ulcer. The area of skin that was connecting the small opening to the larger main opening is actually for me now as I expected it likely would. This likely means that we're gonna need to go ahead and see about trimming this area off which I think is the most perfect and help this area to  heal appropriately. The patient is in agreement with this plan today. Other than that there's no sign of infection she seems to be doing very well. 05/27/18 on evaluation today patient actually appears to be doing very well in regard to her leg ulcer. The good news is we did get approval for the Snap Vac. With that being said she seems to be  doing so well I'm not even certain that this is going to become necessary. She's not quite sure she would want to proceed with this anyway just due to the fact that it would be somewhat more cumbersome than just utilizing the dressing itself. Nonetheless that is definitely an option we discussed that further during the visit. She seems to be healing quite nicely. 06/03/18 on evaluation today patient appears to be doing well in regard to her lower extremity ulcer. She has been tolerating the dressing changes without complication. Fortunately there is no sign of infection she did have some Slough noted on the surface of the wound today. 06/10/18 on evaluation today patient's wound actually appears to be showing some signs of infection unfortunately. She's also having a little bit of increased pain which has me somewhat concerned as well. No fevers, chills, nausea, or vomiting noted at this time. 06/17/18 on evaluation today patient actually appears to be doing very well in regard to her lower Trinity ulcer. In fact this seems to be showing signs of excellent improvement when compared to last week she's not having the pain she was having last week and overall this is excellent news. I'm very pleased with how things have progressed. With that being said I do think that we may just want to keep with the and about equipment at this point since she has done so well I did add support was well attended last week for her based on the culture results. It did show that she had a Pseudomonas infection as well. 06/24/18 on evaluation today patient appears to be doing excellent in  regard to her left lower extremity ulcer. She has been tolerating the dressing changes without complication. Fortunately she seems to have made excellent progress even since last week when I saw her. Overall I'm very happy with the way things have been going. 07/08/18 on evaluation today patient appears to be doing rather well in regard to her ulcer although she was hoping this was [place] ([id number]) completely healed but still very small opening still remaining at this point. Fortunately there's no signs of infection which is maintenance. No fevers, chills, nausea, or vomiting noted at this time. Patient History Information obtained from Patient. Family History Heart Disease - Father,Paternal Grandparents, Hypertension - Father,Paternal Grandparents, No family history of Cancer, Diabetes, Hereditary Spherocytosis, Kidney Disease, Lung Disease, Seizures, Stroke, Thyroid Problems, Tuberculosis. Social History Never smoker, Marital Status - Married, Alcohol Use - Rarely, Drug Use - No History, Caffeine Use - Daily. Medical History Eyes Denies history of Cataracts, Glaucoma, Optic Neuritis Ear/Nose/Mouth/Throat Denies history of Chronic sinus problems/congestion, Middle ear problems Hematologic/Lymphatic Denies history of Anemia, Hemophilia, Human Immunodeficiency Virus, Lymphedema, Sickle Cell Disease Respiratory Denies history of Aspiration, Asthma, Chronic Obstructive Pulmonary Disease (COPD), Pneumothorax, Sleep Apnea, Tuberculosis Cardiovascular Denies history of Angina, Arrhythmia, Congestive Heart Failure, Coronary Artery Disease, Deep Vein Thrombosis, Hypertension, Hypotension, Myocardial Infarction, Peripheral Arterial Disease, Peripheral Venous Disease, Phlebitis, Vasculitis Gastrointestinal Denies history of Cirrhosis , Colitis, Crohn s, Hepatitis A, Hepatitis B, Hepatitis C Endocrine Denies history of Type I Diabetes, Type II Diabetes Genitourinary Denies history  of End Stage Renal Disease Immunological Denies history of Lupus Erythematosus, Raynaud s, Scleroderma Integumentary (Skin) Denies history of History of Burn, History of pressure wounds Musculoskeletal Denies history of Gout, Rheumatoid Arthritis, Osteoarthritis, Osteomyelitis Neurologic Denies history of Dementia, Neuropathy, Quadriplegia, Paraplegia, Seizure Disorder Oncologic Denies history of Received Chemotherapy, Received Radiation Psychiatric Denies history of Anorexia/bulimia, Confinement Anxiety Medical And Surgical  History Notes Cardiovascular laser vein treatments and scleratherapy 2019 Musculoskeletal psoriasis Review of Systems (ROS) Constitutional Symptoms (General Health) Denies complaints or symptoms of Fever, Chills. Respiratory The patient has no complaints or symptoms. Cardiovascular [name], Stacey ([id number]) The patient has no complaints or symptoms. Psychiatric The patient has no complaints or symptoms. Objective Constitutional Well-nourished and well-hydrated in no acute distress. Vitals Time Taken: 3:43 PM, Height: 64 in, Weight: 178 lbs, BMI: 30.6, Temperature: 98.1 F, Pulse: 75 bpm, Respiratory Rate: 16 breaths/min, Blood Pressure: 127/74 mmHg. Respiratory normal breathing without difficulty. clear to auscultation bilaterally. Cardiovascular regular rate and rhythm with normal S1, S2. Psychiatric this patient is able to make decisions and demonstrates good insight into disease process. Alert and Oriented x 3. pleasant and cooperative. General Notes: Patient's wound did not require sharp debridement today she still has a little bit more of an area to completely heal before this will be completed. Nonetheless overall she is done extremely well in my pinion I'm very pleased with the progress that has been made. Integumentary (Hair, Skin) Wound #1 status is Open. Original cause of wound was Surgical Injury. The wound is located on the  Left,Proximal,Medial Lower Leg. The wound measures 0.1cm length x 0.1cm width x 0.1cm depth; 0.008cm^2 area and 0.001cm^3 volume. There is Fat Layer (Subcutaneous Tissue) Exposed exposed. There is no tunneling or undermining noted. There is a small amount of serous drainage noted. The wound margin is flat and intact. There is no granulation within the wound bed. There is no necrotic tissue within the wound bed. The periwound skin appearance did not exhibit: Callus, Crepitus, Excoriation, Induration, Rash, Scarring, Dry/Scaly, Maceration, Atrophie Blanche, Cyanosis, Ecchymosis, Hemosiderin Staining, Mottled, Pallor, Rubor, Erythema. Periwound temperature was noted as No Abnormality. The periwound has tenderness on palpation. Assessment Active Problems ICD-10 Disruption of external operation (surgical) wound, not elsewhere classified, initial encounter Non-pressure chronic ulcer of other part of left lower leg with fat layer exposed [name], [place] ([id number]) Plan Wound Cleansing: May Shower, gently pat wound dry prior to applying new dressing. - Clean wound with Dial antibacterial soap Anesthetic (add to Medication List): Wound #1 Left,Proximal,Medial Lower Leg: Topical Lidocaine 4% cream applied to wound bed prior to debridement (In Clinic Only). Primary Wound Dressing: Wound #1 Left,Proximal,Medial Lower Leg: Silver Collagen - Hydragel Secondary Dressing: Wound #1 Left,Proximal,Medial Lower Leg: Boardered Foam Dressing - Band aid Dressing Change Frequency: Wound #1 Left,Proximal,Medial Lower Leg: Change dressing every other day. Follow-up Appointments: Wound #1 Left,Proximal,Medial Lower Leg: Return Appointment in 2 weeks. Medications-please add to medication list.: Wound #1 Left,Proximal,Medial Lower Leg: Topical Antibiotic I'm in a recommend that we see her back for follow-up in two weeks time to see were things stand she's in agreement the plan. If anything changes worsens  meantime shall contact the office let me know. Please see above for specific wound care orders. We will see patient for re-evaluation in 1 week(s) here in the clinic. If anything worsens or changes patient will contact our office for additional recommendations. Electronic Signature(s) Signed: 07/09/2018 8:52:26 AM By: Worthy Keeler PA-C Entered By: Worthy Keeler on 07/08/2018 22:56:31 [place], [name] ([id number]) -------------------------------------------------------------------------------- ROS/PFSH Details Patient Name: [name] Date of Service: 07/08/2018 3:15 PM Medical Record Number: [id number] Patient Account Number: [id number] Date of Birth/Sex: [birth year] (58 y.o. F) Treating RN: Harold Barban Primary Care Provider: Clayborn Bigness Other Clinician: Referring Provider: Clayborn Bigness Treating Provider/Extender: Melburn Hake, HOYT Weeks in Treatment: 9 Information Obtained From Patient Wound History  Do you currently have one or more open woundso Yes How many open wounds do you currently haveo 1 Approximately how long have you had your woundso 5 weeks How have you been treating your wound(s) until nowo open to air Has your wound(s) ever healed and then re-openedo No Have you had any lab work done in the past montho No Have you tested positive for an antibiotic resistant organism (MRSA, VRE)o No Have you tested positive for osteomyelitis (bone infection)o No Have you had any tests for circulation on your legso No Constitutional Symptoms (General Health) Complaints and Symptoms: Negative for: Fever; Chills Eyes Medical History: Negative for: Cataracts; Glaucoma; Optic Neuritis Ear/Nose/Mouth/Throat Medical History: Negative for: Chronic sinus problems/congestion; Middle ear problems Hematologic/Lymphatic Medical History: Negative for: Anemia; Hemophilia; Human Immunodeficiency Virus; Lymphedema; Sickle Cell Disease Respiratory Complaints and Symptoms: No Complaints  or Symptoms Medical History: Negative for: Aspiration; Asthma; Chronic Obstructive Pulmonary Disease (COPD); Pneumothorax; Sleep Apnea; Tuberculosis Cardiovascular Complaints and Symptoms: No Complaints or Symptoms Medical History: Negative for: Angina; Arrhythmia; Congestive Heart Failure; Coronary Artery Disease; Deep Vein Thrombosis; [name], Stacey L. ([id number]) Hypertension; Hypotension; Myocardial Infarction; Peripheral Arterial Disease; Peripheral Venous Disease; Phlebitis; Vasculitis Past Medical History Notes: laser vein treatments and scleratherapy 2019 Gastrointestinal Medical History: Negative for: Cirrhosis ; Colitis; Crohnos; Hepatitis A; Hepatitis B; Hepatitis C Endocrine Medical History: Negative for: Type I Diabetes; Type II Diabetes Genitourinary Medical History: Negative for: End Stage Renal Disease Immunological Medical History: Negative for: Lupus Erythematosus; Raynaudos; Scleroderma Integumentary (Skin) Medical History: Negative for: History of Burn; History of pressure wounds Musculoskeletal Medical History: Negative for: Gout; Rheumatoid Arthritis; Osteoarthritis; Osteomyelitis Past Medical History Notes: psoriasis Neurologic Medical History: Negative for: Dementia; Neuropathy; Quadriplegia; Paraplegia; Seizure Disorder Oncologic Medical History: Negative for: Received Chemotherapy; Received Radiation Psychiatric Complaints and Symptoms: No Complaints or Symptoms Medical History: Negative for: Anorexia/bulimia; Confinement Anxiety Immunizations Pneumococcal Vaccine: Received Pneumococcal Vaccination: No Immunization Notes: [name], Stacey L. ([id number]) up to date Implantable Devices No devices added Family and Social History Cancer: No; Diabetes: No; Heart Disease: Yes - Father,Paternal Grandparents; Hereditary Spherocytosis: No; Hypertension: Yes - Father,Paternal Grandparents; Kidney Disease: No; Lung Disease: No; Seizures: No;  Stroke: No; Thyroid Problems: No; Tuberculosis: No; Never smoker; Marital Status - Married; Alcohol Use: Rarely; Drug Use: No History; Caffeine Use: Daily; Financial Concerns: No; Food, Clothing or Shelter Needs: No; Support System Lacking: No; Transportation Concerns: No; Advanced Directives: No; Patient does not want information on Advanced Directives Physician Affirmation I have reviewed and agree with the above information. Electronic Signature(s) Signed: 07/09/2018 8:52:26 AM By: Worthy Keeler PA-C Signed: 07/09/2018 11:13:25 AM By: Harold Barban Entered By: Worthy Keeler on 07/08/2018 60:73:71 [name], [name] ([id number]) -------------------------------------------------------------------------------- SuperBill Details Patient Name: [name] Date of Service: 07/08/2018 Medical Record Number: [id number] Patient Account Number: [id number] Date of Birth/Sex: April 28, 1961 (58 y.o. F) Treating RN: Harold [facility] Primary Care Provider: Clayborn Bigness Other Clinician: Referring Provider: Clayborn Bigness Treating Provider/Extender: Melburn Hake, HOYT Weeks in Treatment: 9 Diagnosis Coding ICD-10 Codes Code Description T81.31XA Disruption of external operation (surgical) wound, not elsewhere classified, initial encounter L97.822 Non-pressure chronic ulcer of other part of left lower leg with fat layer exposed Facility Procedures CPT4 Code: [id number] Description: 71696 - WOUND CARE VISIT-LEV 2 EST PT Modifier: Quantity: 1 Physician Procedures CPT4: Description Modifier Quantity Code [id number] 99214 - WC PHYS LEVEL 4 - EST PT 1 ICD-10 Diagnosis Description T81.31XA Disruption of external operation (surgical) wound, not elsewhere classified, initial encounter 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

## 2018-07-09 NOTE — Progress Notes (Signed)
[name], [name] ([id number]) Visit Report for 07/08/2018 Arrival Information Details Patient Name: [name], [name] Date of Service: 07/08/2018 3:15 PM Medical Record Number: [id number] Patient Account Number: [id number] Date of Birth/Sex: 03-20-1961 (58 y.o. F) Treating RN: Secundino Ginger Primary Care Pier Laux: Clayborn Bigness Other Clinician: Referring Keta [place]: Clayborn Bigness Treating Genevie Elman/Extender: Melburn Hake, HOYT Weeks in Treatment: 9 Visit Information History Since Last Visit Added or deleted any medications: No Patient Arrived: Ambulatory Any new allergies or adverse reactions: No Arrival Time: 15:39 Had a fall or experienced change in No Accompanied By: self activities of daily living that may affect Transfer Assistance: None risk of falls: Patient Identification Verified: Yes Signs or symptoms of abuse/neglect since last visito No Secondary Verification Process Completed: Yes Hospitalized since last visit: No Implantable device outside of the clinic excluding No cellular tissue based products placed in the center since last visit: Has Dressing in Place as Prescribed: Yes Pain Present Now: No Electronic Signature(s) Signed: 07/08/2018 4:13:49 PM By: Secundino Ginger Entered By: Secundino Ginger on 07/08/2018 15:42:35 [name], [name] ([id number]) -------------------------------------------------------------------------------- Clinic Level of Care Assessment Details Patient Name: [name] Date of Service: 07/08/2018 3:15 PM Medical Record Number: [id number] Patient Account Number: [id number] Date of Birth/Sex: 1961-01-29 (58 y.o. F) Treating RN: Harold Barban Primary Care Ari Bernabei: Clayborn Bigness Other Clinician: Referring Kyrollos Cordell: Clayborn Bigness Treating Laiya Wisby/Extender: Melburn Hake, HOYT Weeks in Treatment: 9 Clinic Level of Care Assessment Items TOOL 4 Quantity Score []  - Use when only an EandM is performed on FOLLOW-UP visit 0 ASSESSMENTS - Nursing Assessment /  Reassessment X - Reassessment of Co-morbidities (includes updates in patient status) 1 10 X- 1 5 Reassessment of Adherence to Treatment Plan ASSESSMENTS - Wound and Skin Assessment / Reassessment X - Simple Wound Assessment / Reassessment - one wound 1 5 []  - 0 Complex Wound Assessment / Reassessment - multiple wounds []  - 0 Dermatologic / Skin Assessment (not related to wound area) ASSESSMENTS - Focused Assessment []  - Circumferential Edema Measurements - multi extremities 0 []  - 0 Nutritional Assessment / Counseling / Intervention []  - 0 Lower Extremity Assessment (monofilament, tuning fork, pulses) []  - 0 Peripheral Arterial Disease Assessment (using hand held doppler) ASSESSMENTS - Ostomy and/or Continence Assessment and Care []  - Incontinence Assessment and Management 0 []  - 0 Ostomy Care Assessment and Management (repouching, etc.) PROCESS - Coordination of Care []  - Simple Patient / Family Education for ongoing care 0 []  - 0 Complex (extensive) Patient / Family Education for ongoing care []  - 0 Staff obtains Programmer, systems, Records, Test Results / Process Orders []  - 0 Staff telephones HHA, Nursing Homes / Clarify orders / etc []  - 0 Routine Transfer to another Facility (non-emergent condition) []  - 0 Routine Hospital Admission (non-emergent condition) []  - 0 New Admissions / Biomedical engineer / Ordering NPWT, Apligraf, etc. []  - 0 Emergency Hospital Admission (emergent condition) X- 1 10 Simple Discharge Coordination [name], Stacey L. ([id number]) []  - 0 Complex (extensive) Discharge Coordination PROCESS - Special Needs []  - Pediatric / Minor Patient Management 0 []  - 0 Isolation Patient Management []  - 0 Hearing / Language / Visual special needs []  - 0 Assessment of Community assistance (transportation, D/C planning, etc.) []  - 0 Additional assistance / Altered mentation []  - 0 Support Surface(s) Assessment (bed, cushion, seat, etc.) INTERVENTIONS -  Wound Cleansing / Measurement X - Simple Wound Cleansing - one wound 1 5 []  - 0 Complex Wound Cleansing - multiple wounds []  - 0 Wound Imaging (photographs -  any number of wounds) []  - 0 Wound Tracing (instead of photographs) X- 1 5 Simple Wound Measurement - one wound []  - 0 Complex Wound Measurement - multiple wounds INTERVENTIONS - Wound Dressings []  - Small Wound Dressing one or multiple wounds 0 []  - 0 Medium Wound Dressing one or multiple wounds []  - 0 Large Wound Dressing one or multiple wounds []  - 0 Application of Medications - topical []  - 0 Application of Medications - injection INTERVENTIONS - Miscellaneous []  - External ear exam 0 []  - 0 Specimen Collection (cultures, biopsies, blood, body fluids, etc.) []  - 0 Specimen(s) / Culture(s) sent or taken to Lab for analysis []  - 0 Patient Transfer (multiple staff / Civil Service fast streamer / Similar devices) []  - 0 Simple Staple / Suture removal (25 or less) []  - 0 Complex Staple / Suture removal (26 or more) []  - 0 Hypo / Hyperglycemic Management (close monitor of Blood Glucose) []  - 0 Ankle / Brachial Index (ABI) - do not check if billed separately X- 1 5 Vital Signs [name], Stacey L. ([id number]) Has the patient been seen at the hospital within the last three years: Yes Total Score: 45 Level Of Care: New/Established - Level 2 Electronic Signature(s) Signed: 07/09/2018 11:13:25 AM By: Harold Barban Entered By: Harold Barban on 07/08/2018 16:17:33 [name], [name] ([id number]) -------------------------------------------------------------------------------- Encounter Discharge Information Details Patient Name: [name] Date of Service: 07/08/2018 3:15 PM Medical Record Number: [id number] Patient Account Number: [id number] Date of Birth/Sex: 10-21-1960 (58 y.o. F) Treating RN: Army Melia Primary Care Talon Witting: Clayborn Bigness Other Clinician: Referring Julizza Sassone: Clayborn Bigness Treating Keoshia Steinmetz/Extender: Melburn Hake, HOYT Weeks in Treatment: 9 Encounter Discharge Information Items Discharge Condition: Stable Ambulatory Status: Ambulatory Discharge Destination: Home Transportation: Private Auto Accompanied By: self Schedule Follow-up Appointment: Yes Clinical Summary of Care: Electronic Signature(s) Signed: 07/08/2018 4:24:59 PM By: Army Melia Entered By: Army Melia on 07/08/2018 16:24:59 Gearheart, [name] ([id number]) -------------------------------------------------------------------------------- Lower Extremity Assessment Details Patient Name: [name] Date of Service: 07/08/2018 3:15 PM Medical Record Number: [id number] Patient Account Number: [id number] Date of Birth/Sex: [birth year] (58 y.o. F) Treating RN: Secundino Ginger Primary Care Marelyn Rouser: Clayborn Bigness Other Clinician: Referring Gaynor Ferreras: Clayborn Bigness Treating Trayven Lumadue/Extender: Melburn Hake, HOYT Weeks in Treatment: 9 Edema Assessment Assessed: [Left: No] [Right: No] [Left: Edema] [Right: :] Calf Left: Right: Point of Measurement: 29 cm From Medial Instep 38 cm cm Ankle Left: Right: Point of Measurement: 13 cm From Medial Instep 23 cm cm Vascular Assessment Pulses: Dorsalis Pedis Palpable: [Left:Yes] Posterior Tibial Extremity colors, hair growth, and conditions: Extremity Color: [Left:Hyperpigmented] Hair Growth on Extremity: [Left:No] Temperature of Extremity: [Left:Warm] Capillary Refill: [Left:< 3 seconds] Toe Nail Assessment Left: Right: Thick: No Discolored: No Deformed: No Improper Length and Hygiene: No Electronic Signature(s) Signed: 07/08/2018 4:13:49 PM By: Secundino Ginger Entered By: Secundino Ginger on 07/08/2018 15:50:34 [name], [name] ([id number]) -------------------------------------------------------------------------------- Multi Wound Chart Details Patient Name: [name] Date of Service: 07/08/2018 3:15 PM Medical Record Number: [id number] Patient Account Number: [id number] Date of  Birth/Sex: 12-Jan-1961 (58 y.o. F) Treating RN: Harold Barban Primary Care Brittlyn Cloe: Clayborn Bigness Other Clinician: Referring Robertlee Rogacki: Clayborn Bigness Treating Autumm Hattery/Extender: Melburn Hake, HOYT Weeks in Treatment: 9 Vital Signs Height(in): 64 Pulse(bpm): 75 Weight(lbs): 178 Blood Pressure(mmHg): 127/74 Body Mass Index(BMI): 31 Temperature(F): 98.1 Respiratory Rate 16 (breaths/min): Photos: [1:No Photos] [N/A:N/A] Wound Location: [1:Left Lower Leg - Medial, Proximal] [N/A:N/A] Wounding Event: [1:Surgical Injury] [N/A:N/A] Primary Etiology: [1:Open Surgical Wound] [N/A:N/A] Date Acquired: [1:04/02/2018] [N/A:N/A] Weeks of  Treatment: [1:9] [N/A:N/A] Wound Status: [1:Open] [N/A:N/A] Measurements L x W x D [1:0.1x0.1x0.1] [N/A:N/A] (cm) Area (cm) : [6:7.124] [N/A:N/A] Volume (cm) : [1:0.001] [N/A:N/A] % Reduction in Area: [1:98.90%] [N/A:N/A] % Reduction in Volume: [1:99.90%] [N/A:N/A] Classification: [1:Full Thickness Without Exposed Support Structures] [N/A:N/A] Exudate Amount: [1:Small] [N/A:N/A] Exudate Type: [1:Serous] [N/A:N/A] Exudate Color: [1:amber] [N/A:N/A] Wound Margin: [1:Flat and Intact] [N/A:N/A] Granulation Amount: [1:None Present (0%)] [N/A:N/A] Necrotic Amount: [1:None Present (0%)] [N/A:N/A] Exposed Structures: [1:Fat Layer (Subcutaneous Tissue) Exposed: Yes Fascia: No Tendon: No Muscle: No Joint: No Bone: No] [N/A:N/A] Epithelialization: [1:Small (1-33%)] [N/A:N/A] Periwound Skin Texture: [1:Excoriation: No Induration: No Callus: No Crepitus: No Rash: No Scarring: No] [N/A:N/A] Periwound Skin Moisture: [N/A:N/A] Maceration: No Dry/Scaly: No Periwound Skin Color: Atrophie Blanche: No N/A N/A Cyanosis: No Ecchymosis: No Erythema: No Hemosiderin Staining: No Mottled: No Pallor: No Rubor: No Temperature: No Abnormality N/A N/A Tenderness on Palpation: Yes N/A N/A Wound Preparation: Ulcer Cleansing: N/A N/A Rinsed/Irrigated with Saline Topical  Anesthetic Applied: Other: lidocaine 4% Treatment Notes Electronic Signature(s) Signed: 07/09/2018 11:13:25 AM By: Harold Barban Entered By: Harold Barban on 07/08/2018 16:13:03 [name], [name] ([id number]) -------------------------------------------------------------------------------- [place] Details Patient Name: [name] Date of Service: 07/08/2018 3:15 PM Medical Record Number: [id number] Patient Account Number: [id number] Date of Birth/Sex: February 23, 1961 (58 y.o. F) Treating RN: Harold Barban Primary Care Eimi Viney: Clayborn Bigness Other Clinician: Referring Kristin Barcus: Clayborn Bigness Treating Kevin Mario/Extender: Melburn Hake, HOYT Weeks in Treatment: 9 Active Inactive Necrotic Tissue Nursing Diagnoses: Impaired tissue integrity related to necrotic/devitalized tissue Goals: Necrotic/devitalized tissue will be minimized in the wound bed Date Initiated: 06/10/2018 Target Resolution Date: 06/21/2018 Goal Status: Active Interventions: Assess patient pain level pre-, during and post procedure and prior to discharge Treatment Activities: Apply topical anesthetic as ordered : 06/10/2018 Notes: Wound/Skin Impairment Nursing Diagnoses: Knowledge deficit related to ulceration/compromised skin integrity Goals: Ulcer/skin breakdown will have a volume reduction of 30% by week 4 Date Initiated: 05/06/2018 Target Resolution Date: 06/06/2018 Goal Status: Active Interventions: Assess patient/caregiver ability to obtain necessary supplies Assess patient/caregiver ability to perform ulcer/skin care regimen upon admission and as needed Assess ulceration(s) every visit Notes: Electronic Signature(s) Signed: 07/09/2018 11:13:25 AM By: Harold Barban Entered By: Harold Barban on 07/08/2018 16:12:55 [name], [name] ([id number]) -------------------------------------------------------------------------------- Pain Assessment Details Patient Name: [name] Date of Service: 07/08/2018 3:15 PM Medical Record Number: [id number] Patient Account Number: [id number] Date of Birth/Sex: 1961/01/19 (58 y.o. F) Treating RN: Secundino Ginger Primary Care Luverne Farone: Clayborn Bigness Other Clinician: Referring Kiyana Vazguez: Clayborn Bigness Treating Nadirah Socorro/Extender: Melburn Hake, HOYT Weeks in Treatment: 9 Active Problems Location of Pain Severity and Description of Pain Patient Has Paino No Site Locations Pain Management and Medication Current Pain Management: Notes pt denies any pain at this time. Electronic Signature(s) Signed: 07/08/2018 4:13:49 PM By: Secundino Ginger Entered By: Secundino Ginger on 07/08/2018 15:42:57 [name], [name] ([id number]) -------------------------------------------------------------------------------- Patient/Caregiver Education Details Patient Name: [name] Date of Service: 07/08/2018 3:15 PM Medical Record Number: [id number] Patient Account Number: [id number] Date of Birth/Gender: 12-May-1961 (58 y.o. F) Treating RN: Harold Barban Primary Care Physician: Clayborn Bigness Other Clinician: Referring Physician: Clayborn Bigness Treating Physician/Extender: Sharalyn Ink in Treatment: 9 Education Assessment Education Provided To: Patient Education Topics Provided Wound/Skin Impairment: Handouts: Caring for Your Ulcer Methods: Demonstration, Explain/Verbal Responses: State content correctly Electronic Signature(s) Signed: 07/09/2018 11:13:25 AM By: Harold Barban Entered By: Harold Barban on 07/08/2018 16:13:25 [name], [name] ([id number]) -------------------------------------------------------------------------------- Wound Assessment Details Patient Name: Raggio, [name]. Date  of Service: 07/08/2018 3:15 PM Medical Record Number: [id number] Patient Account Number: [id number] Date of Birth/Sex: 22-Sep-1960 (58 y.o. F) Treating RN: Secundino Ginger Primary Care Nykerria Macconnell: Clayborn Bigness Other Clinician: Referring Romona Murdy: Clayborn Bigness Treating Larnie Heart/Extender: Melburn Hake, HOYT Weeks in Treatment: 9 Wound Status Wound Number: 1 Primary Etiology: Open Surgical Wound Wound Location: Left Lower Leg - Medial, Proximal Wound Status: Open Wounding Event: Surgical Injury Date Acquired: 04/02/2018 Weeks Of Treatment: 9 Clustered Wound: No Wound Measurements Length: (cm) 0.1 Width: (cm) 0.1 Depth: (cm) 0.1 Area: (cm) 0.008 Volume: (cm) 0.001 % Reduction in Area: 98.9% % Reduction in Volume: 99.9% Epithelialization: Small (1-33%) Tunneling: No Undermining: No Wound Description Full Thickness Without Exposed Support Foul Od Classification: Structures Slough/ Wound Margin: Flat and Intact Exudate Small Amount: Exudate Type: Serous Exudate Color: amber or After Cleansing: No Fibrino Yes Wound Bed Granulation Amount: None Present (0%) Exposed Structure Necrotic Amount: None Present (0%) Fascia Exposed: No Fat Layer (Subcutaneous Tissue) Exposed: Yes Tendon Exposed: No Muscle Exposed: No Joint Exposed: No Bone Exposed: No Periwound Skin Texture Texture Color No Abnormalities Noted: No No Abnormalities Noted: No Callus: No Atrophie Blanche: No Crepitus: No Cyanosis: No Excoriation: No Ecchymosis: No Induration: No Erythema: No Rash: No Hemosiderin Staining: No Scarring: No Mottled: No Pallor: No Moisture Rubor: No No Abnormalities Noted: No Dry / Scaly: No Temperature / Pain Petrovich, Hadlyn L. ([id number]) Maceration: No Temperature: No Abnormality Tenderness on Palpation: Yes Wound Preparation Ulcer Cleansing: Rinsed/Irrigated with Saline Topical Anesthetic Applied: Other: lidocaine 4%, Treatment Notes Wound #1 (Left, Proximal, Medial Lower Leg) Notes Silver collagen, hydrogel and Coverlet. Electronic Signature(s) Signed: 07/08/2018 4:13:49 PM By: Secundino Ginger Entered By: Secundino Ginger on 07/08/2018 15:48:52 Kell, [name]  ([id number]) -------------------------------------------------------------------------------- [place] Details Patient Name: [name] Date of Service: 07/08/2018 3:15 PM Medical Record Number: [id number] Patient Account Number: [id number] Date of Birth/Sex: Feb 04, 1961 (58 y.o. F) Treating RN: Secundino Ginger Primary Care Ji Fairburn: Clayborn Bigness Other Clinician: Referring Gregroy Dombkowski: Clayborn Bigness Treating Nikola Marone/Extender: Melburn Hake, HOYT Weeks in Treatment: 9 Vital Signs Time Taken: 15:43 Temperature (F): 98.1 Height (in): 64 Pulse (bpm): 75 Weight (lbs): 178 Respiratory Rate (breaths/min): 16 Body Mass Index (BMI): 30.6 Blood Pressure (mmHg): 127/74 Reference Range: 80 - 120 mg / dl Electronic Signature(s) Signed: 07/08/2018 4:13:49 PM By: Secundino Ginger Entered BySecundino Ginger on 07/08/2018 15:43:26

## 2018-07-11 ENCOUNTER — Other Ambulatory Visit: Payer: Self-pay | Admitting: Internal Medicine

## 2018-07-11 DIAGNOSIS — E039 Hypothyroidism, unspecified: Secondary | ICD-10-CM

## 2018-07-22 ENCOUNTER — Ambulatory Visit: Payer: Managed Care, Other (non HMO) | Admitting: Physician Assistant

## 2018-07-23 ENCOUNTER — Encounter: Payer: Managed Care, Other (non HMO) | Attending: Physician Assistant | Admitting: Physician Assistant

## 2018-07-23 DIAGNOSIS — L97822 Non-pressure chronic ulcer of other part of left lower leg with fat layer exposed: Secondary | ICD-10-CM | POA: Insufficient documentation

## 2018-07-25 NOTE — Progress Notes (Signed)
MOREEN, PIGGOTT ([id number]) Visit Report for 07/23/2018 Arrival Information Details Patient Name: [name], [name] Date of Service: 07/23/2018 3:45 PM Medical Record Number: [id number] Patient Account Number: [id number] Date of Birth/Sex: 29-Dec-1960 (58 y.o. F) Treating RN: Army Melia Primary Care Britainy Kozub: Clayborn Bigness Other Clinician: Referring Malique Driskill: Clayborn Bigness Treating Zenobia Kuennen/Extender: Melburn Hake, HOYT Weeks in Treatment: 11 Visit Information History Since Last Visit Added or deleted any medications: No Patient Arrived: Ambulatory Any new allergies or adverse reactions: No Arrival Time: 15:56 Had a fall or experienced change in No Accompanied By: self activities of daily living that may affect Transfer Assistance: None risk of falls: Signs or symptoms of abuse/neglect since last visito No Hospitalized since last visit: No Pain Present Now: No Electronic Signature(s) Signed: 07/23/2018 4:39:27 PM By: Army Melia Entered By: Army Melia on 07/23/2018 15:57:13 Fate, [name] ([id number]) -------------------------------------------------------------------------------- Clinic Level of Care Assessment Details Patient Name: [name] Date of Service: 07/23/2018 3:45 PM Medical Record Number: [id number] Patient Account Number: [id number] Date of Birth/Sex: June 21, 1960 (58 y.o. F) Treating RN: Cornell Barman Primary Care Durward Matranga: Clayborn Bigness Other Clinician: Referring Kaydyn Sayas: Clayborn Bigness Treating Bellamy Rubey/Extender: Melburn Hake, HOYT Weeks in Treatment: 11 Clinic Level of Care Assessment Items TOOL 4 Quantity Score []  - Use when only an EandM is performed on FOLLOW-UP visit 0 ASSESSMENTS - Nursing Assessment / Reassessment []  - Reassessment of Co-morbidities (includes updates in patient status) 0 X- 1 5 Reassessment of Adherence to Treatment Plan ASSESSMENTS - Wound and Skin Assessment / Reassessment X - Simple Wound Assessment / Reassessment - one wound 1 5 []   - 0 Complex Wound Assessment / Reassessment - multiple wounds []  - 0 Dermatologic / Skin Assessment (not related to wound area) ASSESSMENTS - Focused Assessment []  - Circumferential Edema Measurements - multi extremities 0 []  - 0 Nutritional Assessment / Counseling / Intervention []  - 0 Lower Extremity Assessment (monofilament, tuning fork, pulses) []  - 0 Peripheral Arterial Disease Assessment (using hand held doppler) ASSESSMENTS - Ostomy and/or Continence Assessment and Care []  - Incontinence Assessment and Management 0 []  - 0 Ostomy Care Assessment and Management (repouching, etc.) PROCESS - Coordination of Care []  - Simple Patient / Family Education for ongoing care 0 []  - 0 Complex (extensive) Patient / Family Education for ongoing care []  - 0 Staff obtains Programmer, systems, Records, Test Results / Process Orders []  - 0 Staff telephones HHA, Nursing Homes / Clarify orders / etc []  - 0 Routine Transfer to another Facility (non-emergent condition) []  - 0 Routine Hospital Admission (non-emergent condition) []  - 0 New Admissions / Biomedical engineer / Ordering NPWT, Apligraf, etc. []  - 0 Emergency Hospital Admission (emergent condition) []  - 0 Simple Discharge Coordination Sisk, Larysa L. ([id number]) []  - 0 Complex (extensive) Discharge Coordination PROCESS - Special Needs []  - Pediatric / Minor Patient Management 0 []  - 0 Isolation Patient Management []  - 0 Hearing / Language / Visual special needs []  - 0 Assessment of Community assistance (transportation, D/C planning, etc.) []  - 0 Additional assistance / Altered mentation []  - 0 Support Surface(s) Assessment (bed, cushion, seat, etc.) INTERVENTIONS - Wound Cleansing / Measurement X - Simple Wound Cleansing - one wound 1 5 []  - 0 Complex Wound Cleansing - multiple wounds []  - 0 Wound Imaging (photographs - any number of wounds) []  - 0 Wound Tracing (instead of photographs) []  - 0 Simple Wound Measurement  - one wound []  - 0 Complex Wound Measurement - multiple wounds INTERVENTIONS - Wound Dressings []  -  Small Wound Dressing one or multiple wounds 0 []  - 0 Medium Wound Dressing one or multiple wounds []  - 0 Large Wound Dressing one or multiple wounds []  - 0 Application of Medications - topical []  - 0 Application of Medications - injection INTERVENTIONS - Miscellaneous []  - External ear exam 0 []  - 0 Specimen Collection (cultures, biopsies, blood, body fluids, etc.) []  - 0 Specimen(s) / Culture(s) sent or taken to Lab for analysis []  - 0 Patient Transfer (multiple staff / Civil Service fast streamer / Similar devices) []  - 0 Simple Staple / Suture removal (25 or less) []  - 0 Complex Staple / Suture removal (26 or more) []  - 0 Hypo / Hyperglycemic Management (close monitor of Blood Glucose) []  - 0 Ankle / Brachial Index (ABI) - do not check if billed separately X- 1 5 Vital Signs Dettmer, Emyah L. ([id number]) Has the patient been seen at the hospital within the last three years: Yes Total Score: 20 Level Of Care: New/Established - Level 1 Electronic Signature(s) Signed: 07/23/2018 6:07:40 PM By: Gretta Cool, BSN, RN, CWS, Kim RN, BSN Entered By: Gretta Cool, BSN, RN, CWS, Kim on 07/23/2018 16:35:19 Luberto, [name] ([id number]) -------------------------------------------------------------------------------- Encounter Discharge Information Details Patient Name: [name] Date of Service: 07/23/2018 3:45 PM Medical Record Number: [id number] Patient Account Number: [id number] Date of Birth/Sex: [birth year] (58 y.o. F) Treating RN: Cornell Barman Primary Care Sarah Zerby: Clayborn Bigness Other Clinician: Referring Kit Brubacher: Clayborn Bigness Treating Darrold Bezek/Extender: Melburn Hake, HOYT Weeks in Treatment: 11 Encounter Discharge Information Items Discharge Condition: Stable Ambulatory Status: Ambulatory Discharge Destination: Home Transportation: Private Auto Accompanied By: self Schedule Follow-up  Appointment: Yes Clinical Summary of Care: Electronic Signature(s) Signed: 07/23/2018 6:07:40 PM By: Gretta Cool, BSN, RN, CWS, Kim RN, BSN Entered By: Gretta Cool, BSN, RN, CWS, Kim on 07/23/2018 16:35:53 Guadron, [name] ([id number]) -------------------------------------------------------------------------------- Lower Extremity Assessment Details Patient Name: [name] Date of Service: 07/23/2018 3:45 PM Medical Record Number: [id number] Patient Account Number: [id number] Date of Birth/Sex: 1960/05/23 (57 y.o. F) Treating RN: Army Melia Primary Care Lonnie Rosado: Clayborn Bigness Other Clinician: Referring Amiyah Shryock: Clayborn Bigness Treating Nakyah Erdmann/Extender: Melburn Hake, HOYT Weeks in Treatment: 11 Edema Assessment Assessed: [Left: No] [Right: No] Edema: [Left: N] [Right: o] Electronic Signature(s) Signed: 07/23/2018 4:39:27 PM By: Army Melia Entered By: Army Melia on 07/23/2018 16:00:05 Podgurski, [name] ([id number]) -------------------------------------------------------------------------------- Multi Wound Chart Details Patient Name: [name] Date of Service: 07/23/2018 3:45 PM Medical Record Number: [id number] Patient Account Number: [id number] Date of Birth/Sex: [birth year] (58 y.o. F) Treating RN: Cornell Barman Primary Care Zahari Xiang: Clayborn Bigness Other Clinician: Referring Wendell Nicoson: Clayborn Bigness Treating Josua Ferrebee/Extender: Melburn Hake, HOYT Weeks in Treatment: 11 Vital Signs Height(in): 64 Pulse(bpm): 68 Weight(lbs): 178 Blood Pressure(mmHg): 130/72 Body Mass Index(BMI): 31 Temperature(F): 97.9 Respiratory Rate 16 (breaths/min): Photos: [N/A:N/A] Wound Location: Left, Proximal, Medial Lower N/A N/A Leg Wounding Event: Surgical Injury N/A N/A Primary Etiology: Open Surgical Wound N/A N/A Date Acquired: 04/02/2018 N/A N/A Weeks of Treatment: 11 N/A N/A Wound Status: Healed - Epithelialized N/A N/A Measurements L x W x D 0x0x0 N/A N/A (cm) Area (cm) : 0 N/A  N/A Volume (cm) : 0 N/A N/A % Reduction in Area: 100.00% N/A N/A % Reduction in Volume: 100.00% N/A N/A Classification: Full Thickness Without N/A N/A Exposed Support Structures Exudate Amount: None Present N/A N/A Wound Margin: Flat and Intact N/A N/A Granulation Amount: None Present (0%) N/A N/A Necrotic Amount: None Present (0%) N/A N/A Exposed Structures: Fat Layer (Subcutaneous N/A N/A Tissue) Exposed: Yes Fascia:  No Tendon: No Muscle: No Joint: No Bone: No Epithelialization: Small (1-33%) N/A N/A Periwound Skin Texture: Excoriation: No N/A N/A Induration: No [place], Tempest L. ([id number]) Callus: No Crepitus: No Rash: No Scarring: No Periwound Skin Moisture: Maceration: No N/A N/A Dry/Scaly: No Periwound Skin Color: Atrophie Blanche: No N/A N/A Cyanosis: No Ecchymosis: No Erythema: No Hemosiderin Staining: No Mottled: No Pallor: No Rubor: No Temperature: No Abnormality N/A N/A Tenderness on Palpation: Yes N/A N/A Wound Preparation: Ulcer Cleansing: N/A N/A Rinsed/Irrigated with Saline Topical Anesthetic Applied: None Treatment Notes Electronic Signature(s) Signed: 07/23/2018 6:07:40 PM By: Gretta Cool, BSN, RN, CWS, Kim RN, BSN Entered By: Gretta Cool, BSN, RN, CWS, Kim on 07/23/2018 16:31:05 Zupko, [name] ([id number]) -------------------------------------------------------------------------------- Multi-Disciplinary Care Plan Details Patient Name: [name] Date of Service: 07/23/2018 3:45 PM Medical Record Number: [id number] Patient Account Number: [id number] Date of Birth/Sex: 1961-04-15 (57 y.o. F) Treating RN: Cornell Barman Primary Care Hadley Detloff: Clayborn Bigness Other Clinician: Referring Lilac Hoff: Clayborn Bigness Treating Haskell Rihn/Extender: Sharalyn Ink in Treatment: 66 Active Inactive Electronic Signature(s) Signed: 07/23/2018 6:07:40 PM By: Gretta Cool, BSN, RN, CWS, Kim RN, BSN Entered By: Gretta Cool, BSN, RN, CWS, Kim on 07/23/2018 16:30:54 Kister,  [name] ([id number]) -------------------------------------------------------------------------------- Pain Assessment Details Patient Name: [name] Date of Service: 07/23/2018 3:45 PM Medical Record Number: [id number] Patient Account Number: [id number] Date of Birth/Sex: 1960-12-15 (57 y.o. F) Treating RN: Army Melia Primary Care Scherry Laverne: Clayborn Bigness Other Clinician: Referring Deniyah Dillavou: Clayborn Bigness Treating Charell Faulk/Extender: Melburn Hake, HOYT Weeks in Treatment: 11 Active Problems Location of Pain Severity and Description of Pain Patient Has Paino No Site Locations Pain Management and Medication Current Pain Management: Electronic Signature(s) Signed: 07/23/2018 4:39:27 PM By: Army Melia Entered By: Army Melia on 07/23/2018 15:57:21 Mayer, [name] ([id number]) -------------------------------------------------------------------------------- Patient/Caregiver Education Details Patient Name: [name] Date of Service: 07/23/2018 3:45 PM Medical Record Number: [id number] Patient Account Number: [id number] Date of Birth/Gender: [birth year] (58 y.o. F) Treating RN: Cornell Barman Primary Care Physician: Clayborn Bigness Other Clinician: Referring Physician: Clayborn Bigness Treating Physician/Extender: Sharalyn Ink in Treatment: 11 Education Assessment Education Provided To: Patient Education Topics Provided Engineer, maintenance) Signed: 07/23/2018 6:07:40 PM By: Gretta Cool, BSN, RN, CWS, Kim RN, BSN Entered By: Gretta Cool, BSN, RN, CWS, Kim on 07/23/2018 16:35:36 Strausbaugh, [name] ([id number]) -------------------------------------------------------------------------------- Wound Assessment Details Patient Name: [name] Date of Service: 07/23/2018 3:45 PM Medical Record Number: [id number] Patient Account Number: [id number] Date of Birth/Sex: February 17, 1961 (57 y.o. F) Treating RN: Cornell Barman Primary Care Nil Bolser: Clayborn Bigness Other Clinician: Referring  Isiah Scheel: Clayborn Bigness Treating Overton Boggus/Extender: Melburn Hake, HOYT Weeks in Treatment: 11 Wound Status Wound Number: 1 Primary Etiology: Open Surgical Wound Wound Location: Left, Proximal, Medial Lower Leg Wound Status: Healed - Epithelialized Wounding Event: Surgical Injury Date Acquired: 04/02/2018 Weeks Of Treatment: 11 Clustered Wound: No Photos Photo Uploaded By: Army Melia on 07/23/2018 16:15:02 Wound Measurements Length: (cm) 0 % Red Width: (cm) 0 % Red Depth: (cm) 0 Epith Area: (cm) 0 Tunn Volume: (cm) 0 Unde uction in Area: 100% uction in Volume: 100% elialization: Small (1-33%) eling: No rmining: No Wound Description Full Thickness Without Exposed Support Foul Classification: Structures Sloug Wound Margin: Flat and Intact Exudate None Present Amount: Odor After Cleansing: No h/Fibrino No Wound Bed Granulation Amount: None Present (0%) Exposed Structure Necrotic Amount: None Present (0%) Fascia Exposed: No Fat Layer (Subcutaneous Tissue) Exposed: Yes Tendon Exposed: No Muscle Exposed: No Joint Exposed: No Bone Exposed: No Periwound Skin Texture Texture Color  Tesch, Ariyana L. ([id number]) No Abnormalities Noted: No No Abnormalities Noted: No Callus: No Atrophie Blanche: No Crepitus: No Cyanosis: No Excoriation: No Ecchymosis: No Induration: No Erythema: No Rash: No Hemosiderin Staining: No Scarring: No Mottled: No Pallor: No Moisture Rubor: No No Abnormalities Noted: No Dry / Scaly: No Temperature / Pain Maceration: No Temperature: No Abnormality Tenderness on Palpation: Yes Wound Preparation Ulcer Cleansing: Rinsed/Irrigated with Saline Topical Anesthetic Applied: None Electronic Signature(s) Signed: 07/23/2018 6:07:40 PM By: Gretta Cool, BSN, RN, CWS, Kim RN, BSN Entered By: Gretta Cool, BSN, RN, CWS, Kim on 07/23/2018 16:30:26 Smyre, [name]  ([id number]) -------------------------------------------------------------------------------- Vitals Details Patient Name: [name] Date of Service: 07/23/2018 3:45 PM Medical Record Number: [id number] Patient Account Number: [id number] Date of Birth/Sex: 1960-11-18 (58 y.o. F) Treating RN: Army Melia Primary Care Jazari Ober: Clayborn Bigness Other Clinician: Referring Dodi Leu: Clayborn Bigness Treating Senovia Gauer/Extender: Melburn Hake, HOYT Weeks in Treatment: 11 Vital Signs Time Taken: 15:57 Temperature (F): 97.9 Height (in): 64 Pulse (bpm): 68 Weight (lbs): 178 Respiratory Rate (breaths/min): 16 Body Mass Index (BMI): 30.6 Blood Pressure (mmHg): 130/72 Reference Range: 80 - 120 mg / dl Electronic Signature(s) Signed: 07/23/2018 4:39:27 PM By: Army Melia Entered By: Army Melia on 07/23/2018 15:59:23

## 2018-07-25 NOTE — Progress Notes (Signed)
AMIA, RYNDERS ([id number]) Visit Report for 07/23/2018 Chief Complaint Document Details Patient Name: [name], [name] Date of Service: 07/23/2018 3:45 PM Medical Record Number: [id number] Patient Account Number: [id number] Date of Birth/Sex: [birth year] (58 y.o. F) Treating RN: Cornell Barman Primary Care Provider: Clayborn Bigness Other Clinician: Referring Provider: Clayborn Bigness Treating Provider/Extender: Melburn Hake, Keslyn Teater Weeks in Treatment: 11 Information Obtained from: Patient Chief Complaint Left lower leg surgical ulcer Electronic Signature(s) Signed: 07/24/2018 1:14:32 PM By: Worthy Keeler PA-C Entered By: Worthy Keeler on 07/23/2018 15:45:24 [name], [name] ([id number]) -------------------------------------------------------------------------------- HPI Details Patient Name: [name] Date of Service: 07/23/2018 3:45 PM Medical Record Number: [id number] Patient Account Number: [id number] Date of Birth/Sex: 1960-06-24 (57 y.o. F) Treating RN: Cornell Barman Primary Care Provider: Clayborn Bigness Other Clinician: Referring Provider: Clayborn Bigness Treating Provider/Extender: Melburn Hake, Sonia Stickels Weeks in Treatment: 11 History of Present Illness HPI Description: 05/06/18 on evaluation today patient presents for initial inspection in our clinic concerning an issue that she has been having on her lower extremity as result of a initially biopsy and then subsequently excision of a precancerous lesion which was undertaken by dermatology on 04/02/18. I do not have the pathology report available today for my valuation unfortunately. Nonetheless we are gonna work on getting this from Dr. Elveria Rising office. The patient states that since this was removed and was quite deep sutures were attempted internally to help pull things together and then the area was pulled together utilizing Steri-Strips. Unfortunately it appears that the suture closure but Steri-Strips did not taken the patient is currently  having issues with a dehisced wound where there is significant slough buildup internally. Nonetheless I do believe that this is something that may benefit from sharp debridement and good wound care at this time. Fortunately there is no sign of infection currently. No fevers, chills, nausea, or vomiting noted at this time. The patient is here is a referral from her primary care provider. 05/20/18 on evaluation today patient actually appears to be doing rather well in regard to her lower extremity ulcer. The area of skin that was connecting the small opening to the larger main opening is actually for me now as I expected it likely would. This likely means that we're gonna need to go ahead and see about trimming this area off which I think is the most perfect and help this area to heal appropriately. The patient is in agreement with this plan today. Other than that there's no sign of infection she seems to be doing very well. 05/27/18 on evaluation today patient actually appears to be doing very well in regard to her leg ulcer. The good news is we did get approval for the Snap Vac. With that being said she seems to be doing so well I'm not even certain that this is going to become necessary. She's not quite sure she would want to proceed with this anyway just due to the fact that it would be somewhat more cumbersome than just utilizing the dressing itself. Nonetheless that is definitely an option we discussed that further during the visit. She seems to be healing quite nicely. 06/03/18 on evaluation today patient appears to be doing well in regard to her lower extremity ulcer. She has been tolerating the dressing changes without complication. Fortunately there is no sign of infection she did have some Slough noted on the surface of the wound today. 06/10/18 on evaluation today patient's wound actually appears to be showing some signs of infection unfortunately.  She's also having a little bit of increased  pain which has me somewhat concerned as well. No fevers, chills, nausea, or vomiting noted at this time. 06/17/18 on evaluation today patient actually appears to be doing very well in regard to her lower Trinity ulcer. In fact this seems to be showing signs of excellent improvement when compared to last week she's not having the pain she was having last week and overall this is excellent news. I'm very pleased with how things have progressed. With that being said I do think that we may just want to keep with the and about equipment at this point since she has done so well I did add support was well attended last week for her based on the culture results. It did show that she had a Pseudomonas infection as well. 06/24/18 on evaluation today patient appears to be doing excellent in regard to her left lower extremity ulcer. She has been tolerating the dressing changes without complication. Fortunately she seems to have made excellent progress even since last week when I saw her. Overall I'm very happy with the way things have been going. 07/08/18 on evaluation today patient appears to be doing rather well in regard to her ulcer although she was hoping this was completely healed but still very small opening still remaining at this point. Fortunately there's no signs of infection which is maintenance. No fevers, chills, nausea, or vomiting noted at this time. 07/23/18 on evaluation today patient actually appears to be doing much better in regard to her ulcer in fact this appears to be completely healed which is excellent news. Overall very pleased with how things appear currently. She's having no pain. [name], [name] ([id number]) Electronic Signature(s) Signed: 07/24/2018 1:14:32 PM By: Worthy Keeler PA-C Entered By: Worthy Keeler on 07/23/2018 16:54:50 [name], [name] ([id number]) -------------------------------------------------------------------------------- Physical Exam Details Patient Name:  [name] Date of Service: 07/23/2018 3:45 PM Medical Record Number: [id number] Patient Account Number: [id number] Date of Birth/Sex: 1960-10-06 (58 y.o. F) Treating RN: Cornell Barman Primary Care Provider: Clayborn Bigness Other Clinician: Referring Provider: Clayborn Bigness Treating Provider/Extender: Melburn Hake, Arie Powell Weeks in Treatment: 58 Constitutional Well-nourished and well-hydrated in no acute distress. Respiratory normal breathing without difficulty. Psychiatric this patient is able to make decisions and demonstrates good insight into disease process. Alert and Oriented x 3. pleasant and cooperative. Notes On inspection today patient's wound bed shows evidence of excellent granulation at this point there is no sign of infection or even opening currently I'm very pleased in this regard everything has done excellent. Electronic Signature(s) Signed: 07/24/2018 1:14:32 PM By: Worthy Keeler PA-C Entered By: Worthy Keeler on 07/23/2018 16:55:30 [name], [name] ([id number]) -------------------------------------------------------------------------------- Physician Orders Details Patient Name: [name] Date of Service: 07/23/2018 3:45 PM Medical Record Number: [id number] Patient Account Number: [id number] Date of Birth/Sex: [birth year] (58 y.o. F) Treating RN: Cornell Barman Primary Care Provider: Clayborn Bigness Other Clinician: Referring Provider: Clayborn Bigness Treating Provider/Extender: Melburn Hake, Lennyn Gange Weeks in Treatment: 46 Verbal / Phone Orders: No Diagnosis Coding ICD-10 Coding Code Description T81.31XA Disruption of external operation (surgical) wound, not elsewhere classified, initial encounter L97.822 Non-pressure chronic ulcer of other part of left lower leg with fat layer exposed Discharge From [employer] o Discharge from [facility] treatment complete Electronic Signature(s) Signed: 07/23/2018 6:07:40 PM By: Gretta Cool, BSN, RN, CWS, Kim RN, BSN Signed:  07/24/2018 1:14:32 PM By: Worthy Keeler PA-C Entered By: Gretta Cool, BSN, RN,  CWS, Kim on 07/23/2018 16:31:36 [name], [name] ([id number]) -------------------------------------------------------------------------------- Problem List Details Patient Name: [name], [name] Date of Service: 07/23/2018 3:45 PM Medical Record Number: [id number] Patient Account Number: [id number] Date of Birth/Sex: 12-07-1960 (58 y.o. F) Treating RN: Cornell Barman Primary Care Provider: Clayborn Bigness Other Clinician: Referring Provider: Clayborn Bigness Treating Provider/Extender: Melburn Hake, Din Bookwalter Weeks in Treatment: 11 Active Problems ICD-10 Evaluated Encounter Code Description Active Date Today Diagnosis T81.31XA Disruption of external operation (surgical) wound, not 05/06/2018 No Yes elsewhere classified, initial encounter L97.822 Non-pressure chronic ulcer of other part of left lower leg with 05/06/2018 No Yes fat layer exposed Inactive Problems Resolved Problems Electronic Signature(s) Signed: 07/24/2018 1:14:32 PM By: Worthy Keeler PA-C Entered By: Worthy Keeler on 07/23/2018 15:45:20 [name], [name] ([id number]) -------------------------------------------------------------------------------- Progress Note Details Patient Name: [name] Date of Service: 07/23/2018 3:45 PM Medical Record Number: [id number] Patient Account Number: [id number] Date of Birth/Sex: 12-Sep-1960 (57 y.o. F) Treating RN: Cornell Barman Primary Care Provider: Clayborn Bigness Other Clinician: Referring Provider: Clayborn Bigness Treating Provider/Extender: Melburn Hake, Annabel Gibeau Weeks in Treatment: 11 Subjective Chief Complaint Information obtained from Patient Left lower leg surgical ulcer History of Present Illness (HPI) 05/06/18 on evaluation today patient presents for initial inspection in our clinic concerning an issue that she has been having on her lower extremity as result of a initially biopsy and then subsequently excision of a  precancerous lesion which was undertaken by dermatology on 04/02/18. I do not have the pathology report available today for my valuation unfortunately. Nonetheless we are gonna work on getting this from Dr. Elveria Rising office. The patient states that since this was removed and was quite deep sutures were attempted internally to help pull things together and then the area was pulled together utilizing Steri-Strips. Unfortunately it appears that the suture closure but Steri-Strips did not taken the patient is currently having issues with a dehisced wound where there is significant slough buildup internally. Nonetheless I do believe that this is something that may benefit from sharp debridement and good wound care at this time. Fortunately there is no sign of infection currently. No fevers, chills, nausea, or vomiting noted at this time. The patient is here is a referral from her primary care provider. 05/20/18 on evaluation today patient actually appears to be doing rather well in regard to her lower extremity ulcer. The area of skin that was connecting the small opening to the larger main opening is actually for me now as I expected it likely would. This likely means that we're gonna need to go ahead and see about trimming this area off which I think is the most perfect and help this area to heal appropriately. The patient is in agreement with this plan today. Other than that there's no sign of infection she seems to be doing very well. 05/27/18 on evaluation today patient actually appears to be doing very well in regard to her leg ulcer. The good news is we did get approval for the Snap Vac. With that being said she seems to be doing so well I'm not even certain that this is going to become necessary. She's not quite sure she would want to proceed with this anyway just due to the fact that it would be somewhat more cumbersome than just utilizing the dressing itself. Nonetheless that is definitely an option  we discussed that further during the visit. She seems to be healing quite nicely. 06/03/18 on evaluation today patient appears to be doing well in  regard to her lower extremity ulcer. She has been tolerating the dressing changes without complication. Fortunately there is no sign of infection she did have some Slough noted on the surface of the wound today. 06/10/18 on evaluation today patient's wound actually appears to be showing some signs of infection unfortunately. She's also having a little bit of increased pain which has me somewhat concerned as well. No fevers, chills, nausea, or vomiting noted at this time. 06/17/18 on evaluation today patient actually appears to be doing very well in regard to her lower Trinity ulcer. In fact this seems to be showing signs of excellent improvement when compared to last week she's not having the pain she was having last week and overall this is excellent news. I'm very pleased with how things have progressed. With that being said I do think that we may just want to keep with the and about equipment at this point since she has done so well I did add support was well attended last week for her based on the culture results. It did show that she had a Pseudomonas infection as well. 06/24/18 on evaluation today patient appears to be doing excellent in regard to her left lower extremity ulcer. She has been tolerating the dressing changes without complication. Fortunately she seems to have made excellent progress even since last week when I saw her. Overall I'm very happy with the way things have been going. 07/08/18 on evaluation today patient appears to be doing rather well in regard to her ulcer although she was hoping this was [place], [place] ([id number]) completely healed but still very small opening still remaining at this point. Fortunately there's no signs of infection which is maintenance. No fevers, chills, nausea, or vomiting noted at this time. 07/23/18  on evaluation today patient actually appears to be doing much better in regard to her ulcer in fact this appears to be completely healed which is excellent news. Overall very pleased with how things appear currently. She's having no pain. Patient History Information obtained from Patient. Family History Heart Disease - Father,Paternal Grandparents, Hypertension - Father,Paternal Grandparents, No family history of Cancer, Diabetes, Hereditary Spherocytosis, Kidney Disease, Lung Disease, Seizures, Stroke, Thyroid Problems, Tuberculosis. Social History Never smoker, Marital Status - Married, Alcohol Use - Rarely, Drug Use - No History, Caffeine Use - Daily. Medical History Eyes Denies history of Cataracts, Glaucoma, Optic Neuritis Ear/Nose/Mouth/Throat Denies history of Chronic sinus problems/congestion, Middle ear problems Hematologic/Lymphatic Denies history of Anemia, Hemophilia, Human Immunodeficiency Virus, Lymphedema, Sickle Cell Disease Respiratory Denies history of Aspiration, Asthma, Chronic Obstructive Pulmonary Disease (COPD), Pneumothorax, Sleep Apnea, Tuberculosis Cardiovascular Denies history of Angina, Arrhythmia, Congestive Heart Failure, Coronary Artery Disease, Deep Vein Thrombosis, Hypertension, Hypotension, Myocardial Infarction, Peripheral Arterial Disease, Peripheral Venous Disease, Phlebitis, Vasculitis Gastrointestinal Denies history of Cirrhosis , Colitis, Crohn s, Hepatitis A, Hepatitis B, Hepatitis C Endocrine Denies history of Type I Diabetes, Type II Diabetes Genitourinary Denies history of End Stage Renal Disease Immunological Denies history of Lupus Erythematosus, Raynaud s, Scleroderma Integumentary (Skin) Denies history of History of Burn, History of pressure wounds Musculoskeletal Denies history of Gout, Rheumatoid Arthritis, Osteoarthritis, Osteomyelitis Neurologic Denies history of Dementia, Neuropathy, Quadriplegia, Paraplegia, Seizure  Disorder Oncologic Denies history of Received Chemotherapy, Received Radiation Psychiatric Denies history of Anorexia/bulimia, Confinement Anxiety Medical And Surgical History Notes Cardiovascular laser vein treatments and scleratherapy 2019 Musculoskeletal psoriasis Review of Systems (ROS) Constitutional Symptoms (General Health) Denies complaints or symptoms of Fever, Chills. [name], [name] ([id number]) Respiratory The patient has  no complaints or symptoms. Psychiatric The patient has no complaints or symptoms. Objective Constitutional Well-nourished and well-hydrated in no acute distress. Vitals Time Taken: 3:57 PM, Height: 64 in, Weight: 178 lbs, BMI: 30.6, Temperature: 97.9 F, Pulse: 68 bpm, Respiratory Rate: 16 breaths/min, Blood Pressure: 130/72 mmHg. Respiratory normal breathing without difficulty. Psychiatric this patient is able to make decisions and demonstrates good insight into disease process. Alert and Oriented x 3. pleasant and cooperative. General Notes: On inspection today patient's wound bed shows evidence of excellent granulation at this point there is no sign of infection or even opening currently I'm very pleased in this regard everything has done excellent. Integumentary (Hair, Skin) Wound #1 status is Healed - Epithelialized. Original cause of wound was Surgical Injury. The wound is located on the Left,Proximal,Medial Lower Leg. The wound measures 0cm length x 0cm width x 0cm depth; 0cm^2 area and 0cm^3 volume. There is Fat Layer (Subcutaneous Tissue) Exposed exposed. There is no tunneling or undermining noted. There is a none present amount of drainage noted. The wound margin is flat and intact. There is no granulation within the wound bed. There is no necrotic tissue within the wound bed. The periwound skin appearance did not exhibit: Callus, Crepitus, Excoriation, Induration, Rash, Scarring, Dry/Scaly, Maceration, Atrophie Blanche, Cyanosis,  Ecchymosis, Hemosiderin Staining, Mottled, Pallor, Rubor, Erythema. Periwound temperature was noted as No Abnormality. The periwound has tenderness on palpation. Assessment Active Problems ICD-10 Disruption of external operation (surgical) wound, not elsewhere classified, initial encounter Non-pressure chronic ulcer of other part of left lower leg with fat layer exposed [name], [name] ([id number]) Plan Discharge From [facility]: Discharge from [place] treatment complete I'm gonna discontinue wound or services at this point we will see her back for an as needed follow-up appointment in future if anything changes worsens. Otherwise I'm glad this has healed so well. Electronic Signature(s) Signed: 07/24/2018 1:14:32 PM By: Worthy Keeler PA-C Entered By: Worthy Keeler on 07/23/2018 16:56:10 Grochowski, [name] ([id number]) -------------------------------------------------------------------------------- ROS/PFSH Details Patient Name: [name] Date of Service: 07/23/2018 3:45 PM Medical Record Number: [id number] Patient Account Number: [id number] Date of Birth/Sex: April 06, 1961 (58 y.o. F) Treating RN: Cornell Barman Primary Care Provider: Clayborn Bigness Other Clinician: Referring Provider: Clayborn Bigness Treating Provider/Extender: Melburn Hake, Kelena Garrow Weeks in Treatment: 11 Information Obtained From Patient Wound History Do you currently have one or more open woundso Yes How many open wounds do you currently haveo 1 Approximately how long have you had your woundso 5 weeks How have you been treating your wound(s) until nowo open to air Has your wound(s) ever healed and then re-openedo No Have you had any lab work done in the past montho No Have you tested positive for an antibiotic resistant organism (MRSA, VRE)o No Have you tested positive for osteomyelitis (bone infection)o No Have you had any tests for circulation on your legso No Constitutional Symptoms (General  Health) Complaints and Symptoms: Negative for: Fever; Chills Eyes Medical History: Negative for: Cataracts; Glaucoma; Optic Neuritis Ear/Nose/Mouth/Throat Medical History: Negative for: Chronic sinus problems/congestion; Middle ear problems Hematologic/Lymphatic Medical History: Negative for: Anemia; Hemophilia; Human Immunodeficiency Virus; Lymphedema; Sickle Cell Disease Respiratory Complaints and Symptoms: No Complaints or Symptoms Medical History: Negative for: Aspiration; Asthma; Chronic Obstructive Pulmonary Disease (COPD); Pneumothorax; Sleep Apnea; Tuberculosis Cardiovascular Medical History: Negative for: Angina; Arrhythmia; Congestive Heart Failure; Coronary Artery Disease; Deep Vein Thrombosis; Hypertension; Hypotension; Myocardial Infarction; Peripheral Arterial Disease; Peripheral Venous Disease; Phlebitis; Vasculitis Past Medical History Notes: Rather,  Stacey L. ([id number]) laser vein treatments and scleratherapy 2019 Gastrointestinal Medical History: Negative for: Cirrhosis ; Colitis; Crohnos; Hepatitis A; Hepatitis B; Hepatitis C Endocrine Medical History: Negative for: Type I Diabetes; Type II Diabetes Genitourinary Medical History: Negative for: End Stage Renal Disease Immunological Medical History: Negative for: Lupus Erythematosus; Raynaudos; Scleroderma Integumentary (Skin) Medical History: Negative for: History of Burn; History of pressure wounds Musculoskeletal Medical History: Negative for: Gout; Rheumatoid Arthritis; Osteoarthritis; Osteomyelitis Past Medical History Notes: psoriasis Neurologic Medical History: Negative for: Dementia; Neuropathy; Quadriplegia; Paraplegia; Seizure Disorder Oncologic Medical History: Negative for: Received Chemotherapy; Received Radiation Psychiatric Complaints and Symptoms: No Complaints or Symptoms Medical History: Negative for: Anorexia/bulimia; Confinement Anxiety Immunizations Pneumococcal  Vaccine: Received Pneumococcal Vaccination: No Immunization Notes: up to date Implantable Devices [name], Stacey L. ([id number]) No devices added Family and Social History Cancer: No; Diabetes: No; Heart Disease: Yes - Father,Paternal Grandparents; Hereditary Spherocytosis: No; Hypertension: Yes - Father,Paternal Grandparents; Kidney Disease: No; Lung Disease: No; Seizures: No; Stroke: No; Thyroid Problems: No; Tuberculosis: No; Never smoker; Marital Status - Married; Alcohol Use: Rarely; Drug Use: No History; Caffeine Use: Daily; Financial Concerns: No; Food, Clothing or Shelter Needs: No; Support System Lacking: No; Transportation Concerns: No; Advanced Directives: No; Patient does not want information on Advanced Directives Physician Affirmation I have reviewed and agree with the above information. Electronic Signature(s) Signed: 07/23/2018 6:07:40 PM By: Gretta Cool, BSN, RN, CWS, Kim RN, BSN Signed: 07/24/2018 1:14:32 PM By: Worthy Keeler PA-C Entered By: Worthy Keeler on 07/23/2018 16:55:06 [place], [name] ([id number]) -------------------------------------------------------------------------------- SuperBill Details Patient Name: [name] Date of Service: 07/23/2018 Medical Record Number: [id number] Patient Account Number: [id number] Date of Birth/Sex: Aug 15, 1960 (58 y.o. F) Treating RN: Cornell Barman Primary Care Provider: Clayborn Bigness Other Clinician: Referring Provider: Clayborn Bigness Treating Provider/Extender: Melburn Hake, Latayna Ritchie Weeks in Treatment: 11 Diagnosis Coding ICD-10 Codes Code Description T81.31XA Disruption of external operation (surgical) wound, not elsewhere classified, initial encounter L97.822 Non-pressure chronic ulcer of other part of left lower leg with fat layer exposed Facility Procedures CPT4 Code: [id number] Description: [id number] - WOUND CARE VISIT-LEV 1 EST PT Modifier: Quantity: 1 Physician Procedures CPT4: Description Modifier Quantity Code [id number]  [id number] - WC PHYS LEVEL 2 - EST PT 1 ICD-10 Diagnosis Description T81.31XA Disruption of external operation (surgical) wound, not elsewhere classified, initial encounter W25.852 Non-pressure chronic ulcer of  other part of left lower leg with fat layer exposed Electronic Signature(s) Signed: 07/24/2018 1:14:32 PM By: Worthy Keeler PA-C Entered By: Worthy Keeler on 07/23/2018 16:56:23

## 2018-08-13 ENCOUNTER — Ambulatory Visit (INDEPENDENT_AMBULATORY_CARE_PROVIDER_SITE_OTHER): Payer: Managed Care, Other (non HMO) | Admitting: Internal Medicine

## 2018-08-13 ENCOUNTER — Other Ambulatory Visit: Payer: Self-pay

## 2018-08-13 DIAGNOSIS — Z1239 Encounter for other screening for malignant neoplasm of breast: Secondary | ICD-10-CM | POA: Diagnosis not present

## 2018-08-13 DIAGNOSIS — R300 Dysuria: Secondary | ICD-10-CM

## 2018-08-13 DIAGNOSIS — I10 Essential (primary) hypertension: Secondary | ICD-10-CM | POA: Diagnosis not present

## 2018-08-13 DIAGNOSIS — M25572 Pain in left ankle and joints of left foot: Secondary | ICD-10-CM

## 2018-08-13 DIAGNOSIS — M2550 Pain in unspecified joint: Secondary | ICD-10-CM | POA: Diagnosis not present

## 2018-08-13 DIAGNOSIS — Z0001 Encounter for general adult medical examination with abnormal findings: Secondary | ICD-10-CM

## 2018-08-13 DIAGNOSIS — M25571 Pain in right ankle and joints of right foot: Secondary | ICD-10-CM | POA: Insufficient documentation

## 2018-08-13 NOTE — Progress Notes (Signed)
[facility] [place], [place] [address]  Internal MEDICINE  Office Visit Note  Patient Name: [name]  [id number]  [id number]  Date of Service: 08/15/2018  Chief Complaint  Patient presents with  . Annual Exam  . Hypertension  . Hypothyroidism  . Open Wound    healed after 11 weeks at wound center    HPI Pt is here for routine health maintenance examination. Overall feels better, Left leg wound is well healed now, was with wound care for couple of months. She is not happy with her weight. She is trying to work on it.   Current Medication: Outpatient Encounter Medications as of 08/13/2018  Medication Sig  . fish oil-omega-3 fatty acids 1000 MG capsule Take 2 g by mouth daily.  [name] levothyroxine (SYNTHROID, LEVOTHROID) 88 MCG tablet TAKE 1 TABLET BY MOUTH DAILY  . Probiotic Product (TRUBIOTICS PO) Take by mouth daily.  [name] rOPINIRole (REQUIP) 0.5 MG tablet TAKE 1 TABLET BY MOUTH EVERY NIGHT AT BEDTIME FOR RESTLESS LEGS  . valsartan (DIOVAN) 80 MG tablet Take 1 tablet (80 mg total) by mouth daily.  [name] aspirin EC 81 MG tablet Take 81 mg by mouth daily.  [name] (EUCRISA) 2 % OINT Apply topically 2 (two) times daily.  [name] triamcinolone cream (KENALOG) 0.1 % Apply 1 application topically 2 (two) times daily.  [name] triamterene-hydrochlorothiazide (MAXZIDE-25) 37.5-25 MG tablet Take 1 tablet by mouth daily. (Patient not taking: Reported on 08/13/2018)  . [DISCONTINUED] Fe Cbn-Fe Gluc-FA-B12-C-DSS (FERRALET 90 PO) Take by mouth daily.  . [DISCONTINUED] Wheat Dextrin (BENEFIBER) POWD Take by mouth 2 (two) times daily.   No facility-administered encounter medications on file as of 08/13/2018.     Surgical History: Past Surgical History:  Procedure Laterality Date  . ABDOMINAL HYSTERECTOMY  2007   endometriosis, [name]  . VASCULAR SURGERY Left    left leg    Medical History: Past Medical History:  Diagnosis Date  . Back pain    spinal  stenosis  . Psoriasis    [name]  . Spinal stenosis   . Thyroid disease     Family History: Family History  Problem Relation Age of Onset  . Heart disease Father   . Diabetes Paternal Uncle   . Breast cancer Neg Hx    Review of Systems  Constitutional: Negative for chills, diaphoresis and fatigue.  HENT: Negative for ear pain, postnasal drip and sinus pressure.   Eyes: Negative for photophobia, discharge, redness, itching and visual disturbance.  Respiratory: Negative for cough, shortness of breath and wheezing.   Cardiovascular: Negative for chest pain, palpitations and leg swelling.  Gastrointestinal: Negative for abdominal pain, constipation, diarrhea, nausea and vomiting.  Genitourinary: Negative for dysuria and flank pain.  Musculoskeletal: Negative for arthralgias, back pain, gait problem and neck pain.  Skin: Negative for color change.  Allergic/Immunologic: Negative for environmental allergies and food allergies.  Neurological: Negative for dizziness and headaches.  Hematological: Does not bruise/bleed easily.  Psychiatric/Behavioral: Negative for agitation, behavioral problems (depression) and hallucinations.    Vital Signs: BP (!) 128/92 (BP Location: Left Arm, Patient Position: Sitting, Cuff Size: Normal)   Pulse 66   Resp 16   Ht 5' 4" (1.626 m)   Wt 183 lb (83 kg)   SpO2 99%   BMI 31.41 kg/m    Physical Exam Constitutional:      General: She is not in acute distress.    Appearance: She is well-developed. She is not  diaphoretic.  HENT:     Head: Normocephalic and atraumatic.     Mouth/Throat:     Pharynx: No oropharyngeal exudate.  Eyes:     Pupils: Pupils are equal, round, and reactive to light.  Neck:     Musculoskeletal: Normal range of motion and neck supple.     Thyroid: No thyromegaly.     Vascular: No JVD.     Trachea: No tracheal deviation.  Cardiovascular:     Rate and Rhythm: Normal rate and regular rhythm.     Heart sounds: Normal  heart sounds. No murmur. No friction rub. No gallop.   Pulmonary:     Effort: Pulmonary effort is normal. No respiratory distress.     Breath sounds: No wheezing or rales.  Chest:     Chest wall: No tenderness.  Abdominal:     General: Bowel sounds are normal.     Palpations: Abdomen is soft.  Musculoskeletal: Normal range of motion.  Lymphadenopathy:     Cervical: No cervical adenopathy.  Skin:    General: Skin is warm and dry.  Neurological:     Mental Status: She is alert and oriented to person, place, and time.     Cranial Nerves: No cranial nerve deficit.  Psychiatric:        Behavior: Behavior normal.        Thought Content: Thought content normal.        Judgment: Judgment normal.    LABS: Recent Results (from the past 2160 hour(s))  Aerobic/Anaerobic Culture (surgical/deep wound)     Status: None   Collection Time: 06/10/18 11:40 AM  Result Value Ref Range   Specimen Description      WOUND Performed at [facility], [address], [place], [place] [address]    Special Requests      NONE Performed at [facility], [address] [place], [place] [address]    Gram Stain      NO WBC SEEN NO ORGANISMS SEEN Performed at [facility], [place] [address], [place], [place] [address]    Culture      RARE STAPHYLOCOCCUS AUREUS RARE PSEUDOMONAS AERUGINOSA NO ANAEROBES ISOLATED    Report Status 06/16/2018 FINAL    Organism ID, Bacteria STAPHYLOCOCCUS AUREUS    Organism ID, Bacteria PSEUDOMONAS AERUGINOSA       Susceptibility   Pseudomonas aeruginosa - MIC*    CEFTAZIDIME 4 SENSITIVE Sensitive     CIPROFLOXACIN <=0.25 SENSITIVE Sensitive     GENTAMICIN <=1 SENSITIVE Sensitive     IMIPENEM 1 SENSITIVE Sensitive     PIP/TAZO <=4 SENSITIVE Sensitive     CEFEPIME 2 SENSITIVE Sensitive     * RARE PSEUDOMONAS AERUGINOSA   Staphylococcus aureus - MIC*    CIPROFLOXACIN <=0.5 SENSITIVE Sensitive     ERYTHROMYCIN >=8 RESISTANT Resistant      GENTAMICIN <=0.5 SENSITIVE Sensitive     OXACILLIN 0.5 SENSITIVE Sensitive     TETRACYCLINE <=1 SENSITIVE Sensitive     VANCOMYCIN <=0.5 SENSITIVE Sensitive     TRIMETH/SULFA <=10 SENSITIVE Sensitive     CLINDAMYCIN RESISTANT Resistant     RIFAMPIN <=0.5 SENSITIVE Sensitive     Inducible Clindamycin POSITIVE Resistant     * RARE STAPHYLOCOCCUS AUREUS  UA/M w/rflx Culture, Routine     Status: Abnormal   Collection Time: 08/13/18  9:43 AM  Result Value Ref Range   Specific Gravity, UA      <=1.005 (A) 1.005 - 1.030  pH, UA 7.5 5.0 - 7.5   Color, UA Yellow Yellow   Appearance Ur Clear Clear   Leukocytes,UA Negative Negative   Protein,UA Negative Negative/Trace   Glucose, UA Negative Negative   Ketones, UA Negative Negative   RBC, UA Negative Negative   Bilirubin, UA Negative Negative   Urobilinogen, Ur 0.2 0.2 - 1.0 mg/dL   Nitrite, UA Negative Negative   Microscopic Examination Comment     Comment: Microscopic follows if indicated.   Microscopic Examination See below:     Comment: Microscopic was indicated and was performed.   Urinalysis Reflex Comment     Comment: This specimen will not reflex to a Urine Culture.  Microscopic Examination     Status: None   Collection Time: 08/13/18  9:43 AM  Result Value Ref Range   WBC, UA 0-5 0 - 5 /hpf   RBC None seen 0 - 2 /hpf   Epithelial Cells (non renal) 0-10 0 - 10 /hpf   Casts None seen None seen /lpf   Mucus, UA Present Not Estab.   Bacteria, UA None seen None seen/Few   Assessment/Plan: 1. Encounter for general adult medical examination with abnormal findings - Pt is up to date on her PHM  2. Essential hypertension, benign - Continue to monitor   3. Screening breast examination - MM DIGITAL SCREENING BILATERAL; Future  4. Arthralgia, unspecified joint - Sed Rate (ESR) - Uric acid - Rheumatoid Factor - ANA w/Reflex if Positive; Future - CBC with Differential/Platelet - Lipid Panel With LDL/HDL Ratio - TSH - T4,  free - Comprehensive metabolic panel  5. Dysuria - UA/M w/rflx Culture, Routine l  General Counseling: Shaniqua verbalizes understanding of the findings of todays visit and agrees with plan of treatment. I have discussed any further diagnostic evaluation that may be needed or ordered today. We also reviewed her medications today. she has been encouraged to call the office with any questions or concerns that should arise related to todays visit  Orders Placed This Encounter  Procedures  . Microscopic Examination  . MM DIGITAL SCREENING BILATERAL  . UA/M w/rflx Culture, Routine  . Sed Rate (ESR)  . Uric acid  . Rheumatoid Factor  . ANA w/Reflex if Positive  . CBC with Differential/Platelet  . Lipid Panel With LDL/HDL Ratio  . TSH  . T4, free  . Comprehensive metabolic panel     Time HWEXH:37 [place], MD  Internal Medicine

## 2018-08-14 LAB — MICROSCOPIC EXAMINATION
Bacteria, UA: NONE SEEN
Casts: NONE SEEN /lpf
RBC, Urine: NONE SEEN /hpf (ref 0–2)

## 2018-08-14 LAB — UA/M W/RFLX CULTURE, ROUTINE
BILIRUBIN UA: NEGATIVE
GLUCOSE, UA: NEGATIVE
KETONES UA: NEGATIVE
Leukocytes,UA: NEGATIVE
Nitrite, UA: NEGATIVE
Protein,UA: NEGATIVE
RBC, UA: NEGATIVE
Specific Gravity, UA: <=1.005 — AB (ref 1.005–1.030)
UUROB: 0.2 mg/dL (ref 0.2–1.0)
pH, UA: 7.5 (ref 5.0–7.5)

## 2018-10-01 LAB — SEDIMENTATION RATE: Sed Rate: 2 mm/hr (ref 0–40)

## 2018-10-02 LAB — CBC WITH DIFFERENTIAL/PLATELET
Basophils Absolute: 0 10*3/uL (ref 0.0–0.2)
Basos: 0 %
EOS (ABSOLUTE): 0.1 10*3/uL (ref 0.0–0.4)
EOS: 3 %
HEMATOCRIT: 37.8 % (ref 34.0–46.6)
HEMOGLOBIN: 13.5 g/dL (ref 11.1–15.9)
Immature Grans (Abs): 0 10*3/uL (ref 0.0–0.1)
Immature Granulocytes: 0 %
LYMPHS ABS: 1.4 10*3/uL (ref 0.7–3.1)
Lymphs: 34 %
MCH: 32.4 pg (ref 26.6–33.0)
MCHC: 35.7 g/dL (ref 31.5–35.7)
MCV: 91 fL (ref 79–97)
Monocytes Absolute: 0.5 10*3/uL (ref 0.1–0.9)
Monocytes: 12 %
Neutrophils Absolute: 2.1 10*3/uL (ref 1.4–7.0)
Neutrophils: 51 %
Platelets: 275 10*3/uL (ref 150–450)
RBC: 4.17 x10E6/uL (ref 3.77–5.28)
RDW: 11.7 % (ref 11.7–15.4)
WBC: 4.1 10*3/uL (ref 3.4–10.8)

## 2018-10-02 LAB — COMPREHENSIVE METABOLIC PANEL
ALT: 17 IU/L (ref 0–32)
AST: 14 IU/L (ref 0–40)
Albumin/Globulin Ratio: 2 (ref 1.2–2.2)
Albumin: 4.5 g/dL (ref 3.8–4.9)
Alkaline Phosphatase: 92 IU/L (ref 39–117)
BILIRUBIN TOTAL: 0.3 mg/dL (ref 0.0–1.2)
BUN/Creatinine Ratio: 18 (ref 9–23)
BUN: 16 mg/dL (ref 6–24)
CHLORIDE: 101 mmol/L (ref 96–106)
CO2: 24 mmol/L (ref 20–29)
Calcium: 9.6 mg/dL (ref 8.7–10.2)
Creatinine, Ser: 0.87 mg/dL (ref 0.57–1.00)
GFR calc Af Amer: 87 mL/min/{1.73_m2} (ref >59)
GFR calc non Af Amer: 75 mL/min/{1.73_m2} (ref >59)
Globulin, Total: 2.3 g/dL (ref 1.5–4.5)
Glucose: 87 mg/dL (ref 65–99)
Potassium: 4.4 mmol/L (ref 3.5–5.2)
Sodium: 137 mmol/L (ref 134–144)
Total Protein: 6.8 g/dL (ref 6.0–8.5)

## 2018-10-02 LAB — URIC ACID: Uric Acid: 4.9 mg/dL (ref 2.5–7.1)

## 2018-10-02 LAB — LIPID PANEL WITH LDL/HDL RATIO
Cholesterol, Total: 218 mg/dL — ABNORMAL HIGH (ref 100–199)
HDL: 64 mg/dL (ref >39)
LDL CALC: 141 mg/dL — AB (ref 0–99)
LDL/HDL RATIO: 2.2 ratio (ref 0.0–3.2)
Triglycerides: 63 mg/dL (ref 0–149)
VLDL Cholesterol Cal: 13 mg/dL (ref 5–40)

## 2018-10-02 LAB — TSH: TSH: 3.36 u[IU]/mL (ref 0.450–4.500)

## 2018-10-02 LAB — RHEUMATOID FACTOR: Rheumatoid fact SerPl-aCnc: <10 IU/mL (ref 0.0–13.9)

## 2018-10-02 LAB — T4, FREE: Free T4: 1.35 ng/dL (ref 0.82–1.77)

## 2018-10-03 NOTE — Progress Notes (Signed)
All labs are within limits, however cholesterol is slightly elevated, please mail her a low fat,low chol diet

## 2018-10-04 ENCOUNTER — Telehealth: Payer: Self-pay

## 2018-10-04 NOTE — Telephone Encounter (Signed)
-----   Message from [name], MD sent at 10/03/2018 10:49 AM EDT ----- All labs are within limits, however cholesterol is slightly elevated, please mail her a low fat,low chol diet

## 2018-10-04 NOTE — Telephone Encounter (Signed)
PT ADVISED FOR LABS AND MAILED PRUDENT DIET

## 2018-10-08 ENCOUNTER — Other Ambulatory Visit: Payer: Self-pay | Admitting: Internal Medicine

## 2018-10-09 ENCOUNTER — Other Ambulatory Visit: Payer: Self-pay | Admitting: Internal Medicine

## 2018-10-09 DIAGNOSIS — Z1231 Encounter for screening mammogram for malignant neoplasm of breast: Secondary | ICD-10-CM

## 2018-11-20 ENCOUNTER — Other Ambulatory Visit: Payer: Self-pay

## 2018-11-20 ENCOUNTER — Ambulatory Visit
Admission: RE | Admit: 2018-11-20 | Discharge: 2018-11-20 | Disposition: A | Discharge status: Home / Self Care | Payer: Managed Care, Other (non HMO) | Source: Ambulatory Visit | Attending: Internal Medicine | Admitting: Internal Medicine

## 2018-11-20 DIAGNOSIS — Z1231 Encounter for screening mammogram for malignant neoplasm of breast: Secondary | ICD-10-CM | POA: Insufficient documentation

## 2018-12-03 ENCOUNTER — Ambulatory Visit: Payer: Managed Care, Other (non HMO) | Admitting: Internal Medicine

## 2018-12-03 ENCOUNTER — Other Ambulatory Visit: Payer: Self-pay

## 2018-12-03 ENCOUNTER — Encounter: Payer: Self-pay | Admitting: Internal Medicine

## 2018-12-03 DIAGNOSIS — M2550 Pain in unspecified joint: Secondary | ICD-10-CM

## 2018-12-03 DIAGNOSIS — I10 Essential (primary) hypertension: Secondary | ICD-10-CM | POA: Diagnosis not present

## 2018-12-03 DIAGNOSIS — F439 Reaction to severe stress, unspecified: Secondary | ICD-10-CM

## 2018-12-03 DIAGNOSIS — Z20822 Contact with and (suspected) exposure to covid-19: Secondary | ICD-10-CM

## 2018-12-03 DIAGNOSIS — Z20828 Contact with and (suspected) exposure to other viral communicable diseases: Secondary | ICD-10-CM

## 2018-12-03 DIAGNOSIS — Z1239 Encounter for other screening for malignant neoplasm of breast: Secondary | ICD-10-CM | POA: Diagnosis not present

## 2018-12-03 MED ORDER — ESCITALOPRAM OXALATE 10 MG PO TABS: 10.0000 mg | ORAL_TABLET | Freq: Every day | ORAL | 3 refills | Status: DC

## 2018-12-03 NOTE — Progress Notes (Signed)
[facility] [place], [place] [address]  Internal MEDICINE  Office Visit Note  Patient Name: [name]  [id number]  [id number]  Date of Service: 12/11/2018  Chief Complaint  Patient presents with  . Medical Management of Chronic Issues    4 month follow up   . Hypertension  . Hypothyroidism    Hypertension This is a chronic problem. The current episode started more than 1 month ago. The problem has been gradually improving since onset. Associated symptoms include anxiety. Pertinent negatives include no chest pain, headaches, neck pain, palpitations or shortness of breath. There are no known risk factors for coronary artery disease. The current treatment provides significant improvement. There are no compliance problems.  Identifiable causes of hypertension include a thyroid problem.  Other This is a recurrent (c/o depressed/stressed, has been gaining weight, ?? exposure to covid 19, will like it to be checked ) problem. The current episode started more than 1 month ago. The problem has been unchanged. Associated symptoms include arthralgias, fatigue and myalgias. Pertinent negatives include no abdominal pain, chest pain, chills, congestion, coughing, diaphoresis, headaches, joint swelling, nausea, neck pain, numbness, rash, sore throat or vomiting.    Current Medication: Outpatient Encounter Medications as of 12/03/2018  Medication Sig  . Cholecalciferol (DIALYVITE VITAMIN D 5000) 125 MCG (5000 UT) capsule Take 5,000 Units by mouth daily.  [name] (EUCRISA) 2 % OINT Apply topically 2 (two) times daily.  [name] levothyroxine (SYNTHROID, LEVOTHROID) 88 MCG tablet TAKE 1 TABLET BY MOUTH DAILY  . Probiotic Product (TRUBIOTICS PO) Take by mouth daily.  [name] triamcinolone cream (KENALOG) 0.1 % Apply 1 application topically 2 (two) times daily.  . valsartan (DIOVAN) 80 MG tablet TAKE 1 TABLET BY MOUTH DAILY  . escitalopram (LEXAPRO) 10 MG tablet Take 1 tablet (10  mg total) by mouth daily.  . [DISCONTINUED] aspirin EC 81 MG tablet Take 81 mg by mouth daily.  . [DISCONTINUED] fish oil-omega-3 fatty acids 1000 MG capsule Take 2 g by mouth daily.  . [DISCONTINUED] rOPINIRole (REQUIP) 0.5 MG tablet TAKE 1 TABLET BY MOUTH EVERY NIGHT AT BEDTIME FOR RESTLESS LEGS (Patient not taking: Reported on 12/03/2018)  . [DISCONTINUED] triamterene-hydrochlorothiazide (MAXZIDE-25) 37.5-25 MG tablet Take 1 tablet by mouth daily. (Patient not taking: Reported on 12/03/2018)   No facility-administered encounter medications on file as of 12/03/2018.     Surgical History: Past Surgical History:  Procedure Laterality Date  . ABDOMINAL HYSTERECTOMY  2007   endometriosis, [name]  . VASCULAR SURGERY Left    left leg    Medical History: Past Medical History:  Diagnosis Date  . Back pain    spinal stenosis  . Psoriasis    [name]  . Spinal stenosis   . Thyroid disease     Family History: Family History  Problem Relation Age of Onset  . Heart disease Father   . Diabetes Paternal Uncle   . Breast cancer Neg Hx     Social History   Socioeconomic History  . Marital status: Married    Spouse name: Not on file  . Number of children: Not on file  . Years of education: Not on file  . Highest education level: Not on file  Occupational History  . Not on file  Social Needs  . Financial resource strain: Not on file  . Food insecurity    Worry: Not on file    Inability: Not on file  . Transportation needs    Medical:  Not on file    Non-medical: Not on file  Tobacco Use  . Smoking status: Never Smoker  . Smokeless tobacco: Never Used  Substance and Sexual Activity  . Alcohol use: No  . Drug use: No  . Sexual activity: Not on file  Lifestyle  . Physical activity    Days per week: Not on file    Minutes per session: Not on file  . Stress: Not on file  Relationships  . Social Herbalist on phone: Not on file    Gets together: Not on  file    Attends religious service: Not on file    Active member of club or organization: Not on file    Attends meetings of clubs or organizations: Not on file    Relationship status: Not on file  . Intimate partner violence    Fear of current or ex partner: Not on file    Emotionally abused: Not on file    Physically abused: Not on file    Forced sexual activity: Not on file  Other Topics Concern  . Not on file  Social History Narrative   Lives in [place] with husband. Has step-daughter 24YO and grandson.      Work - [employer] in [place] - regular      Exercise - walks occasional   Review of Systems  Constitutional: Positive for fatigue. Negative for chills, diaphoresis and unexpected weight change.  HENT: Negative for congestion, ear pain, postnasal drip, rhinorrhea, sinus pressure, sneezing and sore throat.   Eyes: Negative for photophobia, discharge, redness, itching and visual disturbance.  Respiratory: Negative for cough, chest tightness, shortness of breath and wheezing.   Cardiovascular: Negative for chest pain, palpitations and leg swelling.  Gastrointestinal: Negative for abdominal pain, constipation, diarrhea, nausea and vomiting.  Genitourinary: Negative for dysuria, flank pain and frequency.  Musculoskeletal: Positive for arthralgias and myalgias. Negative for back pain, gait problem, joint swelling and neck pain.  Skin: Negative for color change and rash.  Allergic/Immunologic: Negative for environmental allergies and food allergies.  Neurological: Negative.  Negative for dizziness, tremors, numbness and headaches.  Hematological: Negative for adenopathy. Does not bruise/bleed easily.  Psychiatric/Behavioral: Positive for dysphoric mood. Negative for agitation, behavioral problems (depression), hallucinations, sleep disturbance and suicidal ideas. The patient is nervous/anxious.    Vital Signs: BP 132/86   Pulse 69   Resp 16   Ht 5\' 3"  (1.6 m)   Wt  186 lb (84.4 kg)   SpO2 99%   BMI 32.95 kg/m   Physical Exam Constitutional:      General: She is not in acute distress.    Appearance: She is well-developed. She is not diaphoretic.  HENT:     Head: Normocephalic and atraumatic.     Mouth/Throat:     Pharynx: No oropharyngeal exudate.  Eyes:     Pupils: Pupils are equal, round, and reactive to light.  Neck:     Musculoskeletal: Normal range of motion and neck supple.     Thyroid: No thyromegaly.     Vascular: No JVD.     Trachea: No tracheal deviation.  Cardiovascular:     Rate and Rhythm: Normal rate and regular rhythm.     Heart sounds: Normal heart sounds. No murmur. No friction rub. No gallop.   Pulmonary:     Effort: Pulmonary effort is normal. No respiratory distress.     Breath sounds: No wheezing or rales.  Chest:     Chest wall: No tenderness.  Abdominal:     General: Bowel sounds are normal.     Palpations: Abdomen is soft.  Musculoskeletal: Normal range of motion.  Lymphadenopathy:     Cervical: No cervical adenopathy.  Skin:    General: Skin is warm and dry.  Neurological:     Mental Status: She is alert and oriented to person, place, and time.     Cranial Nerves: No cranial nerve deficit.  Psychiatric:        Behavior: Behavior normal.        Thought Content: Thought content normal.        Judgment: Judgment normal.    Assessment/Plan: 1. Exposure to Covid-19 Virus Pt works at [employer] and will like to have tests done there  - SARS-CoV-2 Antibodies - SAR CoV2 Serology (COVID 19)AB(IGG)IA - ANA w/Reflex if Positive; Future - ANA w/Reflex if Positive  2. Screening breast examination - Mammogram ordered   3. Essential hypertension, benign Continue meds as before   4. Arthralgia, unspecified joint - ANA w/Reflex if Positive - ANA w/Reflex if Positive  5. Feeling stressed out Stressed on healthy life style, light walking and limiting carbs intake  - escitalopram (LEXAPRO) 10 MG tablet; Take 1  tablet (10 mg total) by mouth daily.  Dispense: 30 tablet; Refill: 3   General Counseling: Lillyahna verbalizes understanding of the findings of todays visit and agrees with plan of treatment. I have discussed any further diagnostic evaluation that may be needed or ordered today. We also reviewed her medications today. she has been encouraged to call the office with any questions or concerns that should arise related to todays visit.    Orders Placed This Encounter  Procedures  . SAR CoV2 Serology (COVID 19)AB(IGG)IA  . SARS-CoV-2 Antibodies  . ANA w/Reflex if Positive  . ANA w/Reflex if Positive    Meds ordered this encounter  Medications  . escitalopram (LEXAPRO) 10 MG tablet    Sig: Take 1 tablet (10 mg total) by mouth daily.    Dispense:  30 tablet    Refill:  3    Time spent:25 Minutes  Dr [name] Internal medicine

## 2018-12-07 LAB — ANA W/REFLEX IF POSITIVE: Anti Nuclear Antibody (ANA): NEGATIVE

## 2018-12-07 LAB — SAR COV2 SEROLOGY (COVID19)AB(IGG),IA: SARS-CoV-2 Ab, IgG: NEGATIVE

## 2019-02-04 ENCOUNTER — Ambulatory Visit: Payer: Managed Care, Other (non HMO) | Admitting: Internal Medicine

## 2019-02-25 ENCOUNTER — Encounter: Payer: Self-pay | Admitting: Internal Medicine

## 2019-02-25 ENCOUNTER — Ambulatory Visit: Payer: Managed Care, Other (non HMO) | Admitting: Internal Medicine

## 2019-02-25 ENCOUNTER — Other Ambulatory Visit: Payer: Self-pay

## 2019-02-25 DIAGNOSIS — E039 Hypothyroidism, unspecified: Secondary | ICD-10-CM

## 2019-02-25 DIAGNOSIS — I10 Essential (primary) hypertension: Secondary | ICD-10-CM

## 2019-02-25 NOTE — Progress Notes (Signed)
[name], [name] [id number]  Internal MEDICINE  Office Visit Note  Patient Name: [name]  [id number]  [id number]  Date of Service: 02/27/2019  Chief Complaint  Patient presents with  . Hypertension  . Hypothyroidism    HPI Pt is here for routine follow up, BP is improved, no new complaints, trying to lose weight, started walking   Current Medication: Outpatient Encounter Medications as of 02/25/2019  Medication Sig  . Cholecalciferol (DIALYVITE VITAMIN D 5000) 125 MCG (5000 UT) capsule Take 5,000 Units by mouth daily.  [name] (EUCRISA) 2 % OINT Apply topically 2 (two) times daily.  [name] escitalopram (LEXAPRO) 10 MG tablet Take 1 tablet (10 mg total) by mouth daily.  [name] levothyroxine (SYNTHROID, LEVOTHROID) 88 MCG tablet TAKE 1 TABLET BY MOUTH DAILY  . Probiotic Product (TRUBIOTICS PO) Take by mouth daily.  [name] triamcinolone cream (KENALOG) 0.1 % Apply 1 application topically 2 (two) times daily.  . valsartan (DIOVAN) 80 MG tablet TAKE 1 TABLET BY MOUTH DAILY   No facility-administered encounter medications on file as of 02/25/2019.     Surgical History: Past Surgical History:  Procedure Laterality Date  . ABDOMINAL HYSTERECTOMY  2007   endometriosis, [name]  . VASCULAR SURGERY Left    left leg    Medical History: Past Medical History:  Diagnosis Date  . Back pain    spinal stenosis  . Hypertension   . Psoriasis    [name]  . Spinal stenosis   . Thyroid disease     Family History: Family History  Problem Relation Age of Onset  . Heart disease Father   . Diabetes Paternal Uncle   . Breast cancer Neg Hx     Social History   Socioeconomic History  . Marital status: Married    Spouse name: Not on file  . Number of children: Not on file  . Years of education: Not on file  . Highest education level: Not on file  Occupational History  . Not on file  Social Needs  . Financial resource strain:  Not on file  . Food insecurity    Worry: Not on file    Inability: Not on file  . Transportation needs    Medical: Not on file    Non-medical: Not on file  Tobacco Use  . Smoking status: Never Smoker  . Smokeless tobacco: Never Used  Substance and Sexual Activity  . Alcohol use: No  . Drug use: No  . Sexual activity: Not on file  Lifestyle  . Physical activity    Days per week: Not on file    Minutes per session: Not on file  . Stress: Not on file  Relationships  . Social Herbalist on phone: Not on file    Gets together: Not on file    Attends religious service: Not on file    Active member of club or organization: Not on file    Attends meetings of clubs or organizations: Not on file    Relationship status: Not on file  . Intimate partner violence    Fear of current or ex partner: Not on file    Emotionally abused: Not on file    Physically abused: Not on file    Forced sexual activity: Not on file  Other Topics Concern  . Not on file  Social History Narrative   Lives in [place] with husband. Has step-daughter 24YO and grandson.  Work - [employer] in [place] - regular      Exercise - walks occasional    Review of Systems  Constitutional: Negative for chills, diaphoresis and fatigue.  HENT: Negative for ear pain, postnasal drip and sinus pressure.   Eyes: Negative for photophobia, discharge, redness, itching and visual disturbance.  Respiratory: Negative for cough, shortness of breath and wheezing.   Cardiovascular: Negative for chest pain, palpitations and leg swelling.  Gastrointestinal: Negative for abdominal pain, constipation, diarrhea, nausea and vomiting.  Genitourinary: Negative for dysuria and flank pain.  Musculoskeletal: Negative for arthralgias, back pain, gait problem and neck pain.  Skin: Negative for color change.  Allergic/Immunologic: Negative for environmental allergies and food allergies.  Neurological: Negative for  dizziness and headaches.  Hematological: Does not bruise/bleed easily.  Psychiatric/Behavioral: Negative for agitation, behavioral problems (depression) and hallucinations.    Vital Signs: BP 118/80   Pulse 85   Temp 97.6 F (36.4 C)   Resp 16   Ht 5\' 3"  (1.6 m)   Wt 189 lb (85.7 kg)   SpO2 98%   BMI 33.48 kg/m    Physical Exam Constitutional:      General: She is not in acute distress.    Appearance: She is well-developed. She is not diaphoretic.  Neck:     Thyroid: No thyromegaly.     Vascular: No JVD.     Trachea: No tracheal deviation.  Cardiovascular:     Rate and Rhythm: Normal rate and regular rhythm.     Heart sounds: Normal heart sounds. No murmur. No friction rub. No gallop.   Pulmonary:     Effort: Pulmonary effort is normal. No respiratory distress.     Breath sounds: No wheezing or rales.  Chest:     Chest wall: No tenderness.  Musculoskeletal: Normal range of motion.  Skin:    General: Skin is warm and dry.  Neurological:     Mental Status: She is alert and oriented to person, place, and time.     Cranial Nerves: No cranial nerve deficit.  Psychiatric:        Behavior: Behavior normal.        Thought Content: Thought content normal.        Judgment: Judgment normal.   Assessment/Plan: 1. Essential hypertension, benign Improved bp, continue all meds   2. Hypothyroidism, unspecified type Continue synthroid   General Counseling: Joie verbalizes understanding of the findings of todays visit and agrees with plan of treatment. I have discussed any further diagnostic evaluation that may be needed or ordered today. We also reviewed her medications today. she has been encouraged to call the office with any questions or concerns that should arise related to todays visit.  Time spent:15 Minutes  Dr [name] Internal medicine

## 2019-07-18 ENCOUNTER — Telehealth: Payer: Self-pay

## 2019-07-18 NOTE — Telephone Encounter (Signed)
LMOM FOR PATIENT TO SCREEN AND CONFIRM FOR 07-22-19 OV.

## 2019-07-18 NOTE — Telephone Encounter (Signed)
Confirmed appointment on 07/22/2019 and screened for covid. klh 

## 2019-07-22 ENCOUNTER — Other Ambulatory Visit: Payer: Self-pay

## 2019-07-22 ENCOUNTER — Encounter: Payer: Self-pay | Admitting: Internal Medicine

## 2019-07-22 ENCOUNTER — Ambulatory Visit (INDEPENDENT_AMBULATORY_CARE_PROVIDER_SITE_OTHER): Payer: Managed Care, Other (non HMO) | Admitting: Internal Medicine

## 2019-07-22 VITALS — BP 132/84 | HR 65 | Temp 97.6°F | Resp 16 | Ht 64.0 in | Wt 193.0 lb

## 2019-07-22 DIAGNOSIS — E039 Hypothyroidism, unspecified: Secondary | ICD-10-CM

## 2019-07-22 DIAGNOSIS — Z1211 Encounter for screening for malignant neoplasm of colon: Secondary | ICD-10-CM | POA: Diagnosis not present

## 2019-07-22 DIAGNOSIS — R300 Dysuria: Secondary | ICD-10-CM | POA: Diagnosis not present

## 2019-07-22 DIAGNOSIS — G479 Sleep disorder, unspecified: Secondary | ICD-10-CM

## 2019-07-22 DIAGNOSIS — Z0001 Encounter for general adult medical examination with abnormal findings: Secondary | ICD-10-CM | POA: Diagnosis not present

## 2019-07-22 DIAGNOSIS — I10 Essential (primary) hypertension: Secondary | ICD-10-CM

## 2019-07-22 MED ORDER — VALSARTAN 80 MG PO TABS: 80.0000 mg | ORAL_TABLET | Freq: Every day | ORAL | 3 refills | Status: DC

## 2019-07-22 MED ORDER — BUPROPION HCL ER (XL) 150 MG PO TB24: 150.0000 mg | ORAL_TABLET | Freq: Every day | ORAL | 6 refills | Status: DC

## 2019-07-22 MED ORDER — LEVOTHYROXINE SODIUM 88 MCG PO TABS: 88.0000 ug | ORAL_TABLET | Freq: Every day | ORAL | 3 refills | Status: DC

## 2019-07-22 NOTE — Progress Notes (Signed)
[facility] [place], [place] [address]  Internal MEDICINE  Office Visit Note  Patient Name: [name]  [id number]  [id number]  Date of Service: 07/24/2019   Chief Complaint  Patient presents with  . Annual Exam  . Hypertension  . Hypothyroidism    HPI Pt is her for annual CPE. She has been feeling well, no new complaints however just concerned about her weight since she has been working from home during covid. She stopped taking Lexapro due to side effects. She is c/o mood swings and being depressed at times. Has problem sleeping as well. Denies any c/p sob   Current Medication: Outpatient Encounter Medications as of 07/22/2019  Medication Sig  . Cholecalciferol (DIALYVITE VITAMIN D 5000) 125 MCG (5000 UT) capsule Take 5,000 Units by mouth daily.  [name] (EUCRISA) 2 % OINT Apply topically 2 (two) times daily.  [name] levothyroxine (SYNTHROID) 88 MCG tablet Take 1 tablet (88 mcg total) by mouth daily.  . Probiotic Product (TRUBIOTICS PO) Take by mouth daily.  [name] triamcinolone cream (KENALOG) 0.1 % Apply 1 application topically 2 (two) times daily.  . valsartan (DIOVAN) 80 MG tablet Take 1 tablet (80 mg total) by mouth daily.  . [DISCONTINUED] levothyroxine (SYNTHROID, LEVOTHROID) 88 MCG tablet TAKE 1 TABLET BY MOUTH DAILY  . [DISCONTINUED] valsartan (DIOVAN) 80 MG tablet TAKE 1 TABLET BY MOUTH DAILY  . buPROPion (WELLBUTRIN XL) 150 MG 24 hr tablet Take 1 tablet (150 mg total) by mouth daily.  . [DISCONTINUED] escitalopram (LEXAPRO) 10 MG tablet Take 1 tablet (10 mg total) by mouth daily. (Patient not taking: Reported on 07/22/2019)   No facility-administered encounter medications on file as of 07/22/2019.   Surgical History: Past Surgical History:  Procedure Laterality Date  . ABDOMINAL HYSTERECTOMY  2007   endometriosis, [name]  . VASCULAR SURGERY Left    left leg    Medical History: Past Medical History:  Diagnosis Date  . Back  pain    spinal stenosis  . Hypertension   . Psoriasis    [name]  . Spinal stenosis   . Thyroid disease     Family History: Family History  Problem Relation Age of Onset  . Heart disease Father   . Diabetes Paternal Uncle   . Breast cancer Neg Hx     Social History   Socioeconomic History  . Marital status: Married    Spouse name: Not on file  . Number of children: Not on file  . Years of education: Not on file  . Highest education level: Not on file  Occupational History  . Not on file  Tobacco Use  . Smoking status: Never Smoker  . Smokeless tobacco: Never Used  Substance and Sexual Activity  . Alcohol use: No  . Drug use: No  . Sexual activity: Not on file  Other Topics Concern  . Not on file  Social History Narrative   Lives in [place] with husband. Has step-daughter 24YO and grandson.      Work - [employer] in [place] - regular      Exercise - walks occasional   Social Determinants of Radio broadcast assistant Strain:   . Difficulty of Paying Living Expenses:   Food Insecurity:   . Worried About Charity fundraiser in the Last Year:   . [place] in the Last Year:   Transportation Needs:   . Lack of  Transportation (Medical):   [name] Lack of Transportation (Non-Medical):   Physical Activity:   . Days of Exercise per Week:   . Minutes of Exercise per Session:   Stress:   . Feeling of Stress :   Social Connections:   . Frequency of Communication with Friends and Family:   . Frequency of Social Gatherings with Friends and Family:   . Attends Religious Services:   . Active Member of Clubs or Organizations:   . Attends Archivist Meetings:   [name] Marital Status:   Intimate Partner Violence:   . Fear of Current or Ex-Partner:   . Emotionally Abused:   [name] Physically Abused:   . Sexually Abused:     Review of Systems  Constitutional: Negative for chills, diaphoresis and fatigue.  HENT: Negative for ear pain, postnasal drip  and sinus pressure.   Eyes: Negative for photophobia, discharge, redness, itching and visual disturbance.  Respiratory: Negative for cough, shortness of breath and wheezing.   Cardiovascular: Negative for chest pain, palpitations and leg swelling.  Gastrointestinal: Negative for abdominal pain, constipation, diarrhea, nausea and vomiting.  Genitourinary: Negative for dysuria and flank pain.  Musculoskeletal: Negative for arthralgias, back pain, gait problem and neck pain.  Skin: Negative for color change.  Allergic/Immunologic: Negative for environmental allergies and food allergies.  Neurological: Negative for dizziness and headaches.  Hematological: Does not bruise/bleed easily.  Psychiatric/Behavioral: Negative for agitation, behavioral problems (depression) and hallucinations.     Vital signs: BP 132/84   Pulse 65   Temp 97.6 F (36.4 C)   Resp 16   Ht 5\' 4"  (1.626 m)   Wt 193 lb (87.5 kg)   SpO2 100%   BMI 33.13 kg/m    Physical Exam Constitutional:      General: She is not in acute distress.    Appearance: She is well-developed. She is not diaphoretic.  HENT:     Head: Normocephalic and atraumatic.     Mouth/Throat:     Pharynx: No oropharyngeal exudate.  Eyes:     General: Scleral icterus: z00.01.     Pupils: Pupils are equal, round, and reactive to light.  Neck:     Thyroid: No thyromegaly.     Vascular: No JVD.     Trachea: No tracheal deviation.  Cardiovascular:     Rate and Rhythm: Normal rate and regular rhythm.     Heart sounds: Normal heart sounds. No murmur. No friction rub. No gallop.   Pulmonary:     Effort: Pulmonary effort is normal. No respiratory distress.     Breath sounds: No wheezing or rales.  Chest:     Chest wall: No tenderness.     Breasts:        Right: Normal. No mass.        Left: Normal. No mass.  Abdominal:     General: Bowel sounds are normal.     Palpations: Abdomen is soft.  Musculoskeletal:        General: Normal range of  motion.     Cervical back: Normal range of motion and neck supple.  Lymphadenopathy:     Cervical: No cervical adenopathy.     Upper Body:     Right upper body: No axillary adenopathy.     Left upper body: No axillary adenopathy.  Skin:    General: Skin is warm and dry.  Neurological:     Mental Status: She is alert and oriented to person, place, and time.  Cranial Nerves: No cranial nerve deficit.  Psychiatric:        Behavior: Behavior normal.        Thought Content: Thought content normal.        Judgment: Judgment normal.    Assessment/Plan: 1. Encounter for general adult medical examination with abnormal findings Pt is up to date all preventive health maintenance   2. Encounter for screening colonoscopy  Ambulatory referral to Gastroenterology  3. Hypothyroidism, unspecified type Continue to take Synthroid, will check TSH and free T4 - levothyroxine (SYNTHROID) 88 MCG tablet; Take 1 tablet (88 mcg total) by mouth daily.  Dispense: 90 tablet; Refill: 3  4. Essential hypertension, benign Blood pressure is under good control, will continue to monitor. DASH Diet: Care Instructions Your Care Instructions  The DASH diet is an eating plan that can help lower your blood pressure. DASH stands for Dietary Approaches to Stop Hypertension. Hypertension is high blood pressure. The DASH diet focuses on eating foods that are high in calcium, potassium, and magnesium. These nutrients can lower blood pressure. The foods that are highest in these nutrients are fruits, vegetables, low-fat dairy products, nuts, seeds, and legumes. But taking calcium, potassium, and magnesium supplements instead of eating foods that are high in those nutrients does not have the same effect. The DASH diet also includes whole grains, fish, and poultry. The DASH diet is one of several lifestyle changes your doctor may recommend to lower your high blood pressure. Your doctor may also want you to decrease the amount  of sodium in your diet. Lowering sodium while following the DASH diet can lower blood pressure even further than just the DASH diet alone. Follow-up care is a key part of your treatment and safety. Be sure to make and go to all appointments, and call your doctor if you are having problems. It's also a good idea to know your test results and keep a list of the medicines you take. How can you care for yourself at home? Following the DASH diet  Eat 4 to 5 servings of fruit each day. A serving is 1 medium-sized piece of fruit,  cup chopped or canned fruit, 1/4 cup dried fruit, or 4 ounces ( cup) of fruit juice. Choose fruit more often than fruit juice.  Eat 4 to 5 servings of vegetables each day. A serving is 1 cup of lettuce or raw leafy vegetables,  cup of chopped or cooked vegetables, or 4 ounces ( cup) of vegetable juice. Choose vegetables more often than vegetable juice.  Get 2 to 3 servings of low-fat and fat-free dairy each day. A serving is 8 ounces of milk, 1 cup of yogurt, or 1  ounces of cheese.  Eat 6 to 8 servings of grains each day. A serving is 1 slice of bread, 1 ounce of dry cereal, or  cup of cooked rice, pasta, or cooked cereal. Try to choose whole-grain products as much as possible.  Limit lean meat, poultry, and fish to 2 servings each day. A serving is 3 ounces, about the size of a deck of cards.  Eat 4 to 5 servings of nuts, seeds, and legumes (cooked dried beans, lentils, and split peas) each week. A serving is 1/3 cup of nuts, 2 tablespoons of seeds, or  cup of cooked beans or peas.  Limit fats and oils to 2 to 3 servings each day. A serving is 1 teaspoon of vegetable oil or 2 tablespoons of salad dressing.  Limit sweets and added sugars  to 5 servings or less a week. A serving is 1 tablespoon jelly or jam,  cup sorbet, or 1 cup of lemonade.  Eat less than 2,300 milligrams (mg) of sodium a day. If you limit your sodium to 1,500 mg a day, you can lower your blood  pressure even more. Tips for success  Start small. Do not try to make dramatic changes to your diet all at once. You might feel that you are missing out on your favorite foods and then be more likely to not follow the plan. Make small changes, and stick with them. Once those changes become habit, add a few more changes.  Try some of the following: ? Make it a goal to eat a fruit or vegetable at every meal and at snacks. This will make it easy to get the recommended amount of fruits and vegetables each day. ? Try yogurt topped with fruit and nuts for a snack or healthy dessert. ? Add lettuce, tomato, cucumber, and onion to sandwiches. ? Combine a ready-made pizza crust with low-fat mozzarella cheese and lots of vegetable toppings. Try using tomatoes, squash, spinach, broccoli, carrots, cauliflower, and onions. ? Have a variety of cut-up vegetables with a low-fat dip as an appetizer instead of chips and dip. ? Sprinkle sunflower seeds or chopped almonds over salads. Or try adding chopped walnuts or almonds to cooked vegetables. ? Try some vegetarian meals using beans and peas. Add garbanzo or kidney beans to salads. Make burritos and tacos with mashed pinto beans or black beans.  - valsartan (DIOVAN) 80 MG tablet; Take 1 tablet (80 mg total) by mouth daily.  Dispense: 90 tablet; Refill: 3  5. Dysuria - UA/M w/rflx Culture, Routine - Microscopic Examination  6. 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  Dispense: 30 tablet; Refill: 6 General Counseling: Jaylea verbalizes understanding of the findings of todays visit and agrees with plan of treatment. I have discussed any further diagnostic evaluation that may be needed or ordered today. We also reviewed her medications today. she has been encouraged to call the office with any questions or concerns that  should arise related to todays visit.  Counseling: Obesity Counseling: Risk Assessment: An assessment of behavioral risk factors was made today and includes lack of exercise sedentary lifestyle, lack of portion control and poor dietary habits.  Risk Modification Advice: She was counseled on portion control guidelines. Restricting daily caloric intake to. . The detrimental long term effects of obesity on her health and ongoing poor compliance was also discussed with the patient.     Orders Placed This Encounter  Procedures  . Microscopic Examination  . UA/M w/rflx Culture, Routine  . Ambulatory referral to Gastroenterology    Meds ordered this encounter  Medications  . buPROPion (WELLBUTRIN XL) 150 MG 24 hr tablet    Sig: Take 1 tablet (150 mg total) by mouth daily.    Dispense:  30 tablet    Refill:  6  . valsartan (DIOVAN) 80 MG tablet    Sig: Take 1 tablet (80 mg total) by mouth daily.    Dispense:  90 tablet    Refill:  3  . levothyroxine (SYNTHROID) 88 MCG tablet    Sig: Take 1 tablet (88 mcg total) by mouth daily.    Dispense:  90 tablet    Refill:  3  Time spent:30 Minutes

## 2019-07-23 LAB — UA/M W/RFLX CULTURE, ROUTINE
Bilirubin, UA: NEGATIVE
Glucose, UA: NEGATIVE
Ketones, UA: NEGATIVE
Leukocytes,UA: NEGATIVE
Nitrite, UA: NEGATIVE
Protein,UA: NEGATIVE
RBC, UA: NEGATIVE
Specific Gravity, UA: <=1.005 — AB (ref 1.005–1.030)
Urobilinogen, Ur: 0.2 mg/dL (ref 0.2–1.0)
pH, UA: 7.5 (ref 5.0–7.5)

## 2019-07-23 LAB — MICROSCOPIC EXAMINATION
Bacteria, UA: NONE SEEN
Casts: NONE SEEN /lpf
Epithelial Cells (non renal): NONE SEEN /hpf (ref 0–10)
RBC, Urine: NONE SEEN /hpf (ref 0–2)
WBC, UA: NONE SEEN /hpf (ref 0–5)

## 2019-08-18 ENCOUNTER — Other Ambulatory Visit: Payer: Self-pay

## 2019-08-18 ENCOUNTER — Ambulatory Visit: Payer: Managed Care, Other (non HMO) | Attending: Internal Medicine

## 2019-08-18 DIAGNOSIS — Z23 Encounter for immunization: Secondary | ICD-10-CM

## 2019-08-18 NOTE — Progress Notes (Signed)
   Covid-19 Vaccination Clinic  Name:  [name]    MRN: [id number] DOB: 09-Oct-1960  08/18/2019  [name] was observed post Covid-19 immunization for 15 minutes without incident. She was provided with Vaccine Information Sheet and instruction to access the V-Safe system.   [name] was instructed to call 911 with any severe reactions post vaccine: [name] Difficulty breathing  . Swelling of face and throat  . A fast heartbeat  . A bad rash all over body  . Dizziness and weakness   Immunizations Administered    Name Date Dose VIS Date Route   Pfizer COVID-19 Vaccine 08/18/2019 12:47 PM 0.3 mL 04/25/2019 Intramuscular   Manufacturer: [place]   Lot: [id number]   [place]: [id number]

## 2019-09-17 ENCOUNTER — Ambulatory Visit: Payer: Managed Care, Other (non HMO) | Attending: Internal Medicine

## 2019-09-17 DIAGNOSIS — Z23 Encounter for immunization: Secondary | ICD-10-CM

## 2019-09-17 NOTE — Progress Notes (Signed)
   Covid-19 Vaccination Clinic  Name:  CUBIE HARGROVE    MRN: [id number] DOB: Dec 01, 1960  09/17/2019  Ms. Fredenburg was observed post Covid-19 immunization for 15 minutes without incident. She was provided with Vaccine Information Sheet and instruction to access the V-Safe system.   Ms. Caress was instructed to call 911 with any severe reactions post vaccine: [name] Difficulty breathing  . Swelling of face and throat  . A fast heartbeat  . A bad rash all over body  . Dizziness and weakness   Immunizations Administered    Name Date Dose VIS Date Route   Pfizer COVID-19 Vaccine 09/17/2019  8:07 AM 0.3 mL 07/09/2018 Intramuscular   Manufacturer: [place]   Lot: [id number]   [place]: [id number]

## 2019-10-06 ENCOUNTER — Telehealth (INDEPENDENT_AMBULATORY_CARE_PROVIDER_SITE_OTHER): Payer: Self-pay | Admitting: Gastroenterology

## 2019-10-06 DIAGNOSIS — Z1211 Encounter for screening for malignant neoplasm of colon: Secondary | ICD-10-CM

## 2019-10-06 NOTE — Progress Notes (Signed)
Gastroenterology Pre-Procedure Review  Request Date: 10/31/19 Requesting Physician: [name]  PATIENT REVIEW QUESTIONS: The patient responded to the following health history questions as indicated:    1. Are you having any GI issues? no 2. Do you have a personal history of Polyps? yes (2009 with Dr. Gustavo Lah) 3. Do you have a family history of Colon Cancer or Polyps? no 4. Diabetes Mellitus? no 5. Joint replacements in the past 12 months?no 6. Major health problems in the past 3 months?no 7. Any artificial heart valves, MVP, or defibrillator?no    MEDICATIONS & ALLERGIES:    Patient reports the following regarding taking any anticoagulation/antiplatelet therapy:   Plavix, Coumadin, Eliquis, Xarelto, Lovenox, Pradaxa, Brilinta, or Effient? no Aspirin? no  Patient confirms/reports the following medications:  Current Outpatient Medications  Medication Sig Dispense Refill  . buPROPion (WELLBUTRIN XL) 150 MG 24 hr tablet Take 1 tablet (150 mg total) by mouth daily. 30 tablet 6  . Cholecalciferol (DIALYVITE VITAMIN D 5000) 125 MCG (5000 UT) capsule Take 5,000 Units by mouth daily.    [name] levothyroxine (SYNTHROID) 88 MCG tablet Take 1 tablet (88 mcg total) by mouth daily. 90 tablet 3  . Probiotic Product (TRUBIOTICS PO) Take by mouth daily.    . valsartan (DIOVAN) 80 MG tablet Take 1 tablet (80 mg total) by mouth daily. 90 tablet 3  . triamcinolone cream (KENALOG) 0.1 % Apply 1 application topically 2 (two) times daily.     No current facility-administered medications for this visit.    Patient confirms/reports the following allergies:  No Known Allergies  No orders of the defined types were placed in this encounter.   AUTHORIZATION INFORMATION Primary Insurance: 1D#: Group #:  Secondary Insurance: 1D#: Group #:  SCHEDULE INFORMATION: Date: 10/31/19 Time: [facility]

## 2019-10-09 ENCOUNTER — Other Ambulatory Visit: Payer: Self-pay

## 2019-10-09 DIAGNOSIS — Z1211 Encounter for screening for malignant neoplasm of colon: Secondary | ICD-10-CM

## 2019-10-09 MED ORDER — NA SULFATE-K SULFATE-MG SULF 17.5-3.13-1.6 GM/177ML PO SOLN: 1.0000 | Freq: Once | ORAL | 0 refills | Status: AC

## 2019-10-20 ENCOUNTER — Other Ambulatory Visit: Payer: Self-pay

## 2019-10-20 ENCOUNTER — Encounter: Payer: Self-pay | Admitting: Gastroenterology

## 2019-10-29 ENCOUNTER — Other Ambulatory Visit: Payer: Self-pay

## 2019-10-29 ENCOUNTER — Other Ambulatory Visit
Admission: RE | Admit: 2019-10-29 | Discharge: 2019-10-29 | Disposition: A | Discharge status: Home / Self Care | Payer: Managed Care, Other (non HMO) | Source: Ambulatory Visit | Attending: Gastroenterology | Admitting: Gastroenterology

## 2019-10-29 DIAGNOSIS — Z01812 Encounter for preprocedural laboratory examination: Secondary | ICD-10-CM | POA: Diagnosis present

## 2019-10-29 DIAGNOSIS — Z20822 Contact with and (suspected) exposure to covid-19: Secondary | ICD-10-CM | POA: Diagnosis not present

## 2019-10-30 LAB — SARS CORONAVIRUS 2 (TAT 6-24 HRS): SARS Coronavirus 2: NEGATIVE

## 2019-10-30 NOTE — Discharge Instructions (Signed)
General Anesthesia, Adult, Care After This sheet gives you information about how to care for yourself after your procedure. Your health care provider may also give you more specific instructions. If you have problems or questions, contact your health care provider. What can I expect after the procedure? After the procedure, the following side effects are common:  Pain or discomfort at the IV site.  Nausea.  Vomiting.  Sore throat.  Trouble concentrating.  Feeling cold or chills.  Weak or tired.  Sleepiness and fatigue.  Soreness and body aches. These side effects can affect parts of the body that were not involved in surgery. Follow these instructions at home:  For at least 24 hours after the procedure:  Have a responsible adult stay with you. It is important to have someone help care for you until you are awake and alert.  Rest as needed.  Do not: ? Participate in activities in which you could fall or become injured. ? Drive. ? Use heavy machinery. ? Drink alcohol. ? Take sleeping pills or medicines that cause drowsiness. ? Make important decisions or sign legal documents. ? Take care of children on your own. Eating and drinking  Follow any instructions from your health care provider about eating or drinking restrictions.  When you feel hungry, start by eating small amounts of foods that are soft and easy to digest (bland), such as toast. Gradually return to your regular diet.  Drink enough fluid to keep your urine pale yellow.  If you vomit, rehydrate by drinking water, juice, or clear broth. General instructions  If you have sleep apnea, surgery and certain medicines can increase your risk for breathing problems. Follow instructions from your health care provider about wearing your sleep device: ? Anytime you are sleeping, including during daytime naps. ? While taking prescription pain medicines, sleeping medicines, or medicines that make you drowsy.  Return to  your normal activities as told by your health care provider. Ask your health care provider what activities are safe for you.  Take over-the-counter and prescription medicines only as told by your health care provider.  If you smoke, do not smoke without supervision.  Keep all follow-up visits as told by your health care provider. This is important. Contact a health care provider if:  You have nausea or vomiting that does not get better with medicine.  You cannot eat or drink without vomiting.  You have pain that does not get better with medicine.  You are unable to pass urine.  You develop a skin rash.  You have a fever.  You have redness around your IV site that gets worse. Get help right away if:  You have difficulty breathing.  You have chest pain.  You have blood in your urine or stool, or you vomit blood. Summary  After the procedure, it is common to have a sore throat or nausea. It is also common to feel tired.  Have a responsible adult stay with you for the first 24 hours after general anesthesia. It is important to have someone help care for you until you are awake and alert.  When you feel hungry, start by eating small amounts of foods that are soft and easy to digest (bland), such as toast. Gradually return to your regular diet.  Drink enough fluid to keep your urine pale yellow.  Return to your normal activities as told by your health care provider. Ask your health care provider what activities are safe for you. This information is not   intended to replace advice given to you by your health care provider. Make sure you discuss any questions you have with your health care provider. Document Revised: 05/04/2017 Document Reviewed: 12/15/2016 Elsevier Patient Education  2020 Elsevier Inc.  

## 2019-10-31 ENCOUNTER — Ambulatory Visit
Admission: RE | Admit: 2019-10-31 | Discharge: 2019-10-31 | Disposition: A | Discharge status: Home / Self Care | Payer: Managed Care, Other (non HMO) | Attending: Gastroenterology | Admitting: Gastroenterology

## 2019-10-31 ENCOUNTER — Ambulatory Visit: Payer: Managed Care, Other (non HMO) | Admitting: Anesthesiology

## 2019-10-31 ENCOUNTER — Encounter
Admission: RE | Disposition: A | Discharge status: Home / Self Care | Payer: Self-pay | Source: Home / Self Care | Attending: Gastroenterology

## 2019-10-31 ENCOUNTER — Encounter: Payer: Self-pay | Admitting: Gastroenterology

## 2019-10-31 ENCOUNTER — Other Ambulatory Visit: Payer: Self-pay

## 2019-10-31 DIAGNOSIS — Z79890 Hormone replacement therapy (postmenopausal): Secondary | ICD-10-CM | POA: Insufficient documentation

## 2019-10-31 DIAGNOSIS — L409 Psoriasis, unspecified: Secondary | ICD-10-CM | POA: Diagnosis not present

## 2019-10-31 DIAGNOSIS — Z87891 Personal history of nicotine dependence: Secondary | ICD-10-CM | POA: Diagnosis not present

## 2019-10-31 DIAGNOSIS — Z1211 Encounter for screening for malignant neoplasm of colon: Secondary | ICD-10-CM | POA: Diagnosis not present

## 2019-10-31 DIAGNOSIS — I10 Essential (primary) hypertension: Secondary | ICD-10-CM | POA: Diagnosis not present

## 2019-10-31 DIAGNOSIS — K640 First degree hemorrhoids: Secondary | ICD-10-CM | POA: Diagnosis not present

## 2019-10-31 DIAGNOSIS — Z79899 Other long term (current) drug therapy: Secondary | ICD-10-CM | POA: Diagnosis not present

## 2019-10-31 DIAGNOSIS — E039 Hypothyroidism, unspecified: Secondary | ICD-10-CM | POA: Diagnosis not present

## 2019-10-31 HISTORY — PX: COLONOSCOPY WITH PROPOFOL: SHX5780

## 2019-10-31 HISTORY — DX: Presence of dental prosthetic device (complete) (partial): Z97.2

## 2019-10-31 HISTORY — DX: Family history of other specified conditions: Z84.89

## 2019-10-31 HISTORY — DX: Hypothyroidism, unspecified: E03.9

## 2019-10-31 SURGERY — COLONOSCOPY WITH PROPOFOL
Anesthesia: General | Site: Rectum

## 2019-10-31 MED ORDER — PROPOFOL 10 MG/ML IV BOLUS
INTRAVENOUS | Status: DC | PRN
  Administered 2019-10-31: 40 mg via INTRAVENOUS
  Administered 2019-10-31: 150 mg via INTRAVENOUS
  Administered 2019-10-31: 40 mg via INTRAVENOUS
  Administered 2019-10-31: 50 mg via INTRAVENOUS

## 2019-10-31 MED ORDER — SODIUM CHLORIDE 0.9 % IV SOLN: INTRAVENOUS | Status: DC

## 2019-10-31 MED ORDER — STERILE WATER FOR IRRIGATION IR SOLN
Status: DC | PRN
  Administered 2019-10-31: .05 mL

## 2019-10-31 MED ORDER — LACTATED RINGERS IV SOLN: INTRAVENOUS | Status: DC

## 2019-10-31 MED ORDER — BUPIVACAINE-EPINEPHRINE (PF) 0.5% -1:200000 IJ SOLN
INTRAMUSCULAR | Status: AC
  Filled 2019-10-31: qty 30

## 2019-10-31 MED ORDER — LIDOCAINE HCL (CARDIAC) PF 100 MG/5ML IV SOSY
PREFILLED_SYRINGE | INTRAVENOUS | Status: DC | PRN
  Administered 2019-10-31: 30 mg via INTRAVENOUS

## 2019-10-31 SURGICAL SUPPLY — 24 items
CLIP HMST 235XBRD CATH ROT (MISCELLANEOUS) IMPLANT
CLIP RESOLUTION 360 11X235 (MISCELLANEOUS)
ELECT REM PT RETURN 9FT ADLT (ELECTROSURGICAL)
ELECTRODE REM PT RTRN 9FT ADLT (ELECTROSURGICAL) IMPLANT
FCP ESCP3.2XJMB 240X2.8X (MISCELLANEOUS)
FORCEPS BIOP RAD 4 LRG CAP 4 (CUTTING FORCEPS) IMPLANT
FORCEPS BIOP RJ4 240 W/NDL (MISCELLANEOUS)
FORCEPS ESCP3.2XJMB 240X2.8X (MISCELLANEOUS) IMPLANT
GOWN CVR UNV OPN BCK APRN NK (MISCELLANEOUS) ×2 IMPLANT
GOWN ISOL THUMB LOOP REG UNIV (MISCELLANEOUS) ×4
INJECTOR VARIJECT VIN23 (MISCELLANEOUS) IMPLANT
KIT DEFENDO VALVE AND CONN (KITS) IMPLANT
KIT ENDO PROCEDURE OLY (KITS) ×3 IMPLANT
MANIFOLD NEPTUNE II (INSTRUMENTS) ×3 IMPLANT
MARKER SPOT ENDO TATTOO 5ML (MISCELLANEOUS) IMPLANT
PROBE APC STR FIRE (PROBE) IMPLANT
RETRIEVER NET ROTH 2.5X230 LF (MISCELLANEOUS) IMPLANT
SNARE SHORT THROW 13M SML OVAL (MISCELLANEOUS) IMPLANT
SNARE SHORT THROW 30M LRG OVAL (MISCELLANEOUS) IMPLANT
SNARE SNG USE RND 15MM (INSTRUMENTS) IMPLANT
SPOT EX ENDOSCOPIC TATTOO (MISCELLANEOUS)
TRAP ETRAP POLY (MISCELLANEOUS) IMPLANT
VARIJECT INJECTOR VIN23 (MISCELLANEOUS)
WATER STERILE IRR 250ML POUR (IV SOLUTION) ×3 IMPLANT

## 2019-10-31 NOTE — Anesthesia Preprocedure Evaluation (Signed)
Anesthesia Evaluation  Patient identified by MRN, date of birth, ID band Patient awake    History of Anesthesia Complications Negative for: history of anesthetic complications  Airway Mallampati: II  TM Distance: >3 FB Neck ROM: Full    Dental no notable dental hx.    Pulmonary former smoker,    Pulmonary exam normal        Cardiovascular Exercise Tolerance: Good hypertension, Normal cardiovascular exam     Neuro/Psych negative neurological ROS     GI/Hepatic negative GI ROS, Neg liver ROS,   Endo/Other  Hypothyroidism   Renal/GU negative Renal ROS     Musculoskeletal   Abdominal   Peds  Hematology   Anesthesia Other Findings   Reproductive/Obstetrics                            Anesthesia Physical Anesthesia Plan  ASA: II  Anesthesia Plan: General   Post-op Pain Management:    Induction: Intravenous  PONV Risk Score and Plan: 3 and Propofol infusion, TIVA and Treatment may vary due to age or medical condition  Airway Management Planned: Nasal Cannula and Natural Airway  Additional Equipment: None  Intra-op Plan:   Post-operative Plan:   Informed Consent: I have reviewed the patients History and Physical, chart, labs and discussed the procedure including the risks, benefits and alternatives for the proposed anesthesia with the patient or authorized representative who has indicated his/her understanding and acceptance.       Plan Discussed with: CRNA  Anesthesia Plan Comments:         Anesthesia Quick Evaluation

## 2019-10-31 NOTE — Anesthesia Postprocedure Evaluation (Signed)
Anesthesia Post Note  Patient: [name]  Procedure(s) Performed: COLONOSCOPY WITH PROPOFOL (N/A Rectum)     Patient location during evaluation: PACU Anesthesia Type: General Level of consciousness: awake and alert Pain management: pain level controlled Vital Signs Assessment: post-procedure vital signs reviewed and stable Respiratory status: spontaneous breathing, nonlabored ventilation, respiratory function stable and patient connected to nasal cannula oxygen Cardiovascular status: blood pressure returned to baseline and stable Postop Assessment: no apparent nausea or vomiting Anesthetic complications: no   No complications documented.  Adele Barthel Braden Cimo

## 2019-10-31 NOTE — Anesthesia Procedure Notes (Signed)
Date/Time: 10/31/2019 11:02 AM Performed by: Cameron Ali, CRNA Pre-anesthesia Checklist: Patient identified, Emergency Drugs available, Suction available, Timeout performed and Patient being monitored Patient Re-evaluated:Patient Re-evaluated prior to induction Oxygen Delivery Method: Nasal cannula Placement Confirmation: positive ETCO2

## 2019-10-31 NOTE — Op Note (Signed)
[facility] Gastroenterology Patient Name: [name] Procedure Date: 10/31/2019 11:04 AM MRN: [id number] Account #: [id number] Date of Birth: March 12, 1961 Admit Type: Outpatient Age: 59 Room: [facility] OR ROOM 01 Gender: Female Note Status: Finalized Procedure:             Colonoscopy Indications:           Screening for colorectal malignant neoplasm Providers:             [name] MD, MD Referring MD:          [name], MD (Referring MD) Medicines:             Propofol per Anesthesia Complications:         No immediate complications. Procedure:             Pre-Anesthesia Assessment:                        - Prior to the procedure, a History and Physical was                         performed, and patient medications and allergies were                         reviewed. The patient's tolerance of previous                         anesthesia was also reviewed. The risks and benefits                         of the procedure and the sedation options and risks                         were discussed with the patient. All questions were                         answered, and informed consent was obtained. Prior                         Anticoagulants: The patient has taken no previous                         anticoagulant or antiplatelet agents. ASA Grade                         Assessment: II - A patient with mild systemic disease.                         After reviewing the risks and benefits, the patient                         was deemed in satisfactory condition to undergo the                         procedure.                        After obtaining informed consent, the colonoscope was  passed under direct vision. Throughout the procedure,                         the patient's blood pressure, pulse, and oxygen                         saturations were monitored continuously. The was                         introduced through the anus and  advanced to the the                         cecum, identified by appendiceal orifice and ileocecal                         valve. The colonoscopy was performed without                         difficulty. The patient tolerated the procedure well.                         The quality of the bowel preparation was excellent. Findings:      The perianal and digital rectal examinations were normal.      Non-bleeding internal hemorrhoids were found during retroflexion. The       hemorrhoids were Grade I (internal hemorrhoids that do not prolapse). Impression:            - Non-bleeding internal hemorrhoids.                        - No specimens collected. Recommendation:        - Discharge patient to home.                        - Resume previous diet.                        - Continue present medications.                        - Repeat colonoscopy in 5 years if polyp adenoma and                         10 years if hyperplastic Procedure Code(s):     --- Professional ---                        [phone], Colonoscopy, flexible; diagnostic, including                         collection of specimen(s) by brushing or washing, when                         performed (separate procedure) Diagnosis Code(s):     --- Professional ---                        Z12.11, Encounter for screening for malignant neoplasm                         of  colon CPT copyright 2019 American Medical Association. All rights reserved. The codes documented in this report are preliminary and upon coder review may  be revised to meet current compliance requirements. [name] MD, MD 10/31/2019 11:22:54 AM This report has been signed electronically. Number of Addenda: 0 Note Initiated On: 10/31/2019 11:04 AM Scope Withdrawal Time: 0 hours 7 minutes 54 seconds  Total Procedure Duration: 0 hours 14 minutes 26 seconds  Estimated Blood Loss:  Estimated blood loss: none.      [facility]

## 2019-10-31 NOTE — Transfer of Care (Signed)
Immediate Anesthesia Transfer of Care Note  Patient: [name]  Procedure(s) Performed: COLONOSCOPY WITH PROPOFOL (N/A Rectum)  Patient Location: PACU  Anesthesia Type: General  Level of Consciousness: awake, alert  and patient cooperative  Airway and Oxygen Therapy: Patient Spontanous Breathing and Patient connected to supplemental oxygen  Post-op Assessment: Post-op Vital signs reviewed, Patient's Cardiovascular Status Stable, Respiratory Function Stable, Patent Airway and No signs of Nausea or vomiting  Post-op Vital Signs: Reviewed and stable  Complications: No complications documented.

## 2019-10-31 NOTE — H&P (Signed)
Stacey Lame, MD [address] [name], [name] Phone: [phone] Fax : [phone]  Primary Care Physician:  [name], MD Primary Gastroenterologist:  [name]  Pre-Procedure History & Physical: HPI:  [name] is a 59 y.o. female is here for a screening colonoscopy.   Past Medical History:  Diagnosis Date  . Back pain    spinal stenosis  . Family history of adverse reaction to anesthesia    Mother - PONV and woke during surgery (x1)  . Hypertension   . Hypothyroidism   . Presence of dental prosthetic device    dental implant - top left  . Psoriasis    [name]  . Spinal stenosis   . Thyroid disease     Past Surgical History:  Procedure Laterality Date  . ABDOMINAL HYSTERECTOMY  2007   endometriosis, [name]  . VASCULAR SURGERY Left    left leg    Prior to Admission medications   Medication Sig Start Date End Date Taking? Authorizing Provider  buPROPion (WELLBUTRIN XL) 150 MG 24 hr tablet Take 1 tablet (150 mg total) by mouth daily. 07/22/19  Yes [name], MD  Cholecalciferol (DIALYVITE VITAMIN D 5000) 125 MCG (5000 UT) capsule Take 5,000 Units by mouth daily.   Yes [provider]  levothyroxine (SYNTHROID) 88 MCG tablet Take 1 tablet (88 mcg total) by mouth daily. 07/22/19  Yes [name], MD  Omega-3 Fatty Acids (FISH OIL PO) Take by mouth daily.   Yes [provider]  Probiotic Product (TRUBIOTICS PO) Take by mouth daily.   Yes [provider]  valsartan (DIOVAN) 80 MG tablet Take 1 tablet (80 mg total) by mouth daily. 07/22/19  Yes [name], MD  triamcinolone cream (KENALOG) 0.1 % Apply 1 application topically 2 (two) times daily.    [provider]    Allergies as of 10/06/2019  . (No Known Allergies)    Family History  Problem Relation Age of Onset  . Heart disease Father   . Diabetes Paternal Uncle   . Breast cancer Neg Hx     Social History    Socioeconomic History  . Marital status: Married    Spouse name: Not on file  . Number of children: Not on file  . Years of education: Not on file  . Highest education level: Not on file  Occupational History  . Not on file  Tobacco Use  . Smoking status: Former Smoker    Types: Cigarettes    Quit date: 2008    Years since quitting: 13.4  . Smokeless tobacco: Never Used  Vaping Use  . Vaping Use: Never used  Substance and Sexual Activity  . Alcohol use: No  . Drug use: No  . Sexual activity: Not on file  Other Topics Concern  . Not on file  Social History Narrative   Lives in [place] with husband. Has step-daughter 24YO and grandson.      Work - [employer] in [place] - regular      Exercise - walks occasional   Social Determinants of Radio broadcast assistant Strain:   . Difficulty of Paying Living Expenses:   Food Insecurity:   . Worried About Charity fundraiser in the Last Year:   . Arboriculturist in the Last Year:   Transportation Needs:   . Film/video editor (Medical):   [name] Lack  of Transportation (Non-Medical):   Physical Activity:   . Days of Exercise per Week:   . Minutes of Exercise per Session:   Stress:   . Feeling of Stress :   Social Connections:   . Frequency of Communication with Friends and Family:   . Frequency of Social Gatherings with Friends and Family:   . Attends Religious Services:   . Active Member of Clubs or Organizations:   . Attends Archivist Meetings:   [name] Marital Status:   Intimate Partner Violence:   . Fear of Current or Ex-Partner:   . Emotionally Abused:   [name] Physically Abused:   . Sexually Abused:     Review of Systems: See HPI, otherwise negative ROS  Physical Exam: BP (!) 154/89   Pulse 76   Temp 98.1 F (36.7 C) (Temporal)   Ht 5\' 4"  (1.626 m)   Wt 80.3 kg   SpO2 100%   BMI 30.38 kg/m  General:   Alert,  pleasant and cooperative in NAD Head:  Normocephalic and atraumatic. Neck:   Supple; no masses or thyromegaly. Lungs:  Clear throughout to auscultation.    Heart:  Regular rate and rhythm. Abdomen:  Soft, nontender and nondistended. Normal bowel sounds, without guarding, and without rebound.   Neurologic:  Alert and  oriented x4;  grossly normal neurologically.  Impression/Plan: [name] is now here to undergo a screening colonoscopy.  Risks, benefits, and alternatives regarding colonoscopy have been reviewed with the patient.  Questions have been answered.  All parties agreeable.

## 2019-11-03 ENCOUNTER — Encounter: Payer: Self-pay | Admitting: Gastroenterology

## 2019-11-21 ENCOUNTER — Ambulatory Visit: Payer: Managed Care, Other (non HMO) | Admitting: Nurse Practitioner

## 2019-12-17 ENCOUNTER — Ambulatory Visit: Payer: Managed Care, Other (non HMO) | Admitting: Internal Medicine

## 2019-12-19 ENCOUNTER — Telehealth: Payer: Self-pay

## 2019-12-19 NOTE — Telephone Encounter (Signed)
Confirmed and screened for 12-23-19 ov. 

## 2019-12-23 ENCOUNTER — Encounter: Payer: Self-pay | Admitting: Internal Medicine

## 2019-12-23 ENCOUNTER — Ambulatory Visit: Payer: Managed Care, Other (non HMO) | Admitting: Internal Medicine

## 2019-12-23 ENCOUNTER — Other Ambulatory Visit: Payer: Self-pay

## 2019-12-23 DIAGNOSIS — E039 Hypothyroidism, unspecified: Secondary | ICD-10-CM

## 2019-12-23 DIAGNOSIS — I10 Essential (primary) hypertension: Secondary | ICD-10-CM | POA: Diagnosis not present

## 2019-12-23 DIAGNOSIS — Z6829 Body mass index (BMI) 29.0-29.9, adult: Secondary | ICD-10-CM

## 2019-12-23 DIAGNOSIS — G2581 Restless legs syndrome: Secondary | ICD-10-CM

## 2019-12-23 MED ORDER — ROPINIROLE HCL 0.5 MG PO TABS: ORAL_TABLET | ORAL | 3 refills | Status: DC

## 2019-12-23 MED ORDER — VALSARTAN 160 MG PO TABS: 160.0000 mg | ORAL_TABLET | Freq: Every day | ORAL | 3 refills | Status: DC

## 2019-12-23 NOTE — Progress Notes (Signed)
[facility] [place], [place] [address]  Internal MEDICINE  Office Visit Note  Patient Name: [name]  [id number]  [id number]  Date of Service: 12/31/2019  Chief Complaint  Patient presents with   Follow-up    Brought lab results that were requested by insurance   Hypertension   Leg Problem    Having cramping in the right leg during the middle of the night; recently tried Magnesium/Potassium, cramping subsided a little    Foot Burn    Burning sensation in right foot, below ankle; lasting a few months - not consistent   Quality Metric Gaps    Tetanus    HPI  Pt is here for routine follow up. Recent labs drawn by her work showed worsening in her lipid profile. Pt is able to lose weight about 20 lbs, diet is changed and has been eating a large amount of eggs, she has stopped taking her Requip and restless legs are back, she is unable to sleep at night.  BP is elevated all the time   Current Medication: Outpatient Encounter Medications as of 12/23/2019  Medication Sig   Cholecalciferol (DIALYVITE VITAMIN D 5000) 125 MCG (5000 UT) capsule Take 5,000 Units by mouth daily.   levothyroxine (SYNTHROID) 88 MCG tablet Take 1 tablet (88 mcg total) by mouth daily.   Omega-3 Fatty Acids (FISH OIL PO) Take by mouth daily.   Probiotic Product (TRUBIOTICS PO) Take by mouth daily.   [DISCONTINUED] buPROPion (WELLBUTRIN XL) 150 MG 24 hr tablet Take 1 tablet (150 mg total) by mouth daily.   [DISCONTINUED] valsartan (DIOVAN) 80 MG tablet Take 1 tablet (80 mg total) by mouth daily.   rOPINIRole (REQUIP) 0.5 MG tablet Take one tab po qhs for restless legs   triamcinolone cream (KENALOG) 0.1 % Apply 1 application topically 2 (two) times daily.   valsartan (DIOVAN) 160 MG tablet Take 1 tablet (160 mg total) by mouth daily.   No facility-administered encounter medications on file as of 12/23/2019.    Surgical History: Past Surgical History:  Procedure  Laterality Date   ABDOMINAL HYSTERECTOMY  2007   endometriosis, [name]   COLONOSCOPY WITH PROPOFOL N/A 10/31/2019   Procedure: COLONOSCOPY WITH PROPOFOL;  Surgeon: [name], MD;  Location: [place];  Service: Endoscopy;  Laterality: N/A;  priority 4   VASCULAR SURGERY Left    left leg    Medical History: Past Medical History:  Diagnosis Date   Back pain    spinal stenosis   Family history of adverse reaction to anesthesia    Mother - PONV and woke during surgery (x1)   Hypertension    Hypothyroidism    Presence of dental prosthetic device    dental implant - top left   Psoriasis    [name]   Spinal stenosis    Thyroid disease     Family History: Family History  Problem Relation Age of Onset   Heart disease Father    Diabetes Paternal Uncle    Breast cancer Neg Hx     Social History   Socioeconomic History   Marital status: Married    Spouse name: Not on file   Number of children: Not on file   Years of education: Not on file   Highest education level: Not on file  Occupational History   Not on file  Tobacco Use   Smoking status: Former Smoker    Types: Cigarettes    Quit date: 2008  Years since quitting: 13.6   Smokeless tobacco: Never Used  Vaping Use   Vaping Use: Never used  Substance and Sexual Activity   Alcohol use: No   Drug use: No   Sexual activity: Not on file  Other Topics Concern   Not on file  Social History Narrative   Lives in [place] with husband. Has step-daughter 24YO and grandson.      Work - [employer] in [place] - regular      Exercise - walks occasional   Social Determinants of Radio broadcast assistant Strain:    Difficulty of Paying Living Expenses:   Food Insecurity:    Worried About Charity fundraiser in the Last Year:    Arboriculturist in the Last Year:   Transportation Needs:    Film/video editor (Medical):    Lack of Transportation  (Non-Medical):   Physical Activity:    Days of Exercise per Week:    Minutes of Exercise per Session:   Stress:    Feeling of Stress :   Social Connections:    Frequency of Communication with Friends and Family:    Frequency of Social Gatherings with Friends and Family:    Attends Religious Services:    Active Member of Clubs or Organizations:    Attends Music therapist:    Marital Status:   Intimate Partner Violence:    Fear of Current or Ex-Partner:    Emotionally Abused:    Physically Abused:    Sexually Abused:     Review of Systems  Constitutional: Negative for chills, diaphoresis and fatigue.  HENT: Negative for ear pain, postnasal drip and sinus pressure.   Eyes: Negative for photophobia, discharge, redness, itching and visual disturbance.  Respiratory: Negative for cough, shortness of breath and wheezing.   Cardiovascular: Negative for chest pain, palpitations and leg swelling.  Gastrointestinal: Negative for abdominal pain, constipation, diarrhea, nausea and vomiting.  Genitourinary: Negative for dysuria and flank pain.  Musculoskeletal: Negative for arthralgias, back pain, gait problem and neck pain.  Skin: Negative for color change.  Allergic/Immunologic: Negative for environmental allergies and food allergies.  Neurological: Negative for dizziness and headaches.       Restless legs   Hematological: Does not bruise/bleed easily.  Psychiatric/Behavioral: Negative for agitation, behavioral problems (depression) and hallucinations.   Vital Signs: BP (!) 143/89    Pulse 61    Temp 97.6 F (36.4 C)    Resp 16    Ht 5\' 4"  (1.626 m)    Wt 173 lb 6.4 oz (78.7 kg)    SpO2 99%    BMI 29.76 kg/m    Physical Exam Constitutional:      General: She is not in acute distress.    Appearance: She is well-developed. She is not diaphoretic.  HENT:     Head: Normocephalic and atraumatic.     Mouth/Throat:     Pharynx: No oropharyngeal exudate.  Eyes:      Pupils: Pupils are equal, round, and reactive to light.  Neck:     Thyroid: No thyromegaly.     Vascular: No JVD.     Trachea: No tracheal deviation.  Cardiovascular:     Rate and Rhythm: Normal rate and regular rhythm.     Heart sounds: Normal heart sounds. No murmur heard.  No friction rub. No gallop.   Pulmonary:     Effort: Pulmonary effort is normal. No respiratory  distress.     Breath sounds: No wheezing or rales.  Chest:     Chest wall: No tenderness.  Abdominal:     General: Bowel sounds are normal.     Palpations: Abdomen is soft.  Musculoskeletal:        General: Normal range of motion.     Cervical back: Normal range of motion and neck supple.  Lymphadenopathy:     Cervical: No cervical adenopathy.  Skin:    General: Skin is warm and dry.  Neurological:     Mental Status: She is alert and oriented to person, place, and time.     Cranial Nerves: No cranial nerve deficit.  Psychiatric:        Behavior: Behavior normal.        Thought Content: Thought content normal.        Judgment: Judgment normal.    Assessment/Plan: 1. Essential hypertension, benign - Increase diovan for now.  - valsartan (DIOVAN) 160 MG tablet; Take 1 tablet (160 mg total) by mouth daily.  Dispense: 90 tablet; Refill: 3  2. Hypothyroidism, unspecified type - Continue Synthroid   3. Restless leg syndrome - Restart Requip  - rOPINIRole (REQUIP) 0.5 MG tablet; Take one tab po qhs for restless legs  Dispense: 90 tablet; Refill: 3  4. Adult BMI 29.0-29.9 kg/sq m - Continue intermittent fasting with calorie restriction   General Counseling: Chavonne verbalizes understanding of the findings of todays visit and agrees with plan of treatment. I have discussed any further diagnostic evaluation that may be needed or ordered today. We also reviewed her medications today. she has been encouraged to call the office with any questions or concerns that should arise related to todays visit.  Meds  ordered this encounter  Medications   rOPINIRole (REQUIP) 0.5 MG tablet    Sig: Take one tab po qhs for restless legs    Dispense:  90 tablet    Refill:  3   valsartan (DIOVAN) 160 MG tablet    Sig: Take 1 tablet (160 mg total) by mouth daily.    Dispense:  90 tablet    Refill:  3    Total time spent:35 Minutes Time spent includes review of chart, medications, test results, and follow up plan with the patient.    Dr [name] Internal medicine

## 2019-12-24 NOTE — Progress Notes (Signed)
Scanned in outside [place] labs. [name]

## 2020-01-05 ENCOUNTER — Other Ambulatory Visit: Payer: Self-pay | Admitting: Internal Medicine

## 2020-01-05 DIAGNOSIS — Z1231 Encounter for screening mammogram for malignant neoplasm of breast: Secondary | ICD-10-CM

## 2020-01-07 ENCOUNTER — Ambulatory Visit
Admission: RE | Admit: 2020-01-07 | Discharge: 2020-01-07 | Disposition: A | Discharge status: Home / Self Care | Payer: Managed Care, Other (non HMO) | Source: Ambulatory Visit | Attending: Internal Medicine | Admitting: Internal Medicine

## 2020-01-07 ENCOUNTER — Other Ambulatory Visit: Payer: Self-pay

## 2020-01-07 DIAGNOSIS — Z1231 Encounter for screening mammogram for malignant neoplasm of breast: Secondary | ICD-10-CM | POA: Diagnosis present

## 2020-02-03 ENCOUNTER — Ambulatory Visit: Payer: Managed Care, Other (non HMO) | Admitting: Internal Medicine

## 2020-02-24 ENCOUNTER — Ambulatory Visit: Payer: Managed Care, Other (non HMO) | Admitting: Hospice and Palliative Medicine

## 2020-02-24 ENCOUNTER — Other Ambulatory Visit: Payer: Self-pay

## 2020-02-24 ENCOUNTER — Encounter: Payer: Self-pay | Admitting: Hospice and Palliative Medicine

## 2020-02-24 DIAGNOSIS — E782 Mixed hyperlipidemia: Secondary | ICD-10-CM | POA: Diagnosis not present

## 2020-02-24 DIAGNOSIS — I10 Essential (primary) hypertension: Secondary | ICD-10-CM

## 2020-02-24 DIAGNOSIS — R252 Cramp and spasm: Secondary | ICD-10-CM

## 2020-02-24 DIAGNOSIS — G2581 Restless legs syndrome: Secondary | ICD-10-CM

## 2020-02-24 NOTE — Progress Notes (Signed)
[facility] [place], [place] [address]  Internal MEDICINE  Office Visit Note  Patient Name: [name]  [id number]  [id number]  Date of Service: 02/25/2020  Chief Complaint  Patient presents with  . Follow-up  . Hypertension  . controlled substance policy    reviewed     HPI Patient is here for routine follow-up BP much more controlled on increased dose--no negative side effects associated with increased dose Leg cramping some better--still occurs at bedtime but is occurring less frequently since started back requip She continues to work on her weight and healthy lifestyle--currently following Whole 30 diet plan--she is down 2 pounds today   Current Medication: Outpatient Encounter Medications as of 02/24/2020  Medication Sig  . Cholecalciferol (DIALYVITE VITAMIN D 5000) 125 MCG (5000 UT) capsule Take 5,000 Units by mouth daily.  [name] levothyroxine (SYNTHROID) 88 MCG tablet Take 1 tablet (88 mcg total) by mouth daily.  . Omega-3 Fatty Acids (FISH OIL PO) Take by mouth daily.  . Probiotic Product (TRUBIOTICS PO) Take by mouth daily.  [name] rOPINIRole (REQUIP) 0.5 MG tablet Take one tab po qhs for restless legs  . valsartan (DIOVAN) 160 MG tablet Take 1 tablet (160 mg total) by mouth daily.   No facility-administered encounter medications on file as of 02/24/2020.    Surgical History: Past Surgical History:  Procedure Laterality Date  . ABDOMINAL HYSTERECTOMY  2007   endometriosis, [name]  . COLONOSCOPY WITH PROPOFOL N/A 10/31/2019   Procedure: COLONOSCOPY WITH PROPOFOL;  Surgeon: [name], MD;  Location: [facility];  Service: Endoscopy;  Laterality: N/A;  priority 4  . VASCULAR SURGERY Left    left leg    Medical History: Past Medical History:  Diagnosis Date  . Back pain    spinal stenosis  . Family history of adverse reaction to anesthesia    Mother - PONV and woke during surgery (x1)  . Hypertension   .  Hypothyroidism   . Presence of dental prosthetic device    dental implant - top left  . Psoriasis    [name]  . Spinal stenosis   . Thyroid disease     Family History: Family History  Problem Relation Age of Onset  . Heart disease Father   . Diabetes Paternal Uncle   . Breast cancer Neg Hx     Social History   Socioeconomic History  . Marital status: Married    Spouse name: Not on file  . Number of children: Not on file  . Years of education: Not on file  . Highest education level: Not on file  Occupational History  . Not on file  Tobacco Use  . Smoking status: Former Smoker    Types: Cigarettes    Quit date: 2008    Years since quitting: 13.7  . Smokeless tobacco: Never Used  Vaping Use  . Vaping Use: Never used  Substance and Sexual Activity  . Alcohol use: No  . Drug use: No  . Sexual activity: Not on file  Other Topics Concern  . Not on file  Social History Narrative   Lives in [place] with husband. Has step-daughter 24YO and grandson.      Work - [employer] in [place] - regular      Exercise - walks occasional   Social Determinants of Health   Financial Resource Strain:   . Difficulty of Paying Living Expenses: Not on file  Food  Insecurity:   . Worried About Charity fundraiser in the Last Year: Not on file  . Ran Out of Food in the Last Year: Not on file  Transportation Needs:   . Lack of Transportation (Medical): Not on file  . Lack of Transportation (Non-Medical): Not on file  Physical Activity:   . Days of Exercise per Week: Not on file  . Minutes of Exercise per Session: Not on file  Stress:   . Feeling of Stress : Not on file  Social Connections:   . Frequency of Communication with Friends and Family: Not on file  . Frequency of Social Gatherings with Friends and Family: Not on file  . Attends Religious Services: Not on file  . Active Member of Clubs or Organizations: Not on file  . Attends Archivist Meetings:  Not on file  . Marital Status: Not on file  Intimate Partner Violence:   . Fear of Current or Ex-Partner: Not on file  . Emotionally Abused: Not on file  . Physically Abused: Not on file  . Sexually Abused: Not on file   Review of Systems  Constitutional: Negative for chills, diaphoresis and fatigue.  HENT: Negative for ear pain, postnasal drip and sinus pressure.   Eyes: Negative for photophobia, discharge, redness, itching and visual disturbance.  Respiratory: Negative for cough, shortness of breath and wheezing.   Cardiovascular: Negative for chest pain, palpitations and leg swelling.  Gastrointestinal: Negative for abdominal pain, constipation, diarrhea, nausea and vomiting.  Genitourinary: Negative for dysuria and flank pain.  Musculoskeletal: Negative for arthralgias, back pain, gait problem and neck pain.       Occasional leg cramps in the evening  Skin: Negative for color change.  Allergic/Immunologic: Negative for environmental allergies and food allergies.  Neurological: Negative for dizziness and headaches.  Hematological: Does not bruise/bleed easily.  Psychiatric/Behavioral: Negative for agitation, behavioral problems (depression) and hallucinations.     Vital Signs: BP 120/84   Pulse 78   Resp 16   Ht 5\' 4"  (1.626 m)   Wt 171 lb 12.8 oz (77.9 kg)   SpO2 100%   BMI 29.49 kg/m    Physical Exam Vitals reviewed.  Constitutional:      Appearance: Normal appearance.  Cardiovascular:     Rate and Rhythm: Normal rate and regular rhythm.     Pulses: Normal pulses.     Heart sounds: Normal heart sounds.  Pulmonary:     Effort: Pulmonary effort is normal.     Breath sounds: Normal breath sounds.  Abdominal:     General: Abdomen is flat.     Palpations: Abdomen is soft.  Musculoskeletal:        General: Normal range of motion.     Cervical back: Normal range of motion.  Skin:    General: Skin is warm.  Neurological:     General: No focal deficit present.      Mental Status: She is alert and oriented to person, place, and time. Mental status is at baseline.  Psychiatric:        Mood and Affect: Mood normal.        Behavior: Behavior normal.        Thought Content: Thought content normal.    Assessment/Plan: 1. Essential hypertension, benign BP and HR more controlled today, continue with current therapy and routine monitoring  2. Mixed hyperlipidemia Levels were elevated on last check, she would like to have these rechecked as she cannot remember if she  was fasting - Lipid Panel With LDL/HDL Ratio  3. RLS (restless legs syndrome) Will review electrolyte levels for abnormalities potentially causing cramping sensations - Comprehensive Metabolic Panel (CMET)  General Counseling: Jeriyah verbalizes understanding of the findings of todays visit and agrees with plan of treatment. I have discussed any further diagnostic evaluation that may be needed or ordered today. We also reviewed her medications today. she has been encouraged to call the office with any questions or concerns that should arise related to todays visit.    Orders Placed This Encounter  Procedures  . Comprehensive Metabolic Panel (CMET)  . Lipid Panel With LDL/HDL Ratio    Time spent:30 Minutes Time spent includes review of chart, medications, test results and follow-up plan with the patient.  This patient was seen by Theodoro Grist AGNP-C in Collaboration with Dr [name] as a part of collaborative care agreement     Tanna Furry. Lavora Brisbon AGNP-C Internal medicine

## 2020-02-25 ENCOUNTER — Encounter: Payer: Self-pay | Admitting: Hospice and Palliative Medicine

## 2020-03-04 LAB — COMPREHENSIVE METABOLIC PANEL
ALT: 17 IU/L (ref 0–32)
AST: 15 IU/L (ref 0–40)
Albumin/Globulin Ratio: 2 (ref 1.2–2.2)
Albumin: 4.6 g/dL (ref 3.8–4.9)
Alkaline Phosphatase: 90 IU/L (ref 44–121)
BUN/Creatinine Ratio: 22 (ref 9–23)
BUN: 19 mg/dL (ref 6–24)
Bilirubin Total: 0.4 mg/dL (ref 0.0–1.2)
CO2: 25 mmol/L (ref 20–29)
Calcium: 9.7 mg/dL (ref 8.7–10.2)
Chloride: 101 mmol/L (ref 96–106)
Creatinine, Ser: 0.88 mg/dL (ref 0.57–1.00)
GFR calc Af Amer: 85 mL/min/{1.73_m2} (ref >59)
GFR calc non Af Amer: 74 mL/min/{1.73_m2} (ref >59)
Globulin, Total: 2.3 g/dL (ref 1.5–4.5)
Glucose: 85 mg/dL (ref 65–99)
Potassium: 4.5 mmol/L (ref 3.5–5.2)
Sodium: 140 mmol/L (ref 134–144)
Total Protein: 6.9 g/dL (ref 6.0–8.5)

## 2020-03-04 LAB — LIPID PANEL WITH LDL/HDL RATIO
Cholesterol, Total: 245 mg/dL — ABNORMAL HIGH (ref 100–199)
HDL: 70 mg/dL (ref >39)
LDL Chol Calc (NIH): 166 mg/dL — ABNORMAL HIGH (ref 0–99)
LDL/HDL Ratio: 2.4 ratio (ref 0.0–3.2)
Triglycerides: 58 mg/dL (ref 0–149)
VLDL Cholesterol Cal: 9 mg/dL (ref 5–40)

## 2020-03-31 ENCOUNTER — Ambulatory Visit: Payer: Managed Care, Other (non HMO) | Admitting: Hospice and Palliative Medicine

## 2020-04-12 ENCOUNTER — Encounter: Payer: Self-pay | Admitting: Hospice and Palliative Medicine

## 2020-04-12 ENCOUNTER — Ambulatory Visit: Payer: Managed Care, Other (non HMO) | Admitting: Hospice and Palliative Medicine

## 2020-04-12 ENCOUNTER — Other Ambulatory Visit: Payer: Self-pay

## 2020-04-12 DIAGNOSIS — E782 Mixed hyperlipidemia: Secondary | ICD-10-CM

## 2020-04-12 DIAGNOSIS — G2581 Restless legs syndrome: Secondary | ICD-10-CM | POA: Diagnosis not present

## 2020-04-12 DIAGNOSIS — E039 Hypothyroidism, unspecified: Secondary | ICD-10-CM | POA: Diagnosis not present

## 2020-04-12 DIAGNOSIS — R252 Cramp and spasm: Secondary | ICD-10-CM | POA: Diagnosis not present

## 2020-04-12 DIAGNOSIS — Z23 Encounter for immunization: Secondary | ICD-10-CM

## 2020-04-12 NOTE — Progress Notes (Signed)
[facility] [place], [place] [address]  Internal MEDICINE  Office Visit Note  Patient Name: [name]  [id number]  [id number]  Date of Service: 04/15/2020  Chief Complaint  Patient presents with  . Follow-up    lab results, leg cramps still going on and more frequent about every night  . Hypertension  . policy update form    received    HPI Patient is here for routine follow-up Reviewed labs due to leg cramping-electrolytes within normal range Cramping is still occurring at night, cramping bilateral calves, interestingly she did not have any cramping when she was vacationing at the beach--possibly related to stress/matress? Was started on Requip--somewhat helpful at reliving symptoms, during the night will get up and walk around to work out cramping--is able to fall back to sleep Has noticed that since increasing her dose of Diovan the cramping since to have worsened and is happening more frequently--basically every night she is having symptoms and having to get up and walk around  Mammogram and colonoscopy screenings completed this year  Current Medication: Outpatient Encounter Medications as of 04/12/2020  Medication Sig  . Cholecalciferol (DIALYVITE VITAMIN D 5000) 125 MCG (5000 UT) capsule Take 5,000 Units by mouth daily.  [name] levothyroxine (SYNTHROID) 88 MCG tablet Take 1 tablet (88 mcg total) by mouth daily.  . Omega-3 Fatty Acids (FISH OIL PO) Take by mouth daily.  [name] rOPINIRole (REQUIP) 0.5 MG tablet Take one tab po qhs for restless legs  . valsartan (DIOVAN) 160 MG tablet Take 1 tablet (160 mg total) by mouth daily.  . Probiotic Product (TRUBIOTICS PO) Take by mouth daily. (Patient not taking: Reported on 04/12/2020)   No facility-administered encounter medications on file as of 04/12/2020.    Surgical History: Past Surgical History:  Procedure Laterality Date  . ABDOMINAL HYSTERECTOMY  2007   endometriosis, [name]  .  COLONOSCOPY WITH PROPOFOL N/A 10/31/2019   Procedure: COLONOSCOPY WITH PROPOFOL;  Surgeon: [name], MD;  Location: [facility];  Service: Endoscopy;  Laterality: N/A;  priority 4  . VASCULAR SURGERY Left    left leg    Medical History: Past Medical History:  Diagnosis Date  . Back pain    spinal stenosis  . Family history of adverse reaction to anesthesia    Mother - PONV and woke during surgery (x1)  . Hypertension   . Hypothyroidism   . Presence of dental prosthetic device    dental implant - top left  . Psoriasis    [name]  . Spinal stenosis   . Thyroid disease     Family History: Family History  Problem Relation Age of Onset  . Heart disease Father   . Diabetes Paternal Uncle   . Breast cancer Neg Hx     Social History   Socioeconomic History  . Marital status: Married    Spouse name: Not on file  . Number of children: Not on file  . Years of education: Not on file  . Highest education level: Not on file  Occupational History  . Not on file  Tobacco Use  . Smoking status: Former Smoker    Types: Cigarettes    Quit date: 2008    Years since quitting: 13.9  . Smokeless tobacco: Never Used  Vaping Use  . Vaping Use: Never used  Substance and Sexual Activity  . Alcohol use: No  . Drug use: No  . Sexual activity: Not on file  Other Topics  Concern  . Not on file  Social History Narrative   Lives in [place] with husband. Has step-daughter 24YO and grandson.      Work - [employer] in [place] - regular      Exercise - walks occasional   Social Determinants of Health   Financial Resource Strain:   . Difficulty of Paying Living Expenses: Not on file  Food Insecurity:   . Worried About Charity fundraiser in the Last Year: Not on file  . Ran Out of Food in the Last Year: Not on file  Transportation Needs:   . Lack of Transportation (Medical): Not on file  . Lack of Transportation (Non-Medical): Not on file  Physical Activity:    . Days of Exercise per Week: Not on file  . Minutes of Exercise per Session: Not on file  Stress:   . Feeling of Stress : Not on file  Social Connections:   . Frequency of Communication with Friends and Family: Not on file  . Frequency of Social Gatherings with Friends and Family: Not on file  . Attends Religious Services: Not on file  . Active Member of Clubs or Organizations: Not on file  . Attends Archivist Meetings: Not on file  . Marital Status: Not on file  Intimate Partner Violence:   . Fear of Current or Ex-Partner: Not on file  . Emotionally Abused: Not on file  . Physically Abused: Not on file  . Sexually Abused: Not on file   Review of Systems  Constitutional: Negative for chills, diaphoresis and fatigue.  HENT: Negative for congestion, hearing loss, nosebleeds, postnasal drip, rhinorrhea, sinus pressure, sinus pain and voice change.   Eyes: Negative for photophobia and visual disturbance.  Respiratory: Negative for cough, chest tightness and shortness of breath.   Cardiovascular: Negative for chest pain, palpitations and leg swelling.  Gastrointestinal: Negative for abdominal pain, constipation, diarrhea, nausea and vomiting.  Endocrine: Negative for polydipsia, polyphagia and polyuria.  Genitourinary: Negative for difficulty urinating, dysuria, flank pain, frequency, urgency and vaginal bleeding.  Musculoskeletal: Negative for arthralgias, gait problem, joint swelling, myalgias and neck pain.       Bilateral leg cramping  Skin: Negative for color change, pallor, rash and wound.  Allergic/Immunologic: Negative for environmental allergies.  Neurological: Negative for dizziness, syncope, speech difficulty, weakness, light-headedness and headaches.  Psychiatric/Behavioral: Negative for behavioral problems and sleep disturbance. The patient is not nervous/anxious.     Vital Signs: BP 138/86   Pulse 80   Temp (!) 97.4 F (36.3 C)   Resp 16   Ht 5\' 4"   (1.626 m)   Wt 173 lb 12.8 oz (78.8 kg)   SpO2 99%   BMI 29.83 kg/m    Physical Exam Vitals reviewed.  Constitutional:      Appearance: Normal appearance.  Cardiovascular:     Rate and Rhythm: Normal rate and regular rhythm.     Pulses: Normal pulses.     Heart sounds: Normal heart sounds.  Pulmonary:     Effort: Pulmonary effort is normal.     Breath sounds: Normal breath sounds.  Abdominal:     General: Abdomen is flat.     Palpations: Abdomen is soft.  Musculoskeletal:        General: Normal range of motion.     Cervical back: Normal range of motion.  Skin:    General: Skin is warm.  Neurological:     General: No focal  deficit present.     Mental Status: She is alert and oriented to person, place, and time. Mental status is at baseline.  Psychiatric:        Mood and Affect: Mood normal.        Thought Content: Thought content normal.        Judgment: Judgment normal.    Assessment/Plan: 1. Mixed hyperlipidemia Abnormal lipid panel, discussed healthy food options with her and encouraged to incorporate exercise into her daily routine Once leg cramps have been controlled may need to discuss statin therapy for hyperlipidemia control  2. Leg cramps Will review further labs for potential cause of leg cramping - CBC w/Diff/Platelet - Potassium - B12 - Vitamin D (25 hydroxy) - Fe+TIBC+Fer  3. RLS (restless legs syndrome) Continue with ropinirole at this time until clear etiology  4. Acquired hypothyroidism Will review updated thyroid levels and adjust therapy dose as indicated - TSH + free T4  5. Need for immunization against tetanus alone Sent to pharmacy to update immunization - Td : Tetanus/diphtheria >7yo Preservative  free  General Counseling: Nealie verbalizes understanding of the findings of todays visit and agrees with plan of treatment. I have discussed any further diagnostic evaluation that may be needed or ordered today. We also reviewed her  medications today. she has been encouraged to call the office with any questions or concerns that should arise related to todays visit.    Orders Placed This Encounter  Procedures  . Td : Tetanus/diphtheria >7yo Preservative  free  . CBC w/Diff/Platelet  . Potassium  . B12  . Vitamin D (25 hydroxy)  . TSH + free T4  . Fe+TIBC+Fer      Time spent: 30 Minutes Time spent includes review of chart, medications, test results and follow-up plan with the patient.  This patient was seen by Theodoro Grist AGNP-C in Collaboration with Dr [name] as a part of collaborative care agreement     Tanna Furry. Jahniya Duzan AGNP-C Internal medicine

## 2020-04-15 ENCOUNTER — Other Ambulatory Visit: Payer: Self-pay

## 2020-04-15 ENCOUNTER — Encounter: Payer: Self-pay | Admitting: Hospice and Palliative Medicine

## 2020-04-15 MED ORDER — TETANUS-DIPHTH-ACELL PERTUSSIS 5-2.5-18.5 LF-MCG/0.5 IM SUSP: 0.5000 mL | Freq: Once | INTRAMUSCULAR | 0 refills | Status: AC

## 2020-04-15 NOTE — Patient Instructions (Signed)
Preventing High Cholesterol Cholesterol is a white, waxy substance similar to fat that the human body needs to help build cells. The liver makes all the cholesterol that a person's body needs. Having high cholesterol (hypercholesterolemia) increases a person's risk for heart disease and stroke. Extra (excess) cholesterol comes from the food the person eats. High cholesterol can often be prevented with diet and lifestyle changes. If you already have high cholesterol, you can control it with diet and lifestyle changes and with medicine. How can high cholesterol affect me? If you have high cholesterol, deposits (plaques) may build up on the walls of your arteries. The arteries are the blood vessels that carry blood away from your heart. Plaques make the arteries narrower and stiffer. This can limit or block blood flow and cause blood clots to form. Blood clots:  Are tiny balls of cells that form in your blood.  Can move to the heart or brain, causing a heart attack or stroke. Plaques in arteries greatly increase your risk for heart attack and stroke.Making diet and lifestyle changes can reduce your risk for these conditions that may threaten your life. What can increase my risk? This condition is more likely to develop in people who:  Eat foods that are high in saturated fat or cholesterol. Saturated fat is mostly found in: ? Foods that contain animal fat, such as red meat and some dairy products. ? Certain fatty foods made from plants, such as tropical oils.  Are overweight.  Are not getting enough exercise.  Have a family history of high cholesterol. What actions can I take to prevent this? Nutrition   Eat less saturated fat.  Avoid trans fats (partially hydrogenated oils). These are often found in margarine and in some baked goods, fried foods, and snacks bought in packages.  Avoid precooked or cured meat, such as sausages or meat loaves.  Avoid foods and drinks that have added  sugars.  Eat more fruits, vegetables, and whole grains.  Choose healthy sources of protein, such as fish, poultry, lean cuts of red meat, beans, peas, lentils, and nuts.  Choose healthy sources of fat, such as: ? Nuts. ? Vegetable oils, especially olive oil. ? Fish that have healthy fats (omega-3 fatty acids), such as mackerel or salmon. The items listed above may not be a complete list of recommended foods and beverages. Contact a dietitian for more information. Lifestyle  Lose weight if you are overweight. Losing 5-10 lb (2.3-4.5 kg) can help prevent or control high cholesterol. It can also lower your risk for diabetes and high blood pressure. Ask your health care provider to help you with a diet and exercise plan to lose weight safely.  Do not use any products that contain nicotine or tobacco, such as cigarettes, e-cigarettes, and chewing tobacco. If you need help quitting, ask your health care provider.  Limit your alcohol intake. ? Do not drink alcohol if:  Your health care provider tells you not to drink.  You are pregnant, may be pregnant, or are planning to become pregnant. ? If you drink alcohol:  Limit how much you use to:  0-1 drink a day for women.  0-2 drinks a day for men.  Be aware of how much alcohol is in your drink. In the [place] one drink equals one 12 oz bottle of beer (355 mL), one 5 oz glass of wine (148 mL), or one 1 oz glass of hard liquor (44 mL). Activity   Get enough exercise. Each week, do at   least 150 minutes of exercise that takes a medium level of effort (moderate-intensity exercise). ? This is exercise that:  Makes your heart beat faster and makes you breathe harder than usual.  Allows you to still be able to talk. ? You could exercise in short sessions several times a day or longer sessions a few times a week. For example, on 5 days each week, you could walk fast or ride your bike 3 times a day for 10 minutes each time.  Do exercises as told  by your health care provider. Medicines  In addition to diet and lifestyle changes, your health care provider may recommend medicines to help lower cholesterol. This may be a medicine to lower the amount of cholesterol your liver makes. You may need medicine if: ? Diet and lifestyle changes do not lower your cholesterol enough. ? You have high cholesterol and other risk factors for heart disease or stroke.  Take over-the-counter and prescription medicines only as told by your health care provider. General information  Manage your risk factors for high cholesterol. Talk with your health care provider about all your risk factors and how to lower your risk.  Manage other conditions that you have, such as diabetes or high blood pressure (hypertension).  Have blood tests to check your cholesterol levels at regular points in time as told by your health care provider.  Keep all follow-up visits as told by your health care provider. This is important. Where to find more information  American Heart Association: www.heart.org  National Heart, Lung, and Blood Institute: www.nhlbi.nih.gov Summary  High cholesterol increases your risk for heart disease and stroke. By keeping your cholesterol level low, you can reduce your risk for these conditions.  High cholesterol can often be prevented with diet and lifestyle changes.  Work with your health care provider to manage your risk factors, and have your blood tested regularly. This information is not intended to replace advice given to you by your health care provider. Make sure you discuss any questions you have with your health care provider. Document Revised: 08/23/2018 Document Reviewed: 01/08/2016 Elsevier Patient Education  2020 [employer]  

## 2020-04-20 LAB — IRON,TIBC AND FERRITIN PANEL
Ferritin: 91 ng/mL (ref 15–150)
Iron Saturation: 27 % (ref 15–55)
Iron: 68 ug/dL (ref 27–159)
Total Iron Binding Capacity: 251 ug/dL (ref 250–450)
UIBC: 183 ug/dL (ref 131–425)

## 2020-04-21 ENCOUNTER — Encounter: Payer: Self-pay | Admitting: Hospice and Palliative Medicine

## 2020-05-26 ENCOUNTER — Encounter: Payer: Self-pay | Admitting: Hospice and Palliative Medicine

## 2020-05-26 ENCOUNTER — Other Ambulatory Visit: Payer: Self-pay

## 2020-05-26 ENCOUNTER — Ambulatory Visit: Payer: 59 | Admitting: Hospice and Palliative Medicine

## 2020-05-26 VITALS — BP 144/99 | HR 66 | Temp 97.5°F | Resp 16 | Ht 64.0 in | Wt 177.4 lb

## 2020-05-26 DIAGNOSIS — E782 Mixed hyperlipidemia: Secondary | ICD-10-CM | POA: Diagnosis not present

## 2020-05-26 DIAGNOSIS — I10 Essential (primary) hypertension: Secondary | ICD-10-CM

## 2020-05-26 DIAGNOSIS — R252 Cramp and spasm: Secondary | ICD-10-CM

## 2020-05-26 DIAGNOSIS — G629 Polyneuropathy, unspecified: Secondary | ICD-10-CM | POA: Diagnosis not present

## 2020-05-26 MED ORDER — ENALAPRIL MALEATE 5 MG PO TABS: 5.0000 mg | ORAL_TABLET | Freq: Every day | ORAL | 1 refills | Status: DC

## 2020-05-26 NOTE — Progress Notes (Signed)
[facility] [place], [place] [address]  Internal MEDICINE  Office Visit Note  Patient Name: [name]  [id number]  NO:[id number]  Date of Service: 05/26/2020  Chief Complaint  Patient presents with  . Follow-up  . Hypertension    HPI Patient is here for routine follow-up She is feeling well, leg cramping has gotten better, has only had about 3 episodes since our last visit, she has not been taking ropinirole regularly   Discussed her elevated BP today--monitors it home and has been averaging [phone], she is complaining today of a frontal headache, dull aching pain, has been ongoing for about 2-3 days She has been taking 1/2 tablet of valsartan total 80 mg  Has labs drawn but not all active orders were drawn, reviewed iron studies, normal levels, ferritin 91  Also complaining of numbness and tingling to bilateral arms and hands when she is lying in bed at night, is disrupting her sleep at times, has history of spinal stenosis of lumbar spine, 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 enalapril (VASOTEC) 5 MG tablet Take 1 tablet (5 mg total) by mouth daily.  . Cholecalciferol (DIALYVITE VITAMIN D 5000) 125 MCG (5000 UT) capsule Take 5,000 Units by mouth daily.  [name] levothyroxine (SYNTHROID) 88 MCG tablet Take 1 tablet (88 mcg total) by mouth daily.  . Omega-3 Fatty Acids (FISH OIL PO) Take by mouth daily.  . Probiotic Product (TRUBIOTICS PO) Take by mouth daily. (Patient not taking: Reported on 04/12/2020)  . rOPINIRole (REQUIP) 0.5 MG tablet Take one tab po qhs for restless legs  . valsartan (DIOVAN) 160 MG tablet Take 1 tablet (160 mg total) by mouth daily.   No facility-administered encounter medications on file as of 05/26/2020.     Surgical History: Past Surgical History:  Procedure Laterality Date  . ABDOMINAL HYSTERECTOMY  2007   endometriosis, [name]  . COLONOSCOPY WITH PROPOFOL N/A 10/31/2019   Procedure: COLONOSCOPY WITH PROPOFOL;  Surgeon: [name], MD;  Location: [facility];  Service: Endoscopy;  Laterality: N/A;  priority 4  . VASCULAR SURGERY Left    left leg    Medical History: Past Medical History:  Diagnosis Date  . Back pain    spinal stenosis  . Family history of adverse reaction to anesthesia    Mother - PONV and woke during surgery (x1)  . Hypertension   . Hypothyroidism   . Presence of dental prosthetic device    dental implant - top left  . Psoriasis    [name]  . Spinal stenosis   . Thyroid disease     Family History: Family History  Problem Relation Age of Onset  . Heart disease Father   . Diabetes Paternal Uncle   . Breast cancer Neg Hx     Social History   Socioeconomic History  . Marital status: Married    Spouse name: Not on file  . Number of children: Not on file  . Years of education: Not on file  . Highest education level: Not on file  Occupational History  . Not on file  Tobacco Use  . Smoking status: Former Smoker    Types: Cigarettes    Quit date: 2008    Years since quitting: 14.0  . Smokeless tobacco:  Never Used  Vaping Use  . Vaping Use: Never used  Substance and Sexual Activity  . Alcohol use: No  . Drug use: No  . Sexual activity: Not on file  Other Topics Concern  . Not on file  Social History Narrative   Lives in [place] with husband. Has step-daughter 24YO and grandson.      Work - [employer] in [place] - regular      Exercise - walks occasional   Social Determinants of Radio broadcast assistant Strain: Not on file  Food Insecurity: Not on file  Transportation Needs: Not on file  Physical Activity: Not on file  Stress: Not on file  Social Connections: Not on file  Intimate Partner  Violence: Not on file   Review of Systems  Constitutional: Negative for chills, diaphoresis and fatigue.  HENT: Negative for ear pain, postnasal drip and sinus pressure.   Eyes: Negative for photophobia, discharge, redness, itching and visual disturbance.  Respiratory: Negative for cough, shortness of breath and wheezing.   Cardiovascular: Negative for chest pain, palpitations and leg swelling.  Gastrointestinal: Negative for abdominal pain, constipation, diarrhea, nausea and vomiting.  Genitourinary: Negative for dysuria and flank pain.  Musculoskeletal: Negative for arthralgias, back pain, gait problem and neck pain.  Skin: Negative for color change.  Allergic/Immunologic: Negative for environmental allergies and food allergies.  Neurological: Positive for numbness and headaches. Negative for dizziness.       Numbness and tingling-bilateral upper extremities at night  Hematological: Does not bruise/bleed easily.  Psychiatric/Behavioral: Negative for agitation, behavioral problems (depression) and hallucinations.    Vital Signs: BP (!) 144/99   Pulse 66   Temp (!) 97.5 F (36.4 C)   Resp 16   Ht 5\' 4"  (1.626 m)   Wt 177 lb 6.4 oz (80.5 kg)   SpO2 97%   BMI 30.45 kg/m    Physical Exam Vitals reviewed.  Constitutional:      Appearance: Normal appearance. She is normal weight.  Cardiovascular:     Rate and Rhythm: Normal rate and regular rhythm.     Pulses: Normal pulses.     Heart sounds: Normal heart sounds.  Pulmonary:     Effort: Pulmonary effort is normal.     Breath sounds: Normal breath sounds.  Abdominal:     General: Abdomen is flat.     Palpations: Abdomen is soft.  Musculoskeletal:        General: Normal range of motion.     Cervical back: Normal range of motion.  Skin:    General: Skin is warm.  Neurological:     General: No focal deficit present.     Mental Status: She is alert and oriented to person, place, and time. Mental status is at baseline.   Psychiatric:        Mood and Affect: Mood normal.        Behavior: Behavior normal.        Thought Content: Thought content normal.        Judgment: Judgment normal.    Assessment/Plan: 1. Essential hypertension Slow taper of valsartan as she slowly increases dose of enalapril to 10 mg--will follow-up once she has been consistently taking 10 mg enalapril to monitor response to therapy - enalapril (VASOTEC) 5 MG tablet; Take 1 tablet (5 mg total) by mouth daily.  Dispense: 90 tablet; Refill: 1  2. Leg cramps Symptoms have improved since last visit without use of ropinirole,  she is concerned leg cramping is associated with valsartan--potassium normal Will change BP therapy to see if there is a positive response  3. Mixed hyperlipidemia Will continue with discussions at eat visit, again would like to have keg cramping resolved prior to initiating therapy  4. Neuropathy B12 levels ordered and to be drawn--possible due to cervical radiculopathy, no imaging on file, encouraged to start neck exercises, apply heat and consider restarting massage therapy Consider imaging and ortho referral if B12 normal and symptoms persist  General Counseling: Chrissie Noa understanding of the findings of todays visit and agrees with plan of treatment. I have discussed any further diagnostic evaluation that may be needed or ordered today. We also reviewed her medications today. she has been encouraged to call the office with any questions or concerns that should arise related to todays visit.   Meds ordered this encounter  Medications  . enalapril (VASOTEC) 5 MG tablet    Sig: Take 1 tablet (5 mg total) by mouth daily.    Dispense:  90 tablet    Refill:  1    Time spent: 30 Minutes Time spent includes review of chart, medications, test results and follow-up plan with the patient.  This patient was seen by Theodoro Grist AGNP-C in Collaboration with Dr [name] as a part of collaborative care  agreement     Tanna Furry. Caden Fukushima AGNP-C Internal medicine

## 2020-06-10 ENCOUNTER — Encounter: Payer: Self-pay | Admitting: Hospice and Palliative Medicine

## 2020-06-11 ENCOUNTER — Encounter: Payer: Self-pay | Admitting: Hospice and Palliative Medicine

## 2020-06-11 ENCOUNTER — Ambulatory Visit: Payer: 59 | Admitting: Hospice and Palliative Medicine

## 2020-06-11 VITALS — BP 132/83 | HR 76 | Temp 97.6°F | Resp 16 | Ht 64.0 in | Wt 176.2 lb

## 2020-06-11 DIAGNOSIS — W19XXXA Unspecified fall, initial encounter: Secondary | ICD-10-CM

## 2020-06-11 DIAGNOSIS — R252 Cramp and spasm: Secondary | ICD-10-CM

## 2020-06-11 DIAGNOSIS — I10 Essential (primary) hypertension: Secondary | ICD-10-CM | POA: Diagnosis not present

## 2020-06-11 LAB — POTASSIUM: Potassium: 4.3 mmol/L (ref 3.5–5.2)

## 2020-06-11 LAB — CBC WITH DIFFERENTIAL/PLATELET
Basophils Absolute: 0 10*3/uL (ref 0.0–0.2)
Basos: 0 %
EOS (ABSOLUTE): 0.1 10*3/uL (ref 0.0–0.4)
Eos: 1 %
Hematocrit: 38.8 % (ref 34.0–46.6)
Hemoglobin: 13.2 g/dL (ref 11.1–15.9)
Immature Grans (Abs): 0 10*3/uL (ref 0.0–0.1)
Immature Granulocytes: 0 %
Lymphocytes Absolute: 1.7 10*3/uL (ref 0.7–3.1)
Lymphs: 23 %
MCH: 31 pg (ref 26.6–33.0)
MCHC: 34 g/dL (ref 31.5–35.7)
MCV: 91 fL (ref 79–97)
Monocytes Absolute: 0.6 10*3/uL (ref 0.1–0.9)
Monocytes: 8 %
Neutrophils Absolute: 5 10*3/uL (ref 1.4–7.0)
Neutrophils: 68 %
Platelets: 290 10*3/uL (ref 150–450)
RBC: 4.26 x10E6/uL (ref 3.77–5.28)
RDW: 11.6 % — ABNORMAL LOW (ref 11.7–15.4)
WBC: 7.4 10*3/uL (ref 3.4–10.8)

## 2020-06-11 LAB — VITAMIN D 25 HYDROXY (VIT D DEFICIENCY, FRACTURES): Vit D, 25-Hydroxy: 58.3 ng/mL (ref 30.0–100.0)

## 2020-06-11 LAB — TSH+FREE T4
Free T4: 1.51 ng/dL (ref 0.82–1.77)
TSH: 0.84 u[IU]/mL (ref 0.450–4.500)

## 2020-06-11 LAB — VITAMIN B12: Vitamin B-12: 542 pg/mL (ref 232–1245)

## 2020-06-11 NOTE — Progress Notes (Signed)
[facility] [place],  [address]  Internal MEDICINE  Office Visit Note  Patient Name: [name]  [id number]  [id number]  Date of Service: 06/13/2020  Chief Complaint  Patient presents with  . Acute Visit  . Hypertension    HPI Patient is here for acute visit She had a fall yesterday, 1/27 at [employer] She had walked into the store, unsure as to if she tripped but ended up falling, landed on hard surface, hit her nose on an end table, possible hit her head and her right knee Cut on her nose with swelling and bruising No evidence of hematoma on head Right knee swollen, tender and bruised--not affecting her mobility  She can recall falling and aware that she was falling Denies dizziness, lightheadedness, palpitations or chest pain prior to falling Once she fell, she was able to stand up and walk out of the store  We have been working with her blood pressure and adjusting her medication Currently tapering herself off of Valsartan and has started Enalapril--home blood pressure readings have been stable as well as HR readings Yesterday, she had 80 mg valsartan and 5 mg enalapril Today she has discontinued Valsartan and will take 10 mg enalapril  Reviewed recent labs--normal potassium, B12, D, thyroid panel and CBC   Current Medication: Outpatient Encounter Medications as of 06/11/2020  Medication Sig  . Cholecalciferol (DIALYVITE VITAMIN D 5000) 125 MCG (5000 UT) capsule Take 5,000 Units by mouth daily.  . enalapril (VASOTEC) 5 MG tablet Take 1 tablet (5 mg total) by mouth daily.  [name] levothyroxine (SYNTHROID) 88 MCG tablet Take 1 tablet (88 mcg total) by mouth daily.  . Omega-3 Fatty Acids (FISH OIL PO) Take by mouth daily.  . Probiotic Product (TRUBIOTICS PO) Take by mouth daily. (Patient not taking: Reported on 04/12/2020)  . rOPINIRole (REQUIP) 0.5 MG tablet Take one tab po qhs for restless legs  . valsartan (DIOVAN) 160 MG  tablet Take 1 tablet (160 mg total) by mouth daily.   No facility-administered encounter medications on file as of 06/11/2020.    Surgical History: Past Surgical History:  Procedure Laterality Date  . ABDOMINAL HYSTERECTOMY  2007   endometriosis, [name]  . COLONOSCOPY WITH PROPOFOL N/A 10/31/2019   Procedure: COLONOSCOPY WITH PROPOFOL;  Surgeon: [name], MD;  Location: [facility];  Service: Endoscopy;  Laterality: N/A;  priority 4  . VASCULAR SURGERY Left    left leg    Medical History: Past Medical History:  Diagnosis Date  . Back pain    spinal stenosis  . Family history of adverse reaction to anesthesia    Mother - PONV and woke during surgery (x1)  . Hypertension   . Hypothyroidism   . Presence of dental prosthetic device    dental implant - top left  . Psoriasis    [name]  . Spinal stenosis   . Thyroid disease     Family History: Family History  Problem Relation Age of Onset  . Heart disease Father   . Diabetes Paternal Uncle   . Breast cancer Neg Hx     Social History   Socioeconomic History  . Marital status: Married    Spouse name: Not on file  . Number of children: Not on file  . Years of education: Not on file  . Highest education level: Not on file  Occupational History  . Not on file  Tobacco Use  . Smoking status: Former  Smoker    Types: Cigarettes    Quit date: 2008    Years since quitting: 14.0  . Smokeless tobacco: Never Used  Vaping Use  . Vaping Use: Never used  Substance and Sexual Activity  . Alcohol use: No  . Drug use: No  . Sexual activity: Not on file  Other Topics Concern  . Not on file  Social History Narrative   Lives in [place] with husband. Has step-daughter 24YO and grandson.      Work - [employer] in [place] - regular      Exercise - walks occasional   Social Determinants of Radio broadcast assistant Strain: Not on file  Food Insecurity: Not on file  Transportation Needs:  Not on file  Physical Activity: Not on file  Stress: Not on file  Social Connections: Not on file  Intimate Partner Violence: Not on file      Review of Systems  Constitutional: Negative for chills, diaphoresis and fatigue.  HENT: Negative for ear pain, postnasal drip and sinus pressure.   Eyes: Negative for photophobia, discharge, redness, itching and visual disturbance.  Respiratory: Negative for cough, shortness of breath and wheezing.   Cardiovascular: Negative for chest pain, palpitations and leg swelling.  Gastrointestinal: Negative for abdominal pain, constipation, diarrhea, nausea and vomiting.  Genitourinary: Negative for dysuria and flank pain.  Musculoskeletal: Negative for arthralgias, back pain, gait problem and neck pain.       Right knee pain, swelling, bruising  Skin: Negative for color change.       Abrasion to bridge of nose, bruising  Allergic/Immunologic: Negative for environmental allergies and food allergies.  Neurological: Negative for dizziness and headaches.  Hematological: Does not bruise/bleed easily.  Psychiatric/Behavioral: Negative for agitation, behavioral problems (depression) and hallucinations.    Vital Signs: BP 132/83   Pulse 76   Temp 97.6 F (36.4 C)   Resp 16   Ht 5\' 4"  (1.626 m)   Wt 176 lb 3.2 oz (79.9 kg)   SpO2 99%   BMI 30.24 kg/m    Physical Exam Vitals reviewed.  Constitutional:      Appearance: Normal appearance. She is normal weight.  Cardiovascular:     Rate and Rhythm: Normal rate and regular rhythm.     Pulses: Normal pulses.     Heart sounds: Normal heart sounds.  Pulmonary:     Effort: Pulmonary effort is normal.     Breath sounds: Normal breath sounds.  Musculoskeletal:     Cervical back: Normal range of motion.     Right knee: Swelling and ecchymosis present. Tenderness present.  Skin:    Comments: Abrasion to bridge of nose, ecchymosis  Neurological:     General: No focal deficit present.     Mental  Status: She is alert and oriented to person, place, and time. Mental status is at baseline.  Psychiatric:        Mood and Affect: Mood normal.        Behavior: Behavior normal.        Thought Content: Thought content normal.        Judgment: Judgment normal.    Assessment/Plan: 1. Fall, initial encounter CT head to rule out TIA//trauma to head secondary to fall Holter monitor to rule out arrhythmias potentially causing fall Continue with ice/heat and acetaminophen for right knee pain and swelling  - CT Head Wo Contrast; Future - Holter monitor - 24 hour; Future  2.  Essential hypertension BP and HR well controlled, currently on Enalapril 10 mg daily, Valsartan discontinued  3. Leg cramps One episode of leg cramping since last visit, again she feels this is associated with Valsartan--we are in process of weaning her off of valsartan, have started Enalapril  General Counseling: Chrissie Noa understanding of the findings of todays visit and agrees with plan of treatment. I have discussed any further diagnostic evaluation that may be needed or ordered today. We also reviewed her medications today. she has been encouraged to call the office with any questions or concerns that should arise related to todays visit.    Orders Placed This Encounter  Procedures  . CT Head Wo Contrast  . Holter monitor - 24 hour    Time spent: 30 Minutes Time spent includes review of chart, medications, test results and follow-up plan with the patient.  This patient was seen by Theodoro Grist AGNP-C in Collaboration with Dr [name] as a part of collaborative care agreement     Tanna Furry. Maurice Ramseur AGNP-C Internal medicine

## 2020-06-11 NOTE — Progress Notes (Signed)
Labs reviewed at visit.

## 2020-06-12 ENCOUNTER — Encounter: Payer: Self-pay | Admitting: Hospice and Palliative Medicine

## 2020-06-13 ENCOUNTER — Encounter: Payer: Self-pay | Admitting: Hospice and Palliative Medicine

## 2020-06-28 ENCOUNTER — Telehealth: Payer: Self-pay

## 2020-06-28 ENCOUNTER — Encounter: Payer: Self-pay | Admitting: Hospice and Palliative Medicine

## 2020-06-28 NOTE — Telephone Encounter (Signed)
Left a message and asked [facility] special dept to call back and schedule holter monitor hhok up. [phone]. [name]

## 2020-06-29 ENCOUNTER — Other Ambulatory Visit: Payer: Managed Care, Other (non HMO)

## 2020-06-29 ENCOUNTER — Telehealth: Payer: Self-pay

## 2020-06-29 NOTE — Telephone Encounter (Signed)
Returned patient call for holter hook up appointment. [name]

## 2020-06-30 ENCOUNTER — Ambulatory Visit
Admission: RE | Admit: 2020-06-30 | Discharge: 2020-06-30 | Disposition: A | Discharge status: Home / Self Care | Payer: 59 | Source: Ambulatory Visit | Attending: Hospice and Palliative Medicine | Admitting: Hospice and Palliative Medicine

## 2020-06-30 ENCOUNTER — Other Ambulatory Visit: Payer: Self-pay

## 2020-06-30 DIAGNOSIS — S0990XA Unspecified injury of head, initial encounter: Secondary | ICD-10-CM | POA: Diagnosis not present

## 2020-06-30 DIAGNOSIS — Y939 Activity, unspecified: Secondary | ICD-10-CM | POA: Diagnosis not present

## 2020-06-30 DIAGNOSIS — Y929 Unspecified place or not applicable: Secondary | ICD-10-CM | POA: Insufficient documentation

## 2020-06-30 DIAGNOSIS — W19XXXA Unspecified fall, initial encounter: Secondary | ICD-10-CM | POA: Diagnosis not present

## 2020-07-02 ENCOUNTER — Ambulatory Visit: Payer: 59

## 2020-07-22 ENCOUNTER — Encounter: Payer: Self-pay | Admitting: Physician Assistant

## 2020-07-22 ENCOUNTER — Ambulatory Visit (INDEPENDENT_AMBULATORY_CARE_PROVIDER_SITE_OTHER): Payer: 59 | Admitting: Physician Assistant

## 2020-07-22 DIAGNOSIS — R252 Cramp and spasm: Secondary | ICD-10-CM

## 2020-07-22 DIAGNOSIS — E039 Hypothyroidism, unspecified: Secondary | ICD-10-CM | POA: Diagnosis not present

## 2020-07-22 DIAGNOSIS — M25469 Effusion, unspecified knee: Secondary | ICD-10-CM | POA: Diagnosis not present

## 2020-07-22 DIAGNOSIS — Z124 Encounter for screening for malignant neoplasm of cervix: Secondary | ICD-10-CM | POA: Diagnosis not present

## 2020-07-22 DIAGNOSIS — Z113 Encounter for screening for infections with a predominantly sexual mode of transmission: Secondary | ICD-10-CM

## 2020-07-22 DIAGNOSIS — Z0001 Encounter for general adult medical examination with abnormal findings: Secondary | ICD-10-CM

## 2020-07-22 DIAGNOSIS — R300 Dysuria: Secondary | ICD-10-CM

## 2020-07-22 DIAGNOSIS — I10 Essential (primary) hypertension: Secondary | ICD-10-CM | POA: Diagnosis not present

## 2020-07-22 MED ORDER — ENALAPRIL MALEATE 10 MG PO TABS: 10.0000 mg | ORAL_TABLET | Freq: Every day | ORAL | 1 refills | Status: DC

## 2020-07-22 MED ORDER — LEVOTHYROXINE SODIUM 88 MCG PO TABS: 88.0000 ug | ORAL_TABLET | Freq: Every day | ORAL | 3 refills | Status: DC

## 2020-07-22 NOTE — Progress Notes (Signed)
[facility] [place], [place] [address]  Internal MEDICINE  Office Visit Note  Patient Name: [name]  [id number]  [id number]  Date of Service: 07/22/2020  Chief Complaint  Patient presents with   Annual Exam   Hypertension     HPI Pt is here for routine health maintenance examination -Pt is now only on enalapril 10mg . Has stopped valsartan. Still having leg cramps at night, only happens when she gets in bed. Does not feel the same as her RLS. Has not started on a statin bc already having these cramps, continue fish oil. Has been off valsartan at least 4 weeks. -Colonscopy done in June 2021 and mammo done in August 21. Will repeat this August. -R knee still has fluid pocket since fall, but is improving. -Wants to hold off on holter monitor for now, but will look into cost and reconsider if insurance covers.  Current Medication: Outpatient Encounter Medications as of 07/22/2020  Medication Sig   enalapril (VASOTEC) 10 MG tablet Take 1 tablet (10 mg total) by mouth daily.   Cholecalciferol (DIALYVITE VITAMIN D 5000) 125 MCG (5000 UT) capsule Take 5,000 Units by mouth daily.   levothyroxine (SYNTHROID) 88 MCG tablet Take 1 tablet (88 mcg total) by mouth daily.   Omega-3 Fatty Acids (FISH OIL PO) Take by mouth daily.   rOPINIRole (REQUIP) 0.5 MG tablet Take one tab po qhs for restless legs   [DISCONTINUED] enalapril (VASOTEC) 5 MG tablet Take 1 tablet (5 mg total) by mouth daily.   [DISCONTINUED] levothyroxine (SYNTHROID) 88 MCG tablet Take 1 tablet (88 mcg total) by mouth daily.   [DISCONTINUED] Probiotic Product (TRUBIOTICS PO) Take by mouth daily. (Patient not taking: Reported on 04/12/2020)   [DISCONTINUED] valsartan (DIOVAN) 160 MG tablet Take 1 tablet (160 mg total) by mouth daily.   No facility-administered encounter medications on file as of 07/22/2020.    Surgical History: Past Surgical History:  Procedure Laterality Date    ABDOMINAL HYSTERECTOMY  2007   endometriosis, [name]   COLONOSCOPY WITH PROPOFOL N/A 10/31/2019   Procedure: COLONOSCOPY WITH PROPOFOL;  Surgeon: [name], MD;  Location: [place];  Service: Endoscopy;  Laterality: N/A;  priority 4   VASCULAR SURGERY Left    left leg    Medical History: Past Medical History:  Diagnosis Date   Back pain    spinal stenosis   Family history of adverse reaction to anesthesia    Mother - PONV and woke during surgery (x1)   Hypertension    Hypothyroidism    Presence of dental prosthetic device    dental implant - top left   Psoriasis    [name]   Spinal stenosis    Thyroid disease     Family History: Family History  Problem Relation Age of Onset   Heart disease Father    Diabetes Paternal Uncle    Breast cancer Neg Hx       Review of Systems  Constitutional: Negative for chills, fatigue and unexpected weight change.  HENT: Negative for congestion, postnasal drip, rhinorrhea, sneezing and sore throat.   Eyes: Negative for redness.  Respiratory: Negative for cough, chest tightness and shortness of breath.   Cardiovascular: Negative for chest pain and palpitations.  Gastrointestinal: Negative for abdominal pain, constipation, diarrhea, nausea and vomiting.  Genitourinary: Negative for dysuria and frequency.  Musculoskeletal: Positive for joint swelling and myalgias. Negative for arthralgias, back pain and neck pain.  Skin: Negative for rash.  Neurological: Negative.  Negative for tremors, syncope, light-headedness, numbness and headaches.  Hematological: Negative for adenopathy. Does not bruise/bleed easily.  Psychiatric/Behavioral: Negative for behavioral problems (Depression), sleep disturbance and suicidal ideas. The patient is not nervous/anxious.      Vital Signs: BP (!) 142/92    Pulse 79    Temp (!) 97.4 F (36.3 C)    Resp 16    Ht 5\' 4"  (1.626 m)    Wt 176 lb 12.8 oz (80.2 kg)    SpO2 98%     BMI 30.35 kg/m    Physical Exam Exam conducted with a chaperone present.  Constitutional:      General: She is not in acute distress.    Appearance: She is well-developed. She is obese. She is not diaphoretic.  HENT:     Head: Normocephalic and atraumatic.     Right Ear: External ear normal.     Left Ear: External ear normal.     Nose: Nose normal.     Mouth/Throat:     Pharynx: No oropharyngeal exudate.  Eyes:     General: No scleral icterus.       Right eye: No discharge.        Left eye: No discharge.     Conjunctiva/sclera: Conjunctivae normal.     Pupils: Pupils are equal, round, and reactive to light.  Neck:     Thyroid: No thyromegaly.     Vascular: No JVD.     Trachea: No tracheal deviation.  Cardiovascular:     Rate and Rhythm: Normal rate and regular rhythm.     Heart sounds: Normal heart sounds. No murmur heard. No friction rub. No gallop.   Pulmonary:     Effort: Pulmonary effort is normal. No respiratory distress.     Breath sounds: Normal breath sounds. No stridor. No wheezing or rales.  Chest:     Chest wall: No tenderness.  Breasts:     Right: Normal. No mass.     Left: Normal. No mass.    Abdominal:     General: Bowel sounds are normal. There is no distension.     Palpations: Abdomen is soft. There is no mass.     Tenderness: There is no abdominal tenderness. There is no guarding or rebound.  Genitourinary:    Exam position: Lithotomy position.     Cervix: Friability present.     Comments: Atrophic changes with tenderness on speculum exam Musculoskeletal:        General: Swelling present. No tenderness or deformity. Normal range of motion.     Cervical back: Normal range of motion and neck supple.     Comments: Swelling along top of R knee since fall, non TTP  Lymphadenopathy:     Cervical: No cervical adenopathy.  Skin:    General: Skin is warm and dry.     Coloration: Skin is not pale.     Findings: No erythema or rash.  Neurological:      Mental Status: She is alert.     Cranial Nerves: No cranial nerve deficit.     Motor: No abnormal muscle tone.     Coordination: Coordination normal.     Deep Tendon Reflexes: Reflexes are normal and symmetric.  Psychiatric:        Behavior: Behavior normal.        Thought Content: Thought content normal.        Judgment: Judgment normal.      LABS: Recent Results (from the  past 2160 hour(s))  TSH + free T4     Status: None   Collection Time: 06/10/20  3:27 PM  Result Value Ref Range   TSH 0.840 0.450 - 4.500 uIU/mL   Free T4 1.51 0.82 - 1.77 ng/dL  CBC w/Diff/Platelet     Status: Abnormal   Collection Time: 06/10/20  3:36 PM  Result Value Ref Range   WBC 7.4 3.4 - 10.8 x10E3/uL   RBC 4.26 3.77 - 5.28 x10E6/uL   Hemoglobin 13.2 11.1 - 15.9 g/dL   Hematocrit 38.8 34.0 - 46.6 %   MCV 91 79 - 97 fL   MCH 31.0 26.6 - 33.0 pg   MCHC 34.0 31.5 - 35.7 g/dL   RDW 11.6 (L) 11.7 - 15.4 %   Platelets 290 150 - 450 x10E3/uL   Neutrophils 68 Not Estab. %   Lymphs 23 Not Estab. %   Monocytes 8 Not Estab. %   Eos 1 Not Estab. %   Basos 0 Not Estab. %   Neutrophils Absolute 5.0 1.4 - 7.0 x10E3/uL   Lymphocytes Absolute 1.7 0.7 - 3.1 x10E3/uL   Monocytes Absolute 0.6 0.1 - 0.9 x10E3/uL   EOS (ABSOLUTE) 0.1 0.0 - 0.4 x10E3/uL   Basophils Absolute 0.0 0.0 - 0.2 x10E3/uL   Immature Granulocytes 0 Not Estab. %   Immature Grans (Abs) 0.0 0.0 - 0.1 x10E3/uL  Potassium     Status: None   Collection Time: 06/10/20  3:36 PM  Result Value Ref Range   Potassium 4.3 3.5 - 5.2 mmol/L  B12     Status: None   Collection Time: 06/10/20  3:36 PM  Result Value Ref Range   Vitamin B-12 542 232 - 1,245 pg/mL  Vitamin D (25 hydroxy)     Status: None   Collection Time: 06/10/20  3:36 PM  Result Value Ref Range   Vit D, 25-Hydroxy 58.3 30.0 - 100.0 ng/mL    Comment: Vitamin D deficiency has been defined by the Institute of Medicine and an Endocrine Society practice guideline as a level of serum  25-OH vitamin D less than 20 ng/mL (1,2). The Endocrine Society went on to further define vitamin D insufficiency as a level between 21 and 29 ng/mL (2). 1. IOM (Institute of Medicine). 2010. Dietary reference    intakes for calcium and D. [place]: The    [employer]. 2. Holick MF, Binkley Perry, Bischoff-Ferrari HA, et al.    Evaluation, treatment, and prevention of vitamin D    deficiency: an Endocrine Society clinical practice    guideline. JCEM. 2011 Jul; 96(7):1911-30.        Assessment/Plan: 1. Encounter for general adult medical examination with abnormal findings Labs recently reviewed. Pt due for mammogram in August. Up to date on colonoscopy.  2. Essential hypertension Continue on enalapril 20mg  daily. Continue to monitor. - enalapril (VASOTEC) 10 MG tablet; Take 1 tablet (10 mg total) by mouth daily.  Dispense: 90 tablet; Refill: 1  3. Hypothyroidism, unspecified type Continue synthroid. Labs recently checked and appropriate. - levothyroxine (SYNTHROID) 88 MCG tablet; Take 1 tablet (88 mcg total) by mouth daily.  Dispense: 90 tablet; Refill: 3  4. Knee swelling Knee swelling along top of R knee since fall. No limit to ROM or weight bearing. It is improving, continue to monitor, may need XR or [place] if continues.  5. Leg cramps Still present despite stopping Valsartan and normal electrolytes. Differs from RLS and is on ropinirole. Continue to monitor. Only  a problem laying in bed.  6. Screening for STDs (sexually transmitted diseases) - NuSwab Vaginitis Plus (VG+)  7. Routine cervical smear - IGP, Aptima HPV  8. Dysuria - UA/M w/rflx Culture, Routine   General Counseling: Aliayah verbalizes understanding of the findings of todays visit and agrees with plan of treatment. I have discussed any further diagnostic evaluation that may be needed or ordered today. We also reviewed her medications today. she has been encouraged to call the office with any  questions or concerns that should arise related to todays visit.    Counseling:    Orders Placed This Encounter  Procedures   UA/M w/rflx Culture, Routine   NuSwab Vaginitis Plus (VG+)    Meds ordered this encounter  Medications   enalapril (VASOTEC) 10 MG tablet    Sig: Take 1 tablet (10 mg total) by mouth daily.    Dispense:  90 tablet    Refill:  1   levothyroxine (SYNTHROID) 88 MCG tablet    Sig: Take 1 tablet (88 mcg total) by mouth daily.    Dispense:  90 tablet    Refill:  3    This patient was seen by Drema Dallas, PA-C in collaboration with Dr. Clayborn Bigness as a part of collaborative care agreement.  Total time spent:30 Minutes  Time spent includes review of chart, medications, test results, and follow up plan with the patient.     [name], MD  Internal Medicine

## 2020-07-23 LAB — UA/M W/RFLX CULTURE, ROUTINE
Bilirubin, UA: NEGATIVE
Glucose, UA: NEGATIVE
Ketones, UA: NEGATIVE
Leukocytes,UA: NEGATIVE
Nitrite, UA: NEGATIVE
Protein,UA: NEGATIVE
RBC, UA: NEGATIVE
Specific Gravity, UA: 1.006 (ref 1.005–1.030)
Urobilinogen, Ur: 0.2 mg/dL (ref 0.2–1.0)
pH, UA: 8 — ABNORMAL HIGH (ref 5.0–7.5)

## 2020-07-23 LAB — MICROSCOPIC EXAMINATION
Bacteria, UA: NONE SEEN
Casts: NONE SEEN /lpf
Epithelial Cells (non renal): NONE SEEN /hpf (ref 0–10)
RBC, Urine: NONE SEEN /hpf (ref 0–2)
WBC, UA: NONE SEEN /hpf (ref 0–5)

## 2020-07-25 LAB — NUSWAB VAGINITIS PLUS (VG+)
Candida albicans, NAA: NEGATIVE
Candida glabrata, NAA: NEGATIVE
Chlamydia trachomatis, NAA: NEGATIVE
Neisseria gonorrhoeae, NAA: NEGATIVE
Trich vag by NAA: NEGATIVE

## 2020-07-27 LAB — IGP, APTIMA HPV: HPV Aptima: NEGATIVE

## 2020-08-05 ENCOUNTER — Ambulatory Visit: Payer: 59 | Admitting: Internal Medicine

## 2020-08-05 ENCOUNTER — Encounter: Payer: Self-pay | Admitting: Hospice and Palliative Medicine

## 2020-11-23 ENCOUNTER — Ambulatory Visit: Payer: 59 | Admitting: Hospice and Palliative Medicine

## 2020-11-24 ENCOUNTER — Ambulatory Visit: Payer: 59 | Admitting: Nurse Practitioner

## 2020-12-23 ENCOUNTER — Ambulatory Visit: Payer: 59 | Admitting: Nurse Practitioner

## 2020-12-23 ENCOUNTER — Other Ambulatory Visit: Payer: Self-pay

## 2020-12-23 ENCOUNTER — Encounter: Payer: Self-pay | Admitting: Nurse Practitioner

## 2020-12-23 DIAGNOSIS — Z113 Encounter for screening for infections with a predominantly sexual mode of transmission: Secondary | ICD-10-CM

## 2020-12-23 DIAGNOSIS — M25469 Effusion, unspecified knee: Secondary | ICD-10-CM

## 2020-12-23 DIAGNOSIS — G2581 Restless legs syndrome: Secondary | ICD-10-CM | POA: Diagnosis not present

## 2020-12-23 DIAGNOSIS — R252 Cramp and spasm: Secondary | ICD-10-CM

## 2020-12-23 DIAGNOSIS — E039 Hypothyroidism, unspecified: Secondary | ICD-10-CM | POA: Diagnosis not present

## 2020-12-23 DIAGNOSIS — I10 Essential (primary) hypertension: Secondary | ICD-10-CM | POA: Diagnosis not present

## 2020-12-23 DIAGNOSIS — Z124 Encounter for screening for malignant neoplasm of cervix: Secondary | ICD-10-CM

## 2020-12-23 DIAGNOSIS — Z0001 Encounter for general adult medical examination with abnormal findings: Secondary | ICD-10-CM

## 2020-12-23 DIAGNOSIS — R300 Dysuria: Secondary | ICD-10-CM

## 2020-12-23 MED ORDER — ROPINIROLE HCL 0.5 MG PO TABS: ORAL_TABLET | ORAL | 3 refills | Status: DC

## 2020-12-23 MED ORDER — ENALAPRIL MALEATE 10 MG PO TABS: 10.0000 mg | ORAL_TABLET | Freq: Every day | ORAL | 1 refills | Status: DC

## 2020-12-23 NOTE — Progress Notes (Signed)
[facility] [place], [place] [address]  Internal MEDICINE  Office Visit Note  Patient Name: [name]  [id number]  [id number]  Date of Service: 12/23/2020  Chief Complaint  Patient presents with   Follow-up    Med review, med refill     HPI Saraiya presents for a follow up visit for medication review and refills. Her blood pressure is wnl, takes enalapril for hypertension. She reports that the leg ccramps are improving. She recently [facility] and do body scans. She reports everything was fine. She needs refills of enalapril and ropinirole. She is also on levothyroxine for hypothyroidism bu does not need refills of that.    Current Medication: Outpatient Encounter Medications as of 12/23/2020  Medication Sig   levothyroxine (SYNTHROID) 88 MCG tablet Take 1 tablet (88 mcg total) by mouth daily.   Omega-3 Fatty Acids (FISH OIL PO) Take by mouth daily.   [DISCONTINUED] enalapril (VASOTEC) 10 MG tablet Take 1 tablet (10 mg total) by mouth daily.   [DISCONTINUED] rOPINIRole (REQUIP) 0.5 MG tablet Take one tab po qhs for restless legs   Cholecalciferol (DIALYVITE VITAMIN D 5000) 125 MCG (5000 UT) capsule Take 5,000 Units by mouth daily. (Patient not taking: Reported on 12/23/2020)   enalapril (VASOTEC) 10 MG tablet Take 1 tablet (10 mg total) by mouth daily.   rOPINIRole (REQUIP) 0.5 MG tablet Take one tab po qhs for restless legs   No facility-administered encounter medications on file as of 12/23/2020.    Surgical History: Past Surgical History:  Procedure Laterality Date   ABDOMINAL HYSTERECTOMY  2007   endometriosis, [name]   COLONOSCOPY WITH PROPOFOL N/A 10/31/2019   Procedure: COLONOSCOPY WITH PROPOFOL;  Surgeon: [name], MD;  Location: [place];  Service: Endoscopy;  Laterality: N/A;  priority 4   VASCULAR SURGERY Left    left leg    Medical  History: Past Medical History:  Diagnosis Date   Back pain    spinal stenosis   Family history of adverse reaction to anesthesia    Mother - PONV and woke during surgery (x1)   Hypertension    Hypothyroidism    Presence of dental prosthetic device    dental implant - top left   Psoriasis    [name]   Spinal stenosis    Thyroid disease     Family History: Family History  Problem Relation Age of Onset   Heart disease Father    Diabetes Paternal Uncle    Breast cancer Neg Hx     Social History   Socioeconomic History   Marital status: Married    Spouse name: Not on file   Number of children: Not on file   Years of education: Not on file   Highest education level: Not on file  Occupational History   Not on file  Tobacco Use   Smoking status: Former    Types: Cigarettes    Quit date: 2008    Years since quitting: 14.6   Smokeless tobacco: Never  Vaping Use   Vaping Use: Never used  Substance and Sexual Activity   Alcohol use: No   Drug use: No   Sexual activity: Not on file  Other Topics Concern   Not on file  Social History Narrative   Lives in [place] with husband. Has step-daughter 24YO and grandson.      Work - [employer] in Engineer, technical sales  dept      Diet - regular      Exercise - walks occasional   Social Determinants of Health   Financial Resource Strain: Not on file  Food Insecurity: Not on file  Transportation Needs: Not on file  Physical Activity: Not on file  Stress: Not on file  Social Connections: Not on file  Intimate Partner Violence: Not on file      Review of Systems  Constitutional:  Negative for chills, fatigue and unexpected weight change.  HENT:  Negative for congestion, rhinorrhea, sneezing and sore throat.   Eyes:  Negative for redness.  Respiratory:  Negative for cough, chest tightness and shortness of breath.   Cardiovascular:  Negative for chest pain and palpitations.  Gastrointestinal:  Negative for abdominal pain, constipation,  diarrhea, nausea and vomiting.  Genitourinary:  Negative for dysuria and frequency.  Musculoskeletal:  Negative for arthralgias, back pain, joint swelling and neck pain.  Skin:  Negative for rash.  Neurological: Negative.  Negative for tremors and numbness.  Hematological:  Negative for adenopathy. Does not bruise/bleed easily.  Psychiatric/Behavioral:  Negative for behavioral problems (Depression), sleep disturbance and suicidal ideas. The patient is not nervous/anxious.    Vital Signs: BP 136/72   Pulse 83   Temp 98.2 F (36.8 C)   Resp 16   Ht '5\' 4"'$  (1.626 m)   Wt 182 lb 12.8 oz (82.9 kg)   SpO2 99%   BMI 31.38 kg/m    Physical Exam Vitals reviewed.  Constitutional:      General: She is not in acute distress.    Appearance: Normal appearance. She is not ill-appearing.  HENT:     Head: Normocephalic and atraumatic.  Cardiovascular:     Rate and Rhythm: Normal rate and regular rhythm.  Pulmonary:     Effort: Pulmonary effort is normal. No respiratory distress.  Skin:    General: Skin is warm and dry.     Capillary Refill: Capillary refill takes less than 2 seconds.  Neurological:     Mental Status: She is alert and oriented to person, place, and time.  Psychiatric:        Mood and Affect: Mood normal.        Behavior: Behavior normal.    Assessment/Plan: 1. Essential hypertension Stable, continue as prescribed, refill ordered.  - enalapril (VASOTEC) 10 MG tablet; Take 1 tablet (10 mg total) by mouth daily.  Dispense: 90 tablet; Refill: 1  2. Restless leg syndrome Stable, continue as prescribed, refill ordered.  - rOPINIRole (REQUIP) 0.5 MG tablet; Take one tab po qhs for restless legs  Dispense: 90 tablet; Refill: 3  3. Acquired hypothyroidism Takes levothyroxine, no refill needed at this time, continue as prescribed.   4. Leg cramps Improved, no intervention needed.    General Counseling: jynessa bastien understanding of the findings of todays visit and  agrees with plan of treatment. I have discussed any further diagnostic evaluation that may be needed or ordered today. We also reviewed her medications today. she has been encouraged to call the office with any questions or concerns that should arise related to todays visit.    No orders of the defined types were placed in this encounter.   Meds ordered this encounter  Medications   enalapril (VASOTEC) 10 MG tablet    Sig: Take 1 tablet (10 mg total) by mouth daily.    Dispense:  90 tablet    Refill:  1   rOPINIRole (REQUIP) 0.5 MG tablet  Sig: Take one tab po qhs for restless legs    Dispense:  90 tablet    Refill:  3    Return in about 6 months (around 06/25/2021) for F/U, Madelynne Lasker PCP.   Total time spent:20 Minutes Time spent includes review of chart, medications, test results, and follow up plan with the patient.   [place] Controlled Substance Database was reviewed by me.  This patient was seen by Jonetta Osgood, FNP-C in collaboration with Dr. Clayborn Bigness as a part of collaborative care agreement.   Temiloluwa [place] R. Valetta Fuller, MSN, FNP-C Internal medicine

## 2021-02-21 ENCOUNTER — Other Ambulatory Visit: Payer: Self-pay | Admitting: Internal Medicine

## 2021-02-21 DIAGNOSIS — Z1231 Encounter for screening mammogram for malignant neoplasm of breast: Secondary | ICD-10-CM

## 2021-03-14 ENCOUNTER — Ambulatory Visit
Admission: RE | Admit: 2021-03-14 | Discharge: 2021-03-14 | Disposition: A | Discharge status: Home / Self Care | Payer: 59 | Source: Ambulatory Visit | Attending: Internal Medicine | Admitting: Internal Medicine

## 2021-03-14 ENCOUNTER — Other Ambulatory Visit: Payer: Self-pay

## 2021-03-14 DIAGNOSIS — Z1231 Encounter for screening mammogram for malignant neoplasm of breast: Secondary | ICD-10-CM | POA: Diagnosis not present

## 2021-04-12 ENCOUNTER — Encounter: Payer: Self-pay | Admitting: Nurse Practitioner

## 2021-07-19 ENCOUNTER — Encounter: Payer: 59 | Admitting: Nurse Practitioner

## 2021-07-20 ENCOUNTER — Other Ambulatory Visit: Payer: Self-pay | Admitting: Internal Medicine

## 2021-07-22 ENCOUNTER — Other Ambulatory Visit: Payer: Self-pay

## 2021-07-22 ENCOUNTER — Encounter: Payer: Self-pay | Admitting: Internal Medicine

## 2021-07-22 ENCOUNTER — Ambulatory Visit: Payer: 59 | Admitting: Internal Medicine

## 2021-07-22 VITALS — BP 118/78 | HR 85 | Ht 64.0 in | Wt 188.0 lb

## 2021-07-22 DIAGNOSIS — I10 Essential (primary) hypertension: Secondary | ICD-10-CM | POA: Insufficient documentation

## 2021-07-22 DIAGNOSIS — R252 Cramp and spasm: Secondary | ICD-10-CM | POA: Insufficient documentation

## 2021-07-22 DIAGNOSIS — E039 Hypothyroidism, unspecified: Secondary | ICD-10-CM

## 2021-07-22 MED ORDER — LEVOTHYROXINE SODIUM 88 MCG PO TABS: 88.0000 ug | ORAL_TABLET | Freq: Every day | ORAL | 1 refills | Status: DC

## 2021-07-22 MED ORDER — ENALAPRIL MALEATE 10 MG PO TABS: 10.0000 mg | ORAL_TABLET | Freq: Every day | ORAL | 1 refills | Status: DC

## 2021-07-22 MED ORDER — ROPINIROLE HCL 0.5 MG PO TABS: ORAL_TABLET | ORAL | 1 refills | Status: DC

## 2021-07-22 NOTE — Progress Notes (Signed)
? ? ?Date:  07/22/2021  ? ?Name:  [name]   DOB:  [birth year]   MRN:  [id number] ? ? ?Chief Complaint: Establish Care ? ?Hypertension ?This is a chronic problem. The problem is controlled. Pertinent negatives include no chest pain, headaches, palpitations or shortness of breath. There are no known risk factors for coronary artery disease. Past treatments include ACE inhibitors. The current treatment provides significant improvement. There is no history of kidney disease, CAD/MI or CVA. Identifiable causes of hypertension include a thyroid problem.  ?Thyroid Problem ?Presents for follow-up visit. Patient reports no anxiety, constipation, diarrhea, fatigue, hair loss, leg swelling, palpitations, weight gain or weight loss. The symptoms have been stable.  ?Muscle Pain ?This is a recurrent problem. The problem has been gradually worsening since onset. The pain is present in the right lower leg and left lower leg. The pain is severe (due to intermittent muscle cramps). Pertinent negatives include no abdominal pain, chest pain, constipation, diarrhea, fatigue, headaches, shortness of breath, weakness or wheezing. Treatments tried: mustard and Requip. The treatment provided moderate relief.  ? ?Lab Results  ?Component Value Date  ? NA 140 03/03/2020  ? K 4.3 06/10/2020  ? CO2 25 03/03/2020  ? GLUCOSE 85 03/03/2020  ? BUN 19 03/03/2020  ? CREATININE 0.88 03/03/2020  ? CALCIUM 9.7 03/03/2020  ? GFRNONAA 74 03/03/2020  ? ?Lab Results  ?Component Value Date  ? CHOL 245 (H) 03/03/2020  ? HDL 70 03/03/2020  ? LDLCALC 166 (H) 03/03/2020  ? TRIG 58 03/03/2020  ? ?Lab Results  ?Component Value Date  ? TSH 0.840 06/10/2020  ? ?Lab Results  ?Component Value Date  ? HGBA1C 5.5 03/28/2013  ? ?Lab Results  ?Component Value Date  ? WBC 7.4 06/10/2020  ? HGB 13.2 06/10/2020  ? HCT 38.8 06/10/2020  ? MCV 91 06/10/2020  ? PLT 290 06/10/2020  ? ?Lab Results  ?Component Value Date  ? ALT 17 03/03/2020  ? AST 15 03/03/2020  ? ALKPHOS 90  03/03/2020  ? BILITOT 0.4 03/03/2020  ? ?Lab Results  ?Component Value Date  ? VD25OH 58.3 06/10/2020  ?  ? ?Review of Systems  ?Constitutional:  Negative for chills, fatigue, unexpected weight change, weight gain and weight loss.  ?HENT:  Negative for nosebleeds.   ?Eyes:  Negative for visual disturbance.  ?Respiratory:  Negative for cough, chest tightness, shortness of breath and wheezing.   ?Cardiovascular:  Negative for chest pain, palpitations and leg swelling.  ?Gastrointestinal:  Negative for abdominal pain, constipation and diarrhea.  ?Neurological:  Negative for dizziness, weakness, light-headedness and headaches.  ?Psychiatric/Behavioral:  Negative for dysphoric mood and sleep disturbance. The patient is not nervous/anxious.   ? ?Patient Active Problem List  ? Diagnosis Date Noted  ? Essential hypertension 07/22/2021  ? Arthralgia of both ankles 08/13/2018  ? Restless leg syndrome 06/13/2017  ? Iron deficiency 06/13/2017  ? Varicose veins of left lower extremity with inflammation 06/13/2017  ? Bilateral hand numbness 03/20/2013  ? Psoriasis 03/20/2013  ? Chronic low back pain 03/20/2013  ? Hypothyroidism 07/22/2012  ? Obesity (BMI 30-39.9) 07/22/2012  ? ? ?No Known Allergies ? ?Past Surgical History:  ?Procedure Laterality Date  ? ABDOMINAL HYSTERECTOMY  2007  ? endometriosis, [name]  ? COLONOSCOPY WITH PROPOFOL N/A 10/31/2019  ? Procedure: COLONOSCOPY WITH PROPOFOL;  Surgeon: [name], MD;  Location: [place];  Service: Endoscopy;  Laterality: N/A;  priority 4  ? VASCULAR SURGERY Left   ? left leg  ? ? ?  Social History  ? ?Tobacco Use  ? Smoking status: Former  ?  Types: Cigarettes  ?  Quit date: 2008  ?  Years since quitting: 15.1  ? Smokeless tobacco: Never  ?Vaping Use  ? Vaping Use: Never used  ?Substance Use Topics  ? Alcohol use: Yes  ?  Comment: occasional  ? Drug use: No  ? ? ? ?Medication list has been reviewed and updated. ? ?Current Meds  ?Medication Sig  ? enalapril  (VASOTEC) 10 MG tablet Take 1 tablet (10 mg total) by mouth daily.  ? levothyroxine (SYNTHROID) 88 MCG tablet Take 1 tablet (88 mcg total) by mouth daily.  ? rOPINIRole (REQUIP) 0.5 MG tablet Take one tab po qhs for restless legs  ? ? ?PHQ 2/9 Scores 07/22/2021 12/23/2020 07/22/2020 05/26/2020  ?PHQ - 2 Score 1 0 0 0  ?PHQ- 9 Score 5 - - -  ? ? ?GAD 7 : Generalized Anxiety Score 07/22/2021  ?Nervous, Anxious, on Edge 0  ?Control/stop worrying 0  ?Worry too much - different things 0  ?Trouble relaxing 0  ?Restless 0  ?Easily annoyed or irritable 0  ?Afraid - awful might happen 0  ?Total GAD 7 Score 0  ? ? ?BP Readings from Last 3 Encounters:  ?07/22/21 118/78  ?12/23/20 136/72  ?07/22/20 (!) 142/92  ? ? ?Physical Exam ?Vitals and nursing note reviewed.  ?Constitutional:   ?   General: She is not in acute distress. ?   Appearance: Normal appearance. She is well-developed.  ?HENT:  ?   Head: Normocephalic and atraumatic.  ?Neck:  ?   Vascular: No carotid bruit.  ?Cardiovascular:  ?   Rate and Rhythm: Normal rate and regular rhythm.  ?   Pulses: Normal pulses.  ?Pulmonary:  ?   Effort: Pulmonary effort is normal. No respiratory distress.  ?   Breath sounds: No wheezing or rhonchi.  ?Musculoskeletal:  ?   Cervical back: Normal range of motion.  ?   Right lower leg: No edema.  ?   Left lower leg: No edema.  ?Lymphadenopathy:  ?   Cervical: No cervical adenopathy.  ?Skin: ?   General: Skin is warm and dry.  ?   Capillary Refill: Capillary refill takes less than 2 seconds.  ?   Findings: No rash.  ?Neurological:  ?   General: No focal deficit present.  ?   Mental Status: She is alert and oriented to person, place, and time.  ?Psychiatric:     ?   Mood and Affect: Mood normal.     ?   Behavior: Behavior normal.  ? ? ?Wt Readings from Last 3 Encounters:  ?07/22/21 188 lb (85.3 kg)  ?12/23/20 182 lb 12.8 oz (82.9 kg)  ?07/22/20 176 lb 12.8 oz (80.2 kg)  ? ? ?BP 118/78   Pulse 85   Ht '5\' 4"'$  (1.626 m)   Wt 188 lb (85.3 kg)   SpO2  98%   BMI 32.27 kg/m?  ? ?Assessment and Plan: ?1. Essential hypertension ?Clinically stable exam with well controlled BP. ?Tolerating medications without side effects at this time. ?Pt to continue current regimen and low sodium diet; benefits of regular exercise as able discussed. ?- enalapril (VASOTEC) 10 MG tablet; Take 1 tablet (10 mg total) by mouth daily.  Dispense: 90 tablet; Refill: 1 ? ?2. Acquired hypothyroidism ?Supplemented - labs next visit ?- levothyroxine (SYNTHROID) 88 MCG tablet; Take 1 tablet (88 mcg total) by mouth daily.  Dispense: 90 tablet; Refill:  1 ? ?3. Bilateral leg cramps ?Continue Requip - recommend taking nightly for one month to better determine benefit. ?- rOPINIRole (REQUIP) 0.5 MG tablet; Take one tab po qhs for restless legs  Dispense: 90 tablet; Refill: 1 ? ?Return for CPX and labs in the near future. ? ?Partially dictated using Editor, commissioning. Any errors are unintentional. ? ?Halina Maidens, MD ?[facility] ?[facility] ? ?07/22/2021 ? ? ? ? ? ?

## 2021-07-22 NOTE — Progress Notes (Signed)
? ? ?  Date:  07/22/2021  ? ?Name:  [name]   DOB:  [birth year]   MRN:  [id number] ? ? ?Chief Complaint: Establish Care ? ?HPI ? ?Lab Results  ?Component Value Date  ? NA 140 03/03/2020  ? K 4.3 06/10/2020  ? CO2 25 03/03/2020  ? GLUCOSE 85 03/03/2020  ? BUN 19 03/03/2020  ? CREATININE 0.88 03/03/2020  ? CALCIUM 9.7 03/03/2020  ? GFRNONAA 74 03/03/2020  ? ?Lab Results  ?Component Value Date  ? CHOL 245 (H) 03/03/2020  ? HDL 70 03/03/2020  ? LDLCALC 166 (H) 03/03/2020  ? TRIG 58 03/03/2020  ? ?Lab Results  ?Component Value Date  ? TSH 0.840 06/10/2020  ? ?Lab Results  ?Component Value Date  ? HGBA1C 5.5 03/28/2013  ? ?Lab Results  ?Component Value Date  ? WBC 7.4 06/10/2020  ? HGB 13.2 06/10/2020  ? HCT 38.8 06/10/2020  ? MCV 91 06/10/2020  ? PLT 290 06/10/2020  ? ?Lab Results  ?Component Value Date  ? ALT 17 03/03/2020  ? AST 15 03/03/2020  ? ALKPHOS 90 03/03/2020  ? BILITOT 0.4 03/03/2020  ? ?Lab Results  ?Component Value Date  ? VD25OH 58.3 06/10/2020  ?  ? ?Review of Systems ? ?Patient Active Problem List  ? Diagnosis Date Noted  ? Essential hypertension 07/22/2021  ? Arthralgia of both ankles 08/13/2018  ? Restless leg syndrome 06/13/2017  ? Iron deficiency 06/13/2017  ? Varicose veins of left lower extremity with inflammation 06/13/2017  ? Bilateral hand numbness 03/20/2013  ? Psoriasis 03/20/2013  ? Chronic low back pain 03/20/2013  ? Unspecified hypothyroidism 07/22/2012  ? Obesity (BMI 30-39.9) 07/22/2012  ? ? ?No Known Allergies ? ?Past Surgical History:  ?Procedure Laterality Date  ? ABDOMINAL HYSTERECTOMY  2007  ? endometriosis, [name]  ? COLONOSCOPY WITH PROPOFOL N/A 10/31/2019  ? Procedure: COLONOSCOPY WITH PROPOFOL;  Surgeon: [name], MD;  Location: [facility];  Service: Endoscopy;  Laterality: N/A;  priority 4  ? VASCULAR SURGERY Left   ? left leg  ? ? ?Social History  ? ?Tobacco Use  ? Smoking status: Former  ?  Types: Cigarettes  ?  Quit date: 2008  ?  Years since quitting:  15.1  ? Smokeless tobacco: Never  ?Vaping Use  ? Vaping Use: Never used  ?Substance Use Topics  ? Alcohol use: No  ? Drug use: No  ? ? ? ?Medication list has been reviewed and updated. ? ?No outpatient medications have been marked as taking for the 07/22/21 encounter (Office Visit) with Glean Hess, MD.  ? ? ?PHQ 2/9 Scores 12/23/2020 07/22/2020 05/26/2020 02/24/2020  ?PHQ - 2 Score 0 0 0 0  ? ? ?No flowsheet data found. ? ?BP Readings from Last 3 Encounters:  ?12/23/20 136/72  ?07/22/20 (!) 142/92  ?06/11/20 132/83  ? ? ?Physical Exam ? ?Wt Readings from Last 3 Encounters:  ?12/23/20 182 lb 12.8 oz (82.9 kg)  ?07/22/20 176 lb 12.8 oz (80.2 kg)  ?06/11/20 176 lb 3.2 oz (79.9 kg)  ? ? ?Ht '5\' 4"'$  (1.626 m)   BMI 31.38 kg/m?  ? ?Assessment and Plan: ? ? ? ? ? ?

## 2021-08-24 IMAGING — CT CT HEAD W/O CM
3 of 4 series · 13 of 47 positions shown, 15 images · non-contrast
Comparison: None.

CLINICAL DATA: Recent fall with facial and head injury.

EXAM:
CT HEAD WITHOUT CONTRAST
TECHNIQUE: Contiguous axial images were obtained from the base of the skull
through the vertex without intravenous contrast.

[Series 2: axial st head 5.00 ax · axial · 0.32mm/px · z∈[-600,-506]mm · 7 of 27 slices shown, 9 images]
[im 4/27  brain]
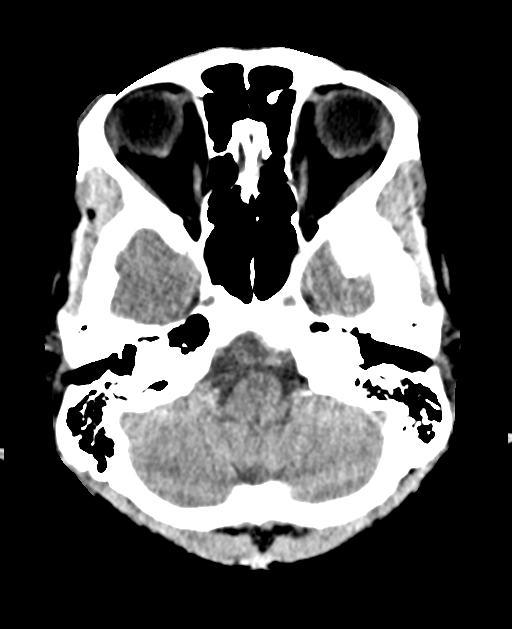
[im 4/27  bone]
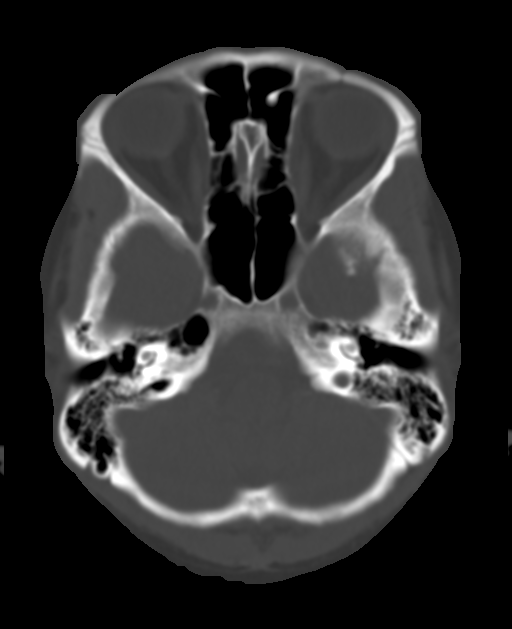
[im 7/27  brain]
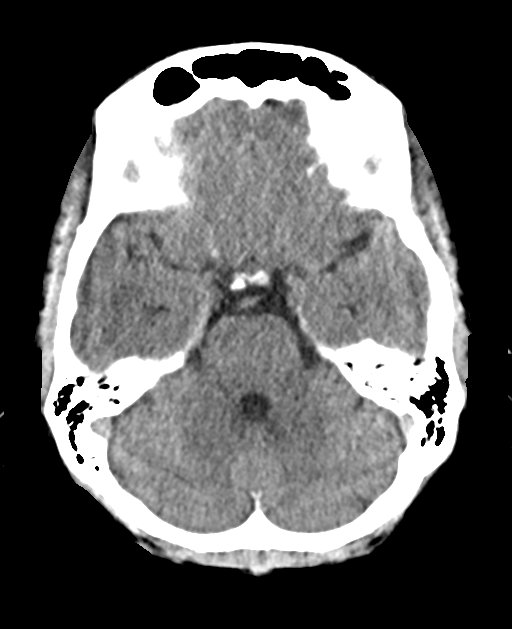
[im 10/27  brain]
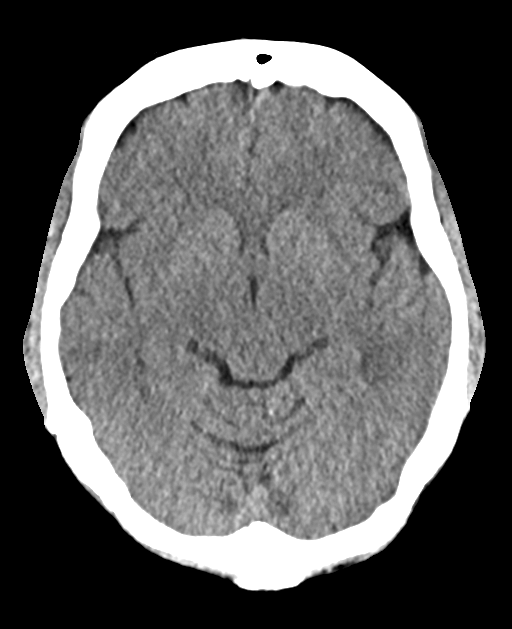
[im 14/27  brain]
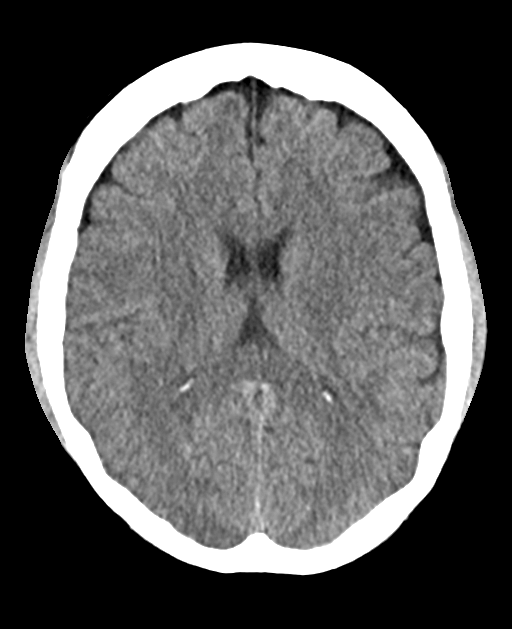
[im 17/27  brain]
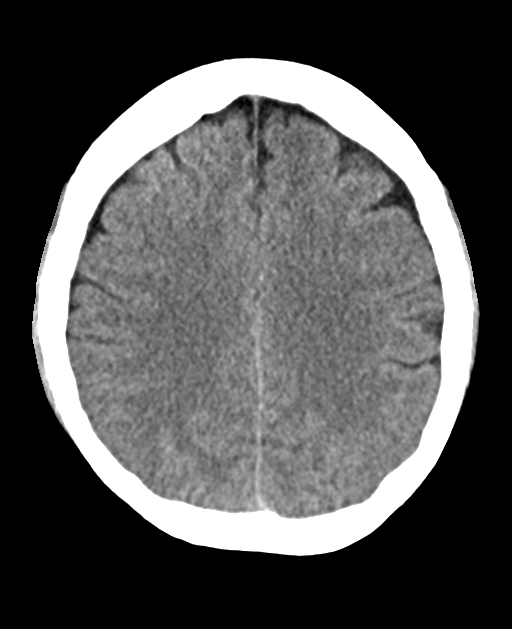
[im 17/27  bone]
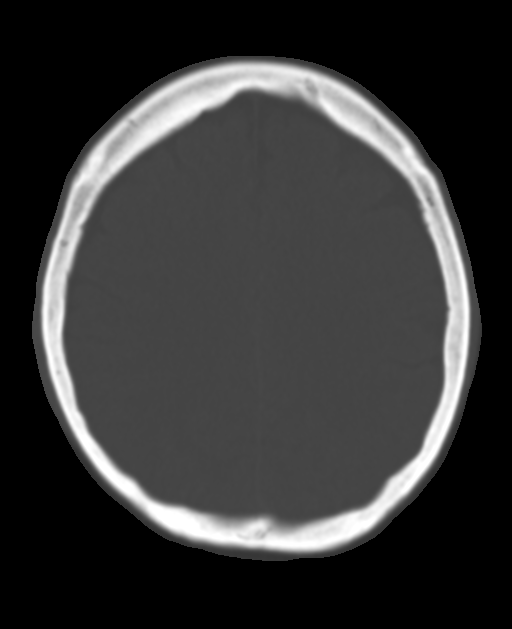
[im 20/27  brain]
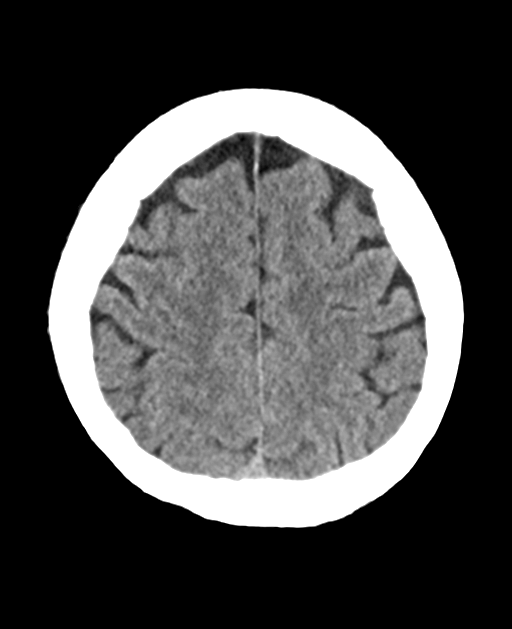
[im 23/27  brain]
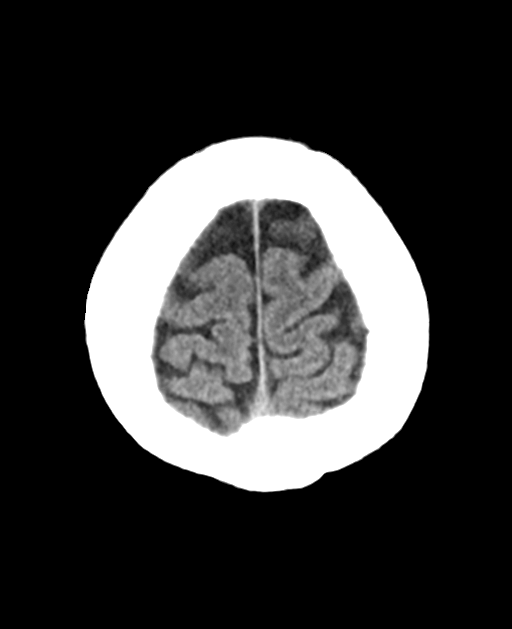

[Series 6: coronals head 3.00 cor · coronal · 0.27mm/px · 3 of 67 slices shown]
[im 23/67  brain]
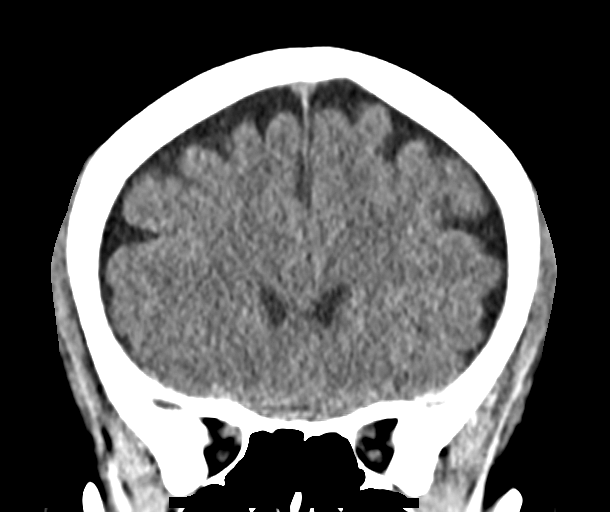
[im 30/67  brain]
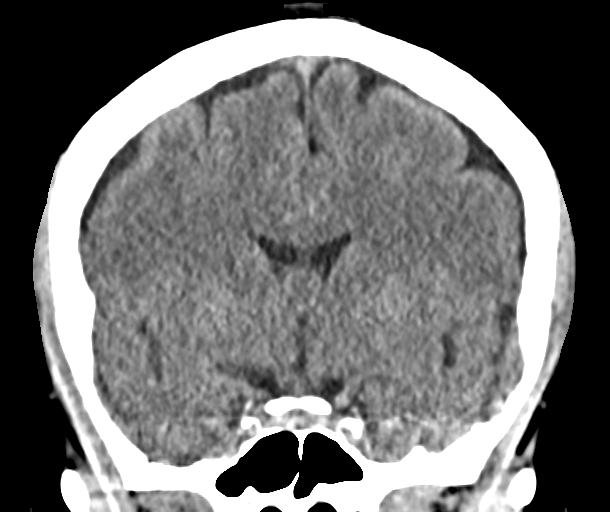
[im 37/67  brain]
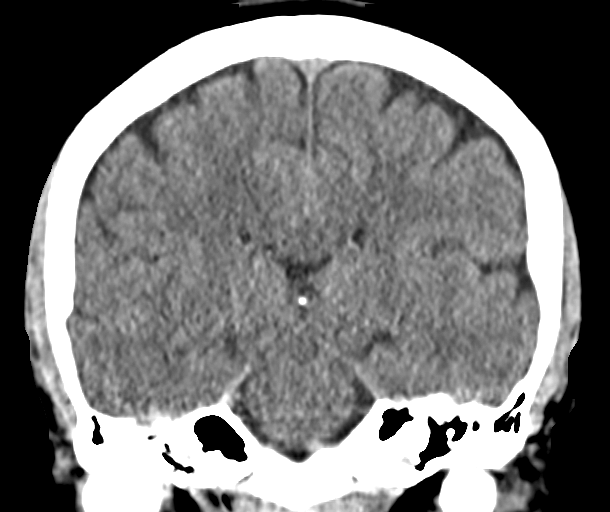

[Series 8: sagittals head 3.00 sag · sagittal · 0.27mm/px · 3 of 54 slices shown]
[im 18/54  brain]
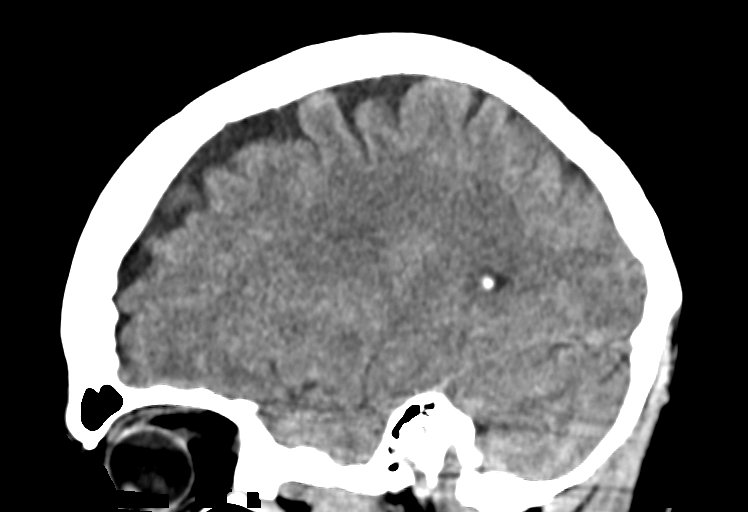
[im 27/54  brain]
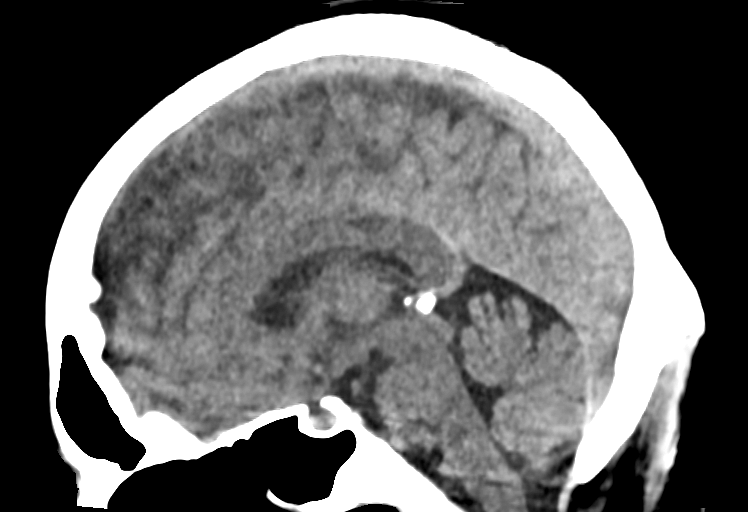
[im 36/54  brain]
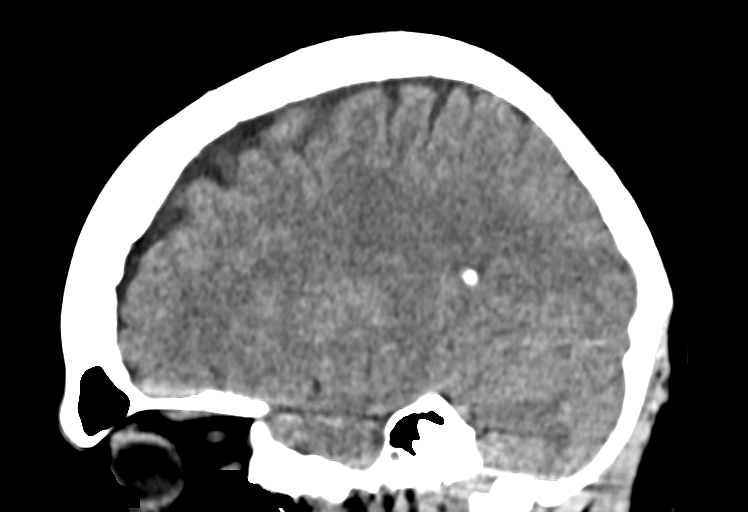

[13 of 47 positions shown; findings below may reference images not displayed]

FINDINGS: Brain:

No evidence of large-territorial acute infarction. No parenchymal
hemorrhage. No mass lesion. No extra-axial collection.

No mass effect or midline shift. No hydrocephalus. Basilar cisterns
are patent.

Vascular: No hyperdense vessel. Atherosclerotic calcifications are
present within the cavernous internal carotid arteries.

Skull: No acute fracture or focal lesion.

Sinuses/Orbits: Paranasal sinuses and mastoid air cells are clear.
The orbits are unremarkable.

Other: None.
IMPRESSION: No acute intracranial abnormality.

## 2021-09-07 ENCOUNTER — Ambulatory Visit (INDEPENDENT_AMBULATORY_CARE_PROVIDER_SITE_OTHER): Payer: 59 | Admitting: Internal Medicine

## 2021-09-07 ENCOUNTER — Encounter: Payer: Self-pay | Admitting: Internal Medicine

## 2021-09-07 VITALS — BP 132/78 | HR 78 | Ht 64.0 in | Wt 186.0 lb

## 2021-09-07 DIAGNOSIS — E039 Hypothyroidism, unspecified: Secondary | ICD-10-CM | POA: Diagnosis not present

## 2021-09-07 DIAGNOSIS — G2581 Restless legs syndrome: Secondary | ICD-10-CM | POA: Diagnosis not present

## 2021-09-07 DIAGNOSIS — Z0000 Encounter for general adult medical examination without abnormal findings: Secondary | ICD-10-CM

## 2021-09-07 DIAGNOSIS — Z1231 Encounter for screening mammogram for malignant neoplasm of breast: Secondary | ICD-10-CM

## 2021-09-07 DIAGNOSIS — I10 Essential (primary) hypertension: Secondary | ICD-10-CM | POA: Diagnosis not present

## 2021-09-07 LAB — POCT URINALYSIS DIPSTICK
Bilirubin, UA: NEGATIVE
Blood, UA: NEGATIVE
Glucose, UA: NEGATIVE
Ketones, UA: NEGATIVE
Leukocytes, UA: NEGATIVE
Nitrite, UA: NEGATIVE
Protein, UA: NEGATIVE
Spec Grav, UA: 1.015 (ref 1.010–1.025)
Urobilinogen, UA: 0.2 E.U./dL
pH, UA: 6.5 (ref 5.0–8.0)

## 2021-09-07 NOTE — Progress Notes (Signed)
? ? ?Date:  09/07/2021  ? ?Name:  [name]   DOB:  1960-06-05   MRN:  [id number] ? ? ?Chief Complaint: Annual Exam (Breast exam no pap ) ?[name] is a 61 y.o. female who presents today for her Complete Annual Exam. She feels well. She reports exercising- yoga/bungee. She reports she is sleeping well. Breast complaints - none. ? ?Mammogram: 02/2021 ?DEXA: none ?Pap smear: 07/2020 neg with co-testing ?Colonoscopy: 10/2019 ? ?There are no preventive care reminders to display for this patient. ?  ?Immunization History  ?Administered Date(s) Administered  ? Influenza Inj Mdck Quad Pf 03/19/2017, 02/19/2018, 03/04/2019  ? Influenza, Seasonal, Injecte, Preservative Fre 02/03/2008  ? Influenza,inj,Quad PF,6+ Mos 03/20/2013  ? Influenza-Unspecified 04/18/2017, 02/02/2020, 03/21/2021  ? PFIZER(Purple Top)SARS-COV-2 Vaccination 08/18/2019, 09/17/2019  ? Tdap 02/03/2008, 02/03/2009  ? ? ?Hypertension ?This is a chronic problem. The problem is controlled. Pertinent negatives include no chest pain, headaches, palpitations or shortness of breath. Past treatments include ACE inhibitors. There is no history of kidney disease, CAD/MI or CVA. Identifiable causes of hypertension include a thyroid problem.  ?Thyroid Problem ?Presents for follow-up visit. Patient reports no anxiety, constipation, diarrhea, fatigue, palpitations or tremors. The symptoms have been stable.  ? ?Lab Results  ?Component Value Date  ? NA 140 03/03/2020  ? K 4.3 06/10/2020  ? CO2 25 03/03/2020  ? GLUCOSE 85 03/03/2020  ? BUN 19 03/03/2020  ? CREATININE 0.88 03/03/2020  ? CALCIUM 9.7 03/03/2020  ? GFRNONAA 74 03/03/2020  ? ?Lab Results  ?Component Value Date  ? CHOL 245 (H) 03/03/2020  ? HDL 70 03/03/2020  ? LDLCALC 166 (H) 03/03/2020  ? TRIG 58 03/03/2020  ? ?Lab Results  ?Component Value Date  ? TSH 0.840 06/10/2020  ? ?Lab Results  ?Component Value Date  ? HGBA1C 5.5 03/28/2013  ? ?Lab Results  ?Component Value Date  ? WBC 7.4 06/10/2020  ? HGB  13.2 06/10/2020  ? HCT 38.8 06/10/2020  ? MCV 91 06/10/2020  ? PLT 290 06/10/2020  ? ?Lab Results  ?Component Value Date  ? ALT 17 03/03/2020  ? AST 15 03/03/2020  ? ALKPHOS 90 03/03/2020  ? BILITOT 0.4 03/03/2020  ? ?Lab Results  ?Component Value Date  ? VD25OH 58.3 06/10/2020  ?  ? ?Review of Systems  ?Constitutional:  Negative for chills, fatigue and fever.  ?HENT:  Negative for congestion, hearing loss, tinnitus, trouble swallowing and voice change.   ?Eyes:  Negative for visual disturbance.  ?Respiratory:  Negative for cough, chest tightness, shortness of breath and wheezing.   ?Cardiovascular:  Negative for chest pain, palpitations and leg swelling.  ?Gastrointestinal:  Negative for abdominal pain, constipation, diarrhea and vomiting.  ?Endocrine: Negative for polydipsia and polyuria.  ?Genitourinary:  Negative for dysuria, frequency, genital sores, vaginal bleeding and vaginal discharge.  ?Musculoskeletal:  Positive for arthralgias (toe pain). Negative for gait problem and joint swelling.  ?Skin:  Negative for color change and rash.  ?Neurological:  Negative for dizziness, tremors, light-headedness and headaches.  ?Hematological:  Negative for adenopathy. Does not bruise/bleed easily.  ?Psychiatric/Behavioral:  Negative for dysphoric mood and sleep disturbance. The patient is not nervous/anxious.   ? ?Patient Active Problem List  ? Diagnosis Date Noted  ? Essential hypertension 07/22/2021  ? Acquired hypothyroidism 07/22/2021  ? Bilateral leg cramps 07/22/2021  ? Arthralgia of both ankles 08/13/2018  ? Restless leg syndrome 06/13/2017  ? Iron deficiency 06/13/2017  ? Varicose veins of left lower extremity with inflammation  06/13/2017  ? Bilateral hand numbness 03/20/2013  ? Psoriasis 03/20/2013  ? Chronic low back pain 03/20/2013  ? Obesity (BMI 30-39.9) 07/22/2012  ? ? ?No Known Allergies ? ?Past Surgical History:  ?Procedure Laterality Date  ? ABDOMINAL HYSTERECTOMY  2007  ? endometriosis, [name]   ? COLONOSCOPY WITH PROPOFOL N/A 10/31/2019  ? Procedure: COLONOSCOPY WITH PROPOFOL;  Surgeon: [name], MD;  Location: [facility];  Service: Endoscopy;  Laterality: N/A;  priority 4  ? VASCULAR SURGERY Left   ? left leg  ? ? ?Social History  ? ?Tobacco Use  ? Smoking status: Former  ?  Types: Cigarettes  ?  Quit date: 2008  ?  Years since quitting: 15.3  ? Smokeless tobacco: Never  ?Vaping Use  ? Vaping Use: Never used  ?Substance Use Topics  ? Alcohol use: Yes  ?  Comment: occasional  ? Drug use: No  ? ? ? ?Medication list has been reviewed and updated. ? ?Current Meds  ?Medication Sig  ? enalapril (VASOTEC) 10 MG tablet Take 1 tablet (10 mg total) by mouth daily.  ? levothyroxine (SYNTHROID) 88 MCG tablet Take 1 tablet (88 mcg total) by mouth daily.  ? rOPINIRole (REQUIP) 0.5 MG tablet Take one tab po qhs for restless legs  ? ? ? ?  07/22/2021  ?  2:48 PM  ?GAD 7 : Generalized Anxiety Score  ?Nervous, Anxious, on Edge 0  ?Control/stop worrying 0  ?Worry too much - different things 0  ?Trouble relaxing 0  ?Restless 0  ?Easily annoyed or irritable 0  ?Afraid - awful might happen 0  ?Total GAD 7 Score 0  ? ? ? ?  07/22/2021  ?  2:48 PM  ?Depression screen PHQ 2/9  ?Decreased Interest 1  ?Down, Depressed, Hopeless 0  ?PHQ - 2 Score 1  ?Altered sleeping 2  ?Tired, decreased energy 0  ?Change in appetite 2  ?Feeling bad or failure about yourself  0  ?Trouble concentrating 0  ?Moving slowly or fidgety/restless 0  ?Suicidal thoughts 0  ?PHQ-9 Score 5  ?Difficult doing work/chores Somewhat difficult  ? ? ?BP Readings from Last 3 Encounters:  ?09/07/21 132/78  ?07/22/21 118/78  ?12/23/20 136/72  ? ? ?Physical Exam ?Vitals and nursing note reviewed.  ?Constitutional:   ?   General: She is not in acute distress. ?   Appearance: She is well-developed.  ?HENT:  ?   Head: Normocephalic and atraumatic.  ?   Right Ear: Tympanic membrane and ear canal normal.  ?   Left Ear: Tympanic membrane and ear canal normal.  ?    Nose:  ?   Right Sinus: No maxillary sinus tenderness.  ?   Left Sinus: No maxillary sinus tenderness.  ?Eyes:  ?   General: No scleral icterus.    ?   Right eye: No discharge.     ?   Left eye: No discharge.  ?   Conjunctiva/sclera: Conjunctivae normal.  ?Neck:  ?   Thyroid: No thyromegaly.  ?   Vascular: No carotid bruit.  ?Cardiovascular:  ?   Rate and Rhythm: Normal rate and regular rhythm.  ?   Pulses: Normal pulses.  ?   Heart sounds: Normal heart sounds.  ?Pulmonary:  ?   Effort: Pulmonary effort is normal. No respiratory distress.  ?   Breath sounds: No wheezing.  ?Chest:  ?Breasts: ?   Right: No mass, nipple discharge, skin change or tenderness.  ?   Left:  No mass, nipple discharge, skin change or tenderness.  ?Abdominal:  ?   General: Bowel sounds are normal.  ?   Palpations: Abdomen is soft.  ?   Tenderness: There is no abdominal tenderness.  ?Musculoskeletal:  ?   Cervical back: Normal range of motion. No erythema.  ?   Right lower leg: No edema.  ?   Left lower leg: No edema.  ?Lymphadenopathy:  ?   Cervical: No cervical adenopathy.  ?Skin: ?   General: Skin is warm and dry.  ?   Findings: No rash.  ?Neurological:  ?   General: No focal deficit present.  ?   Mental Status: She is alert and oriented to person, place, and time.  ?   Cranial Nerves: No cranial nerve deficit.  ?   Sensory: No sensory deficit.  ?   Deep Tendon Reflexes: Reflexes are normal and symmetric.  ?Psychiatric:     ?   Attention and Perception: Attention normal.     ?   Mood and Affect: Mood normal.  ? ? ?Wt Readings from Last 3 Encounters:  ?09/07/21 186 lb (84.4 kg)  ?07/22/21 188 lb (85.3 kg)  ?12/23/20 182 lb 12.8 oz (82.9 kg)  ? ? ?BP 132/78   Pulse 78   Ht '5\' 4"'$  (1.626 m)   Wt 186 lb (84.4 kg)   SpO2 100%   BMI 31.93 kg/m?  ? ?Assessment and Plan: ?1. Annual physical exam ?Exam is normal except for weight. Encourage regular exercise and appropriate dietary changes. ?Declines Shingrix now. ?Up to date on screenings and  other immunizations. ?- Hemoglobin A1c ?- Lipid panel ? ?2. Encounter for screening mammogram for breast cancer ?Schedule in October ?- MM 3D SCREEN BREAST BILATERAL ? ?3. Essential hypertension ?Clinically stable

## 2021-09-08 LAB — CBC WITH DIFFERENTIAL/PLATELET
Basophils Absolute: 0 10*3/uL (ref 0.0–0.2)
Basos: 0 %
EOS (ABSOLUTE): 0.1 10*3/uL (ref 0.0–0.4)
Eos: 3 %
Hematocrit: 40.4 % (ref 34.0–46.6)
Hemoglobin: 13.7 g/dL (ref 11.1–15.9)
Immature Grans (Abs): 0 10*3/uL (ref 0.0–0.1)
Immature Granulocytes: 0 %
Lymphocytes Absolute: 1.1 10*3/uL (ref 0.7–3.1)
Lymphs: 32 %
MCH: 31.6 pg (ref 26.6–33.0)
MCHC: 33.9 g/dL (ref 31.5–35.7)
MCV: 93 fL (ref 79–97)
Monocytes Absolute: 0.4 10*3/uL (ref 0.1–0.9)
Monocytes: 13 %
Neutrophils Absolute: 1.8 10*3/uL (ref 1.4–7.0)
Neutrophils: 52 %
Platelets: 239 10*3/uL (ref 150–450)
RBC: 4.33 x10E6/uL (ref 3.77–5.28)
RDW: 12 % (ref 11.7–15.4)
WBC: 3.5 10*3/uL (ref 3.4–10.8)

## 2021-09-08 LAB — COMPREHENSIVE METABOLIC PANEL
ALT: 18 IU/L (ref 0–32)
AST: 20 IU/L (ref 0–40)
Albumin/Globulin Ratio: 2 (ref 1.2–2.2)
Albumin: 4.5 g/dL (ref 3.8–4.9)
Alkaline Phosphatase: 95 IU/L (ref 44–121)
BUN/Creatinine Ratio: 18 (ref 9–23)
BUN: 16 mg/dL (ref 6–24)
Bilirubin Total: 0.4 mg/dL (ref 0.0–1.2)
CO2: 24 mmol/L (ref 20–29)
Calcium: 9.7 mg/dL (ref 8.7–10.2)
Chloride: 101 mmol/L (ref 96–106)
Creatinine, Ser: 0.87 mg/dL (ref 0.57–1.00)
Globulin, Total: 2.2 g/dL (ref 1.5–4.5)
Glucose: 86 mg/dL (ref 70–99)
Potassium: 4.3 mmol/L (ref 3.5–5.2)
Sodium: 138 mmol/L (ref 134–144)
Total Protein: 6.7 g/dL (ref 6.0–8.5)
eGFR: 77 mL/min/{1.73_m2} (ref >59)

## 2021-09-08 LAB — LIPID PANEL
Chol/HDL Ratio: 3.3 ratio (ref 0.0–4.4)
Cholesterol, Total: 202 mg/dL — ABNORMAL HIGH (ref 100–199)
HDL: 61 mg/dL (ref >39)
LDL Chol Calc (NIH): 129 mg/dL — ABNORMAL HIGH (ref 0–99)
Triglycerides: 67 mg/dL (ref 0–149)
VLDL Cholesterol Cal: 12 mg/dL (ref 5–40)

## 2021-09-08 LAB — HEMOGLOBIN A1C
Est. average glucose Bld gHb Est-mCnc: 111 mg/dL
Hgb A1c MFr Bld: 5.5 % (ref 4.8–5.6)

## 2021-09-08 LAB — TSH+FREE T4
Free T4: 1.39 ng/dL (ref 0.82–1.77)
TSH: 0.772 u[IU]/mL (ref 0.450–4.500)

## 2022-02-01 ENCOUNTER — Ambulatory Visit: Payer: 59 | Admitting: Dermatology

## 2022-02-27 ENCOUNTER — Other Ambulatory Visit: Payer: Self-pay | Admitting: Internal Medicine

## 2022-02-27 DIAGNOSIS — I10 Essential (primary) hypertension: Secondary | ICD-10-CM

## 2022-02-28 NOTE — Telephone Encounter (Signed)
Requested Prescriptions  Pending Prescriptions Disp Refills  . enalapril (VASOTEC) 10 MG tablet [Pharmacy Med Name: ENALAPRIL MALEATE 10 MG TAB] 90 tablet 0    Sig: Take 1 tablet (10 mg total) by mouth daily.     Cardiovascular:  ACE Inhibitors Passed - 02/27/2022  2:24 PM      Passed - Cr in normal range and within 180 days    Creatinine, Ser  Date Value Ref Range Status  09/07/2021 0.87 0.57 - 1.00 mg/dL Final         Passed - K in normal range and within 180 days    Potassium  Date Value Ref Range Status  09/07/2021 4.3 3.5 - 5.2 mmol/L Final         Passed - Patient is not pregnant      Passed - Last BP in normal range    BP Readings from Last 1 Encounters:  09/07/21 132/78         Passed - Valid encounter within last 6 months    Recent Outpatient Visits          5 months ago Annual physical exam   [place] [facility] at [facility], Jesse Sans, MD   7 months ago Essential hypertension   [place] [facility] at [facility], Jesse Sans, MD      Future Appointments            In 4 weeks Army Melia Jesse Sans, MD [facility] at [facility], [facility]   In 6 months Army Melia, Jesse Sans, MD [place] [facility] at [facility], [facility]

## 2022-03-09 ENCOUNTER — Encounter: Payer: Self-pay | Admitting: Dermatology

## 2022-03-09 ENCOUNTER — Ambulatory Visit: Payer: 59 | Admitting: Dermatology

## 2022-03-09 DIAGNOSIS — Z86018 Personal history of other benign neoplasm: Secondary | ICD-10-CM

## 2022-03-09 DIAGNOSIS — Z1283 Encounter for screening for malignant neoplasm of skin: Secondary | ICD-10-CM

## 2022-03-09 DIAGNOSIS — L814 Other melanin hyperpigmentation: Secondary | ICD-10-CM

## 2022-03-09 DIAGNOSIS — C44719 Basal cell carcinoma of skin of left lower limb, including hip: Secondary | ICD-10-CM | POA: Diagnosis not present

## 2022-03-09 DIAGNOSIS — D492 Neoplasm of unspecified behavior of bone, soft tissue, and skin: Secondary | ICD-10-CM

## 2022-03-09 DIAGNOSIS — D225 Melanocytic nevi of trunk: Secondary | ICD-10-CM

## 2022-03-09 DIAGNOSIS — D485 Neoplasm of uncertain behavior of skin: Secondary | ICD-10-CM | POA: Diagnosis not present

## 2022-03-09 DIAGNOSIS — C4491 Basal cell carcinoma of skin, unspecified: Secondary | ICD-10-CM

## 2022-03-09 DIAGNOSIS — D229 Melanocytic nevi, unspecified: Secondary | ICD-10-CM

## 2022-03-09 DIAGNOSIS — L821 Other seborrheic keratosis: Secondary | ICD-10-CM

## 2022-03-09 DIAGNOSIS — L578 Other skin changes due to chronic exposure to nonionizing radiation: Secondary | ICD-10-CM

## 2022-03-09 HISTORY — DX: Basal cell carcinoma of skin, unspecified: C44.91

## 2022-03-09 NOTE — Progress Notes (Signed)
New Patient Visit  Subjective  [name] is a 61 y.o. female who presents for the following: Annual Exam (Hx of dysplastic nevus with moderate atypia at right upper arm. Bx 03/31/2009).  The patient presents for Total-Body Skin Exam (TBSE) for skin cancer screening and mole check.  The patient has spots, moles and lesions to be evaluated, some may be new or changing and the patient has concerns that these could be cancer.   Review of Systems: No other skin or systemic complaints except as noted in HPI or Assessment and Plan.   Objective  Well appearing patient in no apparent distress; mood and affect are within normal limits.  A focused examination was performed including head, neck, chest, arms, abdomen, legs. Relevant physical exam findings are noted in the Assessment and Plan.  Left lateral Lower Leg 1.0cm pink and brown plaque     Left Inframammary 0.5cm pink and brown thin papule     Right lateral Abdomen 0.4cm thin dark brown papule   Assessment & Plan   History of Dysplastic Nevus. Moderate atypia. Right upper arm. 03/31/2009 - No evidence of recurrence today - Recommend regular full body skin exams - Recommend daily broad spectrum sunscreen SPF 30+ to sun-exposed areas, reapply every 2 hours as needed.  - Call if any new or changing lesions are noted between office visits   Lentigines - Scattered tan macules - Due to sun exposure - Benign-appearing, observe - Recommend daily broad spectrum sunscreen SPF 30+ to sun-exposed areas, reapply every 2 hours as needed. - Call for any changes  Seborrheic Keratoses - Stuck-on, waxy, tan-brown papules and/or plaques  - Benign-appearing - Discussed benign etiology and prognosis. - Observe - Call for any changes  Melanocytic Nevi - Tan-brown and/or pink-flesh-colored symmetric macules and papules - Benign appearing on exam today - Observation - Call clinic for new or changing moles - Recommend daily use of  broad spectrum spf 30+ sunscreen to sun-exposed areas.   Hemangiomas - Red papules - Discussed benign nature - Observe - Call for any changes  Actinic Damage - Chronic condition, secondary to cumulative UV/sun exposure - diffuse scaly erythematous macules with underlying dyspigmentation - Recommend daily broad spectrum sunscreen SPF 30+ to sun-exposed areas, reapply every 2 hours as needed.  - Staying in the shade or wearing long sleeves, sun glasses (UVA+UVB protection) and wide brim hats (4-inch brim around the entire circumference of the hat) are also recommended for sun protection.  - Call for new or changing lesions.  Skin cancer screening performed today.  Neoplasm of skin (2) Left lateral Lower Leg  Epidermal / dermal shaving  Lesion diameter (cm):  1 Informed consent: discussed and consent obtained   Patient was prepped and draped in usual sterile fashion: Area prepped with alcohol. Anesthesia: the lesion was anesthetized in a standard fashion   Anesthetic:  1% lidocaine w/ epinephrine 1-100,000 buffered w/ 8.4% NaHCO3 Instrument used: flexible razor blade   Hemostasis achieved with: pressure, aluminum chloride and electrodesiccation   Outcome: patient tolerated procedure well   Post-procedure details: wound care instructions given   Post-procedure details comment:  Ointment and small bandage applied  Left Inframammary  Epidermal / dermal shaving  Lesion diameter (cm):  0.5 Informed consent: discussed and consent obtained   Patient was prepped and draped in usual sterile fashion: Area prepped with alcohol. Anesthesia: the lesion was anesthetized in a standard fashion   Anesthetic:  1% lidocaine w/ epinephrine 1-100,000 buffered w/ 8.4% NaHCO3  Instrument used: flexible razor blade   Hemostasis achieved with: pressure, aluminum chloride and electrodesiccation   Outcome: patient tolerated procedure well   Post-procedure details: wound care instructions given    Post-procedure details comment:  Ointment and small bandage applied  Related Procedures Anatomic Pathology Report  Nevus Right lateral Abdomen  Benign-appearing.  Recommend observation for change.  Call clinic for appointment for evaluation of any new or changing lesions.    Recheck in 3 months   Return in about 2 weeks (around 03/23/2022) for Wound check and check back of body.  I, Emelia Salisbury, CMA, am acting as scribe for Forest Gleason, MD.  Documentation: I have reviewed the above documentation for accuracy and completeness, and I agree with the above.  Forest Gleason, MD

## 2022-03-09 NOTE — Patient Instructions (Addendum)
Wound Care Instructions  Cleanse wound gently with soap and water once a day then pat dry with clean gauze. Apply a thin coat of Petrolatum (petroleum jelly, "Vaseline") over the wound (unless you have an allergy to this). We recommend that you use a new, sterile tube of Vaseline. Do not pick or remove scabs. Do not remove the yellow or white "healing tissue" from the base of the wound.  Cover the wound with fresh, clean, nonstick gauze and secure with paper tape. You may use Band-Aids in place of gauze and tape if the wound is small enough, but would recommend trimming much of the tape off as there is often too much. Sometimes Band-Aids can irritate the skin.  You should call the office for your biopsy report after 1 week if you have not already been contacted.  If you experience any problems, such as abnormal amounts of bleeding, swelling, significant bruising, significant pain, or evidence of infection, please call the office immediately.  FOR ADULT SURGERY PATIENTS: If you need something for pain relief you may take 1 extra strength Tylenol (acetaminophen) AND 2 Ibuprofen (200mg each) together every 4 hours as needed for pain. (do not take these if you are allergic to them or if you have a reason you should not take them.) Typically, you may only need pain medication for 1 to 3 days.       Recommend taking Heliocare sun protection supplement daily in sunny weather for additional sun protection. For maximum protection on the sunniest days, you can take up to 2 capsules of regular Heliocare OR take 1 capsule of Heliocare Ultra. For prolonged exposure (such as a full day in the sun), you can repeat your dose of the supplement 4 hours after your first dose. Heliocare can be purchased at [facility], at some [facility] or at [email or web address].     Recommend daily broad spectrum sunscreen SPF 30+ to sun-exposed areas, reapply every 2 hours as needed. Call for new or changing lesions.   Staying in the shade or wearing long sleeves, sun glasses (UVA+UVB protection) and wide brim hats (4-inch brim around the entire circumference of the hat) are also recommended for sun protection.    Melanoma ABCDEs  Melanoma is the most dangerous type of skin cancer, and is the leading cause of death from skin disease.  You are more likely to develop melanoma if you: Have light-colored skin, light-colored eyes, or red or blond hair Spend a lot of time in the sun Tan regularly, either outdoors or in a tanning bed Have had blistering sunburns, especially during childhood Have a close family member who has had a melanoma Have atypical moles or large birthmarks  Early detection of melanoma is key since treatment is typically straightforward and cure rates are extremely high if we catch it early.   The first sign of melanoma is often a change in a mole or a new dark spot.  The ABCDE system is a way of remembering the signs of melanoma.  A for asymmetry:  The two halves do not match. B for border:  The edges of the growth are irregular. C for color:  A mixture of colors are present instead of an even brown color. D for diameter:  Melanomas are usually (but not always) greater than 6mm - the size of a pencil eraser. E for evolution:  The spot keeps changing in size, shape, and color.  Please check your skin once per month between visits. You can use   a small mirror in front and a large mirror behind you to keep an eye on the back side or your body.   If you see any new or changing lesions before your next follow-up, please call to schedule a visit.  Please continue daily skin protection including broad spectrum sunscreen SPF 30+ to sun-exposed areas, reapplying every 2 hours as needed when you're outdoors.   Staying in the shade or wearing long sleeves, sun glasses (UVA+UVB protection) and wide brim hats (4-inch brim around the entire circumference of the hat) are also recommended for sun  protection.    Due to recent changes in healthcare laws, you may see results of your pathology and/or laboratory studies on MyChart before the doctors have had a chance to review them. We understand that in some cases there may be results that are confusing or concerning to you. Please understand that not all results are received at the same time and often the doctors may need to interpret multiple results in order to provide you with the best plan of care or course of treatment. Therefore, we ask that you please give us 2 business days to thoroughly review all your results before contacting the office for clarification. Should we see a critical lab result, you will be contacted sooner.   If You Need Anything After Your Visit  If you have any questions or concerns for your doctor, please call our main line at [phone] and press option 4 to reach your doctor's medical assistant. If no one answers, please leave a voicemail as directed and we will return your call as soon as possible. Messages left after 4 pm will be answered the following business day.   You may also send us a message via MyChart. We typically respond to MyChart messages within 1-2 business days.  For prescription refills, please ask your pharmacy to contact our office. Our fax number is [phone].  If you have an urgent issue when the clinic is closed that cannot wait until the next business day, you can page your doctor at the number below.    Please note that while we do our best to be available for urgent issues outside of office hours, we are not available 24/7.   If you have an urgent issue and are unable to reach us, you may choose to seek medical care at your doctor's office, retail clinic, urgent care center, or emergency room.  If you have a medical emergency, please immediately call 911 or go to the emergency department.  Pager Numbers  - Dr. Kowalski: [phone]  - Dr. Moye: [phone]  - Dr. Stewart:  [phone]  In the event of inclement weather, please call our main line at [phone] for an update on the status of any delays or closures.  Dermatology Medication Tips: Please keep the boxes that topical medications come in in order to help keep track of the instructions about where and how to use these. Pharmacies typically print the medication instructions only on the boxes and not directly on the medication tubes.   If your medication is too expensive, please contact our office at [phone] option 4 or send us a message through MyChart.   We are unable to tell what your co-pay for medications will be in advance as this is different depending on your insurance coverage. However, we may be able to find a substitute medication at lower cost or fill out paperwork to get insurance to cover a needed medication.   If a   prior authorization is required to get your medication covered by your insurance company, please allow us 1-2 business days to complete this process.  Drug prices often vary depending on where the prescription is filled and some pharmacies may offer cheaper prices.  The website www.goodrx.com contains coupons for medications through different pharmacies. The prices here do not account for what the cost may be with help from insurance (it may be cheaper with your insurance), but the website can give you the price if you did not use any insurance.  - You can print the associated coupon and take it with your prescription to the pharmacy.  - You may also stop by our office during regular business hours and pick up a GoodRx coupon card.  - If you need your prescription sent electronically to a different pharmacy, notify our office through [place] MyChart or by phone at [phone] option 4.     Si Usted Necesita Algo Despus de Su Visita  Tambin puede enviarnos un mensaje a travs de MyChart. Por lo general respondemos a los mensajes de MyChart en el transcurso de 1 a 2  das hbiles.  Para renovar recetas, por favor pida a su farmacia que se ponga en contacto con nuestra oficina. Nuestro nmero de fax es el [phone].  Si tiene un asunto urgente cuando la clnica est cerrada y que no puede esperar hasta el siguiente da hbil, puede llamar/localizar a su doctor(a) al nmero que aparece a continuacin.   Por favor, tenga en cuenta que aunque hacemos todo lo posible para estar disponibles para asuntos urgentes fuera del horario de oficina, no estamos disponibles las 24 horas del da, los 7 das de la semana.   Si tiene un problema urgente y no puede comunicarse con nosotros, puede optar por buscar atencin mdica  en el consultorio de su doctor(a), en una clnica privada, en un centro de atencin urgente o en una sala de emergencias.  Si tiene una emergencia mdica, por favor llame inmediatamente al 911 o vaya a la sala de emergencias.  Nmeros de bper  - Dr. Kowalski: [phone]  - Dra. Moye: [phone]  - Dra. Stewart: [phone]  En caso de inclemencias del tiempo, por favor llame a nuestra lnea principal al [phone] para una actualizacin sobre el estado de cualquier retraso o cierre.  Consejos para la medicacin en dermatologa: Por favor, guarde las cajas en las que vienen los medicamentos de uso tpico para ayudarle a seguir las instrucciones sobre dnde y cmo usarlos. Las farmacias generalmente imprimen las instrucciones del medicamento slo en las cajas y no directamente en los tubos del medicamento.   Si su medicamento es muy caro, por favor, pngase en contacto con nuestra oficina llamando al [phone] y presione la opcin 4 o envenos un mensaje a travs de MyChart.   No podemos decirle cul ser su copago por los medicamentos por adelantado ya que esto es diferente dependiendo de la cobertura de su seguro. Sin embargo, es posible que podamos encontrar un medicamento sustituto a menor costo o llenar un formulario para que el  seguro cubra el medicamento que se considera necesario.   Si se requiere una autorizacin previa para que su compaa de seguros cubra su medicamento, por favor permtanos de 1 a 2 das hbiles para completar este proceso.  Los precios de los medicamentos varan con frecuencia dependiendo del lugar de dnde se surte la receta y alguna farmacias pueden ofrecer precios ms baratos.  El sitio web www.goodrx.com tiene cupones   para medicamentos de diferentes farmacias. Los precios aqu no tienen en cuenta lo que podra costar con la ayuda del seguro (puede ser ms barato con su seguro), pero el sitio web puede darle el precio si no utiliz ningn seguro.  - Puede imprimir el cupn correspondiente y llevarlo con su receta a la farmacia.  - Tambin puede pasar por nuestra oficina durante el horario de atencin regular y recoger una tarjeta de cupones de GoodRx.  - Si necesita que su receta se enve electrnicamente a una farmacia diferente, informe a nuestra oficina a travs de MyChart de [place] o por telfono llamando al [phone] y presione la opcin 4.  

## 2022-03-10 ENCOUNTER — Other Ambulatory Visit: Payer: Self-pay | Admitting: Internal Medicine

## 2022-03-10 ENCOUNTER — Ambulatory Visit: Payer: 59 | Admitting: Internal Medicine

## 2022-03-10 DIAGNOSIS — E039 Hypothyroidism, unspecified: Secondary | ICD-10-CM

## 2022-03-10 NOTE — Telephone Encounter (Signed)
Requested Prescriptions  Pending Prescriptions Disp Refills  . levothyroxine (SYNTHROID) 88 MCG tablet [Pharmacy Med Name: LEVOTHYROXINE 88 MCG TABLET] 90 tablet 1    Sig: Take 1 tablet (88 mcg total) by mouth daily.     Endocrinology:  Hypothyroid Agents Passed - 03/10/2022  9:22 AM      Passed - TSH in normal range and within 360 days    TSH  Date Value Ref Range Status  09/07/2021 0.772 0.450 - 4.500 uIU/mL Final         Passed - Valid encounter within last 12 months    Recent Outpatient Visits          6 months ago Annual physical exam   [place] [facility] at [facility], Jesse Sans, MD   7 months ago Essential hypertension   [place] [facility] at [facility], Jesse Sans, MD      Future Appointments            In 1 week [facility] [place], [place], MD [place]   In 2 weeks Army Melia, Jesse Sans, MD [facility] at [facility], [facility]   In 6 months Army Melia, Jesse Sans, MD [place] [facility] at [facility], [facility]

## 2022-03-13 ENCOUNTER — Encounter: Payer: Self-pay | Admitting: Dermatology

## 2022-03-15 ENCOUNTER — Telehealth: Payer: Self-pay

## 2022-03-15 LAB — ANATOMIC PATHOLOGY REPORT

## 2022-03-15 NOTE — Telephone Encounter (Signed)
Discussed pathology results. Patient voiced understanding. Will Tx with EDC at next visit.

## 2022-03-15 NOTE — Telephone Encounter (Signed)
-----   Message from Alfonso Patten, MD sent at 03/15/2022  1:57 PM EDT ----- Specimen A-Skin Biopsy, Shave, left lateral lower leg:  BASAL CELL CARCINOMA, SUPERFICIAL TYPE. --> ED&C at follow-up  Specimen B-Skin Biopsy, Shave, left inframammary: SOLAR  LENTIGO AND PIGMENT INCONTINENCE. NO ATYPIA OR MALIGNANCY.  --> Sun spot/freckle. No atypical cells, no treatment needed.  MAs please call. Thank you!

## 2022-03-21 ENCOUNTER — Ambulatory Visit: Payer: 59

## 2022-03-23 ENCOUNTER — Ambulatory Visit: Payer: 59 | Admitting: Dermatology

## 2022-03-23 ENCOUNTER — Encounter: Payer: Self-pay | Admitting: Dermatology

## 2022-03-23 DIAGNOSIS — L814 Other melanin hyperpigmentation: Secondary | ICD-10-CM | POA: Diagnosis not present

## 2022-03-23 DIAGNOSIS — D229 Melanocytic nevi, unspecified: Secondary | ICD-10-CM

## 2022-03-23 DIAGNOSIS — L821 Other seborrheic keratosis: Secondary | ICD-10-CM | POA: Diagnosis not present

## 2022-03-23 DIAGNOSIS — Z1283 Encounter for screening for malignant neoplasm of skin: Secondary | ICD-10-CM

## 2022-03-23 DIAGNOSIS — L578 Other skin changes due to chronic exposure to nonionizing radiation: Secondary | ICD-10-CM

## 2022-03-23 DIAGNOSIS — C44719 Basal cell carcinoma of skin of left lower limb, including hip: Secondary | ICD-10-CM

## 2022-03-23 NOTE — Patient Instructions (Addendum)
Start Imiquimod cream once daily for 6 weeks to leg once healed. Patient will call when healed, will send Rx.    Apply Strata GRT twice daily to area on leg.    Recommend taking Heliocare sun protection supplement daily in sunny weather for additional sun protection. For maximum protection on the sunniest days, you can take up to 2 capsules of regular Heliocare OR take 1 capsule of Heliocare Ultra. For prolonged exposure (such as a full day in the sun), you can repeat your dose of the supplement 4 hours after your first dose. Heliocare can be purchased at [employer], at some [facility] or at [email or web address].     Some Self-Tanner Suggestions include - Sublime wipes by [place]  Melanoma is the most dangerous type of skin cancer, and is the leading cause of death from skin disease.  You are more likely to develop melanoma if you: Have light-colored skin, light-colored eyes, or red or blond hair Spend a lot of time in the sun Tan regularly, either outdoors or in a tanning bed Have had blistering sunburns, especially during childhood Have a close family member who has had a melanoma Have atypical moles or large birthmarks  Early detection of melanoma is key since treatment is typically straightforward and cure rates are extremely high if we catch it early.   The first sign of melanoma is often a change in a mole or a new dark spot.  The ABCDE system is a way of remembering the signs of melanoma.  A for asymmetry:  The two halves do not match. B for border:  The edges of the growth are irregular. C for color:  A mixture of colors are present instead of an even brown color. D for diameter:  Melanomas are usually (but not always) greater than 7m - the size of a pencil eraser. E for evolution:  The spot keeps changing in size, shape, and color.  Please check your skin once per month between visits. You can use a small mirror in  front and a large mirror behind you to keep an eye on the back side or your body.   If you see any new or changing lesions before your next follow-up, please call to schedule a visit.  Please continue daily skin protection including broad spectrum sunscreen SPF 30+ to sun-exposed areas, reapplying every 2 hours as needed when you're outdoors.   Staying in the shade or wearing long sleeves, sun glasses (UVA+UVB protection) and wide brim hats (4-inch brim around the entire circumference of the hat) are also recommended for sun protection.     Due to recent changes in healthcare laws, you may see results of your pathology and/or laboratory studies on MyChart before the doctors have had a chance to review them. We understand that in some cases there may be results that are confusing or concerning to you. Please understand that not all results are received at the same time and often the doctors may need to interpret multiple results in order to provide you with the best plan of care or course of treatment. Therefore, we ask that you please give u[place]2 business days to thoroughly review all your results before contacting the office for clarification. Should we see a critical lab result, you will be contacted sooner.   If You Need Anything After Your Visit  If you have any questions or concerns for your doctor, please call  our main line at [phone] and press option 4 to reach your doctor's medical assistant. If no one answers, please leave a voicemail as directed and we will return your call as soon as possible. Messages left after 4 pm will be answered the following business day.   You may also send [place] a message via [place]. We typically respond to MyChart messages within 1-2 business days.  For prescription refills, please ask your pharmacy to contact our office. Our fax number is [phone].  If you have an urgent issue when the clinic is closed that cannot wait until the next business day, you  can page your doctor at the number below.    Please note that while we do our best to be available for urgent issues outside of office hours, we are not available 24/7.   If you have an urgent issue and are unable to reach [place], you may choose to seek medical care at your doctor's office, retail clinic, urgent care center, or emergency room.  If you have a medical emergency, please immediately call 911 or go to the emergency department.  Pager Numbers  - Dr. Nehemiah Massed: [phone]  - Dr. Laurence Ferrari: [phone]  - Dr. Nicole Kindred: [phone]  In the event of inclement weather, please call our main line at [phone] for an update on the status of any delays or closures.  Dermatology Medication Tips: Please keep the boxes that topical medications come in in order to help keep track of the instructions about where and how to use these. Pharmacies typically print the medication instructions only on the boxes and not directly on the medication tubes.   If your medication is too expensive, please contact our office at [phone] option 4 or send [place] a message through [place].   We are unable to tell what your co-pay for medications will be in advance as this is different depending on your insurance coverage. However, we may be able to find a substitute medication at lower cost or fill out paperwork to get insurance to cover a needed medication.   If a prior authorization is required to get your medication covered by your insurance company, please allow [place] 1-2 business days to complete this process.  Drug prices often vary depending on where the prescription is filled and some pharmacies may offer cheaper prices.  The website www.goodrx.com contains coupons for medications through different pharmacies. The prices here do not account for what the cost may be with help from insurance (it may be cheaper with your insurance), but the website can give you the price if you did not use any insurance.  -  You can print the associated coupon and take it with your prescription to the pharmacy.  - You may also stop by our office during regular business hours and pick up a GoodRx coupon card.  - If you need your prescription sent electronically to a different pharmacy, notify our office through [facility] or by phone at [phone] option 4.     Si Usted Necesita Algo Despus de Su Visita  Tambin puede enviarnos un mensaje a travs de Pharmacist, community. Por lo general respondemos a los mensajes de MyChart en el transcurso de 1 a 2 das hbiles.  Para renovar recetas, por favor pida a su farmacia que se ponga en contacto con nuestra oficina. Harland Dingwall de fax es [place] [phone].  Si tiene un asunto urgente cuando la clnica est cerrada y que no puede esperar hasta el siguiente da hbil, [place]  llamar/localizar a su doctor(a) al nmero que aparece a continuacin.   Por favor, tenga en cuenta que aunque hacemos todo lo posible para estar disponibles para asuntos urgentes fuera del horario de [place], no estamos disponibles las 24 horas del da, los 7 das de la [place].   Si tiene un problema urgente y no puede comunicarse con nosotros, puede optar por buscar atencin mdica  en el consultorio de su doctor(a), en una clnica privada, en un centro de atencin urgente o en una sala de emergencias.  Si tiene Engineering geologist, por favor llame inmediatamente al 911 o vaya a la sala de emergencias.  Nmeros de bper  - Dr. Nehemiah Massed: [phone]  - Dra. Moye: [phone]  - Dra. Nicole Kindred: [phone]  En caso de inclemencias del [place], por favor llame a Johnsie Kindred principal al [phone] para una actualizacin sobre el [place] de cualquier retraso o cierre.  Consejos para la medicacin en dermatologa: Por favor, guarde las cajas en las que vienen los medicamentos de uso tpico para ayudarle a seguir las instrucciones sobre dnde y cmo usarlos. Las farmacias generalmente imprimen  las instrucciones del medicamento slo en las cajas y no directamente en los tubos del [place].   Si su medicamento es muy caro, por favor, pngase en contacto con Zigmund Daniel llamando al [phone] y presione la opcin 4 o envenos un mensaje a travs de Pharmacist, community.   No podemos decirle cul ser su copago por los medicamentos por adelantado ya que esto es diferente dependiendo de la cobertura de su seguro. Sin embargo, es posible que podamos encontrar un medicamento sustituto a Electrical engineer un formulario para que el seguro cubra el medicamento que se considera necesario.   Si se requiere una autorizacin previa para que su compaa de seguros [place] su medicamento, por favor permtanos de 1 a 2 das hbiles para completar este proceso.  Los precios de los medicamentos varan con frecuencia dependiendo del Environmental consultant de dnde se surte la receta y alguna farmacias pueden ofrecer precios ms baratos.  El sitio web www.goodrx.com tiene cupones para medicamentos de Airline pilot. Los precios aqu no tienen en cuenta lo que podra costar con la ayuda del seguro (puede ser ms barato con su seguro), pero el sitio web puede darle el precio si no utiliz Research scientist (physical sciences).  - Puede imprimir el cupn correspondiente y llevarlo con su receta a la farmacia.  - Tambin puede pasar por nuestra oficina durante el horario de atencin regular y Charity fundraiser una tarjeta de cupones de GoodRx.  - Si necesita que su receta se enve electrnicamente a una farmacia diferente, informe a nuestra oficina a travs de MyChart de [place] o por telfono llamando al [phone] y presione la opcin 4.

## 2022-03-23 NOTE — Progress Notes (Signed)
   Follow-Up Visit   Subjective  [name] is a 61 y.o. female who presents for the following: Annual Exam (Here to check backside of body) and Skin Cancer (BCC. Left lateral lower leg. Discuss treatment options).  The patient presents for Total-Body Skin Exam (TBSE) for skin cancer screening and mole check.  The patient has spots, moles and lesions to be evaluated, some may be new or changing and the patient has concerns that these could be cancer.  The following portions of the chart were reviewed this encounter and updated as appropriate:  Tobacco  Allergies  Meds  Problems  Med Hx  Surg Hx  Fam Hx      Review of Systems: No other skin or systemic complaints except as noted in HPI or Assessment and Plan.   Objective  Well appearing patient in no apparent distress; mood and affect are within normal limits.  A full examination of the backside of her body was performed including scalp, head, ears, neck, back, buttocks, bilateral upper extremities, bilateral lower extremities, feet. All findings within normal limits unless otherwise noted below.  Left Lateral Lower Leg Ulcerated healing wound   Assessment & Plan  Basal cell carcinoma (BCC) of skin of left lower extremity including hip Left Lateral Lower Leg  Superficial type  Use Strata GRT twice daily. Sample given today.  Discussed Tx options EDC, Imiquimod.  Start Imiquimod cream once daily for 6 weeks to leg once healed. Patient will call when healed, will send Rx.     Lentigines - Scattered tan macules - Due to sun exposure - Benign-appearing, observe - Recommend daily broad spectrum sunscreen SPF 30+ to sun-exposed areas, reapply every 2 hours as needed. - Call for any changes  Seborrheic Keratoses - Stuck-on, waxy, tan-brown papules and/or plaques  - Benign-appearing - Discussed benign etiology and prognosis. - Observe - Call for any changes  Melanocytic Nevi - Tan-brown and/or pink-flesh-colored  symmetric macules and papules - Benign appearing on exam today - Observation - Call clinic for new or changing moles - Recommend daily use of broad spectrum spf 30+ sunscreen to sun-exposed areas.   Hemangiomas - Red papules - Discussed benign nature - Observe - Call for any changes  Actinic Damage - Chronic condition, secondary to cumulative UV/sun exposure - diffuse scaly erythematous macules with underlying dyspigmentation - Recommend daily broad spectrum sunscreen SPF 30+ to sun-exposed areas, reapply every 2 hours as needed.  - Staying in the shade or wearing long sleeves, sun glasses (UVA+UVB protection) and wide brim hats (4-inch brim around the entire circumference of the hat) are also recommended for sun protection.  - Call for new or changing lesions.  Skin cancer screening performed today.  Return in about 6 months (around 09/21/2022) for TBSE, HxBCC.  I, Emelia Salisbury, CMA, am acting as scribe for Forest Gleason, MD.  Documentation: I have reviewed the above documentation for accuracy and completeness, and I agree with the above.  Forest Gleason, MD

## 2022-03-27 ENCOUNTER — Encounter: Payer: Self-pay | Admitting: Dermatology

## 2022-03-29 ENCOUNTER — Ambulatory Visit: Payer: 59 | Admitting: Internal Medicine

## 2022-05-09 ENCOUNTER — Ambulatory Visit
Admission: RE | Admit: 2022-05-09 | Discharge: 2022-05-09 | Disposition: A | Discharge status: Home / Self Care | Payer: 59 | Source: Ambulatory Visit | Attending: Internal Medicine | Admitting: Internal Medicine

## 2022-05-09 DIAGNOSIS — Z1231 Encounter for screening mammogram for malignant neoplasm of breast: Secondary | ICD-10-CM | POA: Diagnosis present

## 2022-05-26 ENCOUNTER — Ambulatory Visit: Payer: 59 | Admitting: Internal Medicine

## 2022-06-01 ENCOUNTER — Other Ambulatory Visit: Payer: Self-pay

## 2022-06-01 ENCOUNTER — Encounter: Payer: Self-pay | Admitting: Dermatology

## 2022-06-01 MED ORDER — IMIQUIMOD 5 % EX CREA: TOPICAL_CREAM | CUTANEOUS | 0 refills | Status: DC

## 2022-06-02 ENCOUNTER — Ambulatory Visit: Payer: 59 | Admitting: Internal Medicine

## 2022-06-30 ENCOUNTER — Encounter: Payer: Self-pay | Admitting: Internal Medicine

## 2022-06-30 ENCOUNTER — Ambulatory Visit: Payer: 59 | Admitting: Internal Medicine

## 2022-06-30 VITALS — BP 128/82 | HR 75 | Ht 64.0 in | Wt 195.0 lb

## 2022-06-30 DIAGNOSIS — R252 Cramp and spasm: Secondary | ICD-10-CM | POA: Diagnosis not present

## 2022-06-30 DIAGNOSIS — I10 Essential (primary) hypertension: Secondary | ICD-10-CM | POA: Diagnosis not present

## 2022-06-30 DIAGNOSIS — E039 Hypothyroidism, unspecified: Secondary | ICD-10-CM

## 2022-06-30 DIAGNOSIS — Z23 Encounter for immunization: Secondary | ICD-10-CM | POA: Diagnosis not present

## 2022-06-30 NOTE — Assessment & Plan Note (Signed)
Did not respond to Requip Drinking sufficient fluids Trying Co-Q 10 Could also try Magnesium

## 2022-06-30 NOTE — Addendum Note (Signed)
Addended by: Glean Hess on: 06/30/2022 04:30 PM   Modules accepted: Level of Service

## 2022-06-30 NOTE — Assessment & Plan Note (Signed)
Supplemented Lab Results  Component Value Date   TSH 0.772 09/07/2021

## 2022-06-30 NOTE — Assessment & Plan Note (Signed)
Clinically stable exam with well controlled BP on enalapril. Tolerating medications without side effects. Pt to continue current regimen and low sodium diet.

## 2022-06-30 NOTE — Progress Notes (Addendum)
Date:  06/30/2022   Name:  [name]   DOB:  1961/04/17   MRN:  [id number]   Chief Complaint: Hypertension  Hypertension This is a chronic problem. The problem is controlled. Pertinent negatives include no chest pain, headaches, palpitations or shortness of breath. Past treatments include ACE inhibitors. The current treatment provides significant improvement. There is no history of kidney disease, CAD/MI or CVA.   Leg cramps - still having symptoms despite Requip.  Now trying Co-Q 10.  No real pattern to the cramps but they always occur at night.  Lab Results  Component Value Date   NA 138 09/07/2021   K 4.3 09/07/2021   CO2 24 09/07/2021   GLUCOSE 86 09/07/2021   BUN 16 09/07/2021   CREATININE 0.87 09/07/2021   CALCIUM 9.7 09/07/2021   EGFR 77 09/07/2021   GFRNONAA 74 03/03/2020   Lab Results  Component Value Date   CHOL 202 (H) 09/07/2021   HDL 61 09/07/2021   LDLCALC 129 (H) 09/07/2021   TRIG 67 09/07/2021   CHOLHDL 3.3 09/07/2021   Lab Results  Component Value Date   TSH 0.772 09/07/2021   Lab Results  Component Value Date   HGBA1C 5.5 09/07/2021   Lab Results  Component Value Date   WBC 3.5 09/07/2021   HGB 13.7 09/07/2021   HCT 40.4 09/07/2021   MCV 93 09/07/2021   PLT 239 09/07/2021   Lab Results  Component Value Date   ALT 18 09/07/2021   AST 20 09/07/2021   ALKPHOS 95 09/07/2021   BILITOT 0.4 09/07/2021   Lab Results  Component Value Date   VD25OH 58.3 06/10/2020     Review of Systems  Constitutional:  Negative for fatigue and unexpected weight change.  HENT:  Negative for nosebleeds.   Eyes:  Negative for visual disturbance.  Respiratory:  Negative for cough, chest tightness, shortness of breath and wheezing.   Cardiovascular:  Negative for chest pain, palpitations and leg swelling.  Gastrointestinal:  Negative for abdominal pain, constipation and diarrhea.  Musculoskeletal:  Positive for myalgias.  Neurological:  Negative for  dizziness, weakness, light-headedness and headaches.  Psychiatric/Behavioral:  Negative for dysphoric mood and sleep disturbance. The patient is not nervous/anxious.     Patient Active Problem List   Diagnosis Date Noted   Essential hypertension 07/22/2021   Acquired hypothyroidism 07/22/2021   Bilateral leg cramps 07/22/2021   Arthralgia of both ankles 08/13/2018   Restless leg syndrome 06/13/2017   Iron deficiency 06/13/2017   Varicose veins of left lower extremity with inflammation 06/13/2017   Bilateral hand numbness 03/20/2013   Psoriasis 03/20/2013   Chronic low back pain 03/20/2013   Obesity (BMI 30-39.9) 07/22/2012    No Known Allergies  Past Surgical History:  Procedure Laterality Date   ABDOMINAL HYSTERECTOMY  2007   endometriosis, [name]   COLONOSCOPY WITH PROPOFOL N/A 10/31/2019   Procedure: COLONOSCOPY WITH PROPOFOL;  Surgeon: [name], MD;  Location: [place];  Service: Endoscopy;  Laterality: N/A;  priority 4   VASCULAR SURGERY Left    left leg    Social History   Tobacco Use   Smoking status: Former    Types: Cigarettes    Quit date: 2008    Years since quitting: 16.1   Smokeless tobacco: Never  Vaping Use   Vaping Use: Never used  Substance Use Topics   Alcohol use: Yes    Comment: occasional   Drug use: No  Medication list has been reviewed and updated.  Current Meds  Medication Sig   enalapril (VASOTEC) 10 MG tablet Take 1 tablet (10 mg total) by mouth daily.   imiquimod (ALDARA) 5 % cream Apply to affected area at leg once daily x 6 weeks.   levothyroxine (SYNTHROID) 88 MCG tablet Take 1 tablet (88 mcg total) by mouth daily.   rOPINIRole (REQUIP) 0.5 MG tablet Take one tab po qhs for restless legs       06/30/2022    3:35 PM 09/07/2021    8:08 AM 07/22/2021    2:48 PM  GAD 7 : Generalized Anxiety Score  Nervous, Anxious, on Edge 0 0 0  Control/stop worrying 0 0 0  Worry too much - different things 0 0 0   Trouble relaxing 0 0 0  Restless 0 0 0  Easily annoyed or irritable 0 1 0  Afraid - awful might happen 0 0 0  Total GAD 7 Score 0 1 0  Anxiety Difficulty Not difficult at all Not difficult at all        06/30/2022    3:34 PM 09/07/2021    8:08 AM 07/22/2021    2:48 PM  Depression screen PHQ 2/9  Decreased Interest 0 1 1  Down, Depressed, Hopeless 1 0 0  PHQ - 2 Score 1 1 1  $ Altered sleeping 3 0 2  Tired, decreased energy 2 1 0  Change in appetite 0 2 2  Feeling bad or failure about yourself  3 2 0  Trouble concentrating 2 0 0  Moving slowly or fidgety/restless 0 0 0  Suicidal thoughts 0 0 0  PHQ-9 Score 11 6 5  $ Difficult doing work/chores Somewhat difficult Not difficult at all Somewhat difficult    BP Readings from Last 3 Encounters:  06/30/22 128/82  09/07/21 132/78  07/22/21 118/78    Physical Exam Vitals and nursing note reviewed.  Constitutional:      General: She is not in acute distress.    Appearance: She is well-developed.  HENT:     Head: Normocephalic and atraumatic.  Neck:     Vascular: No carotid bruit.  Cardiovascular:     Rate and Rhythm: Normal rate and regular rhythm.  Pulmonary:     Effort: Pulmonary effort is normal. No respiratory distress.     Breath sounds: No wheezing or rhonchi.  Musculoskeletal:     Cervical back: Normal range of motion.     Right lower leg: No edema.     Left lower leg: No edema.  Lymphadenopathy:     Cervical: No cervical adenopathy.  Skin:    General: Skin is warm and dry.     Capillary Refill: Capillary refill takes less than 2 seconds.     Findings: No rash.  Neurological:     General: No focal deficit present.     Mental Status: She is alert and oriented to person, place, and time.  Psychiatric:        Mood and Affect: Mood normal.        Behavior: Behavior normal.     Wt Readings from Last 3 Encounters:  06/30/22 195 lb (88.5 kg)  09/07/21 186 lb (84.4 kg)  07/22/21 188 lb (85.3 kg)    BP 128/82  (BP Location: Left Arm, Cuff Size: Large)   Pulse 75   Ht 5' 4"$  (1.626 m)   Wt 195 lb (88.5 kg)   SpO2 98%   BMI 33.47 kg/m  Assessment and Plan: Problem List Items Addressed This Visit       Cardiovascular and Mediastinum   Essential hypertension - Primary (Chronic)    Clinically stable exam with well controlled BP on enalapril. Tolerating medications without side effects. Pt to continue current regimen and low sodium diet.         Endocrine   Acquired hypothyroidism (Chronic)    Supplemented Lab Results  Component Value Date   TSH 0.772 09/07/2021          Other   Bilateral leg cramps    Did not respond to Requip Drinking sufficient fluids Trying Co-Q 10 Could also try Magnesium      Other Visit Diagnoses     Need for immunization against influenza       Relevant Orders   Flu Vaccine QUAD 30moIM (Fluarix, Fluzone & Alfiuria Quad PF) (Completed)        Partially dictated using DEditor, commissioning Any errors are unintentional.  LHalina Maidens MD [email or web address]  06/30/2022

## 2022-09-12 ENCOUNTER — Encounter: Payer: 59 | Admitting: Internal Medicine

## 2022-09-20 ENCOUNTER — Other Ambulatory Visit: Payer: Self-pay | Admitting: Internal Medicine

## 2022-09-20 DIAGNOSIS — I10 Essential (primary) hypertension: Secondary | ICD-10-CM

## 2022-09-21 ENCOUNTER — Ambulatory Visit: Payer: 59 | Admitting: Dermatology

## 2022-11-08 ENCOUNTER — Other Ambulatory Visit: Payer: Self-pay

## 2022-11-08 DIAGNOSIS — E039 Hypothyroidism, unspecified: Secondary | ICD-10-CM

## 2022-11-08 DIAGNOSIS — I10 Essential (primary) hypertension: Secondary | ICD-10-CM

## 2022-11-08 MED ORDER — ENALAPRIL MALEATE 10 MG PO TABS: 10.0000 mg | ORAL_TABLET | Freq: Every day | ORAL | 1 refills | Status: DC

## 2022-11-08 MED ORDER — LEVOTHYROXINE SODIUM 88 MCG PO TABS: 88.0000 ug | ORAL_TABLET | Freq: Every day | ORAL | 1 refills | Status: DC

## 2022-11-24 LAB — LIPID PANEL
Cholesterol: 218 — AB (ref 0–200)
HDL: 59 (ref 35–70)
LDL Cholesterol: 147
Triglycerides: 66 (ref 40–160)

## 2022-11-24 LAB — HEMOGLOBIN A1C: Hemoglobin A1C: 5.4

## 2022-11-24 LAB — BASIC METABOLIC PANEL: Glucose: 88

## 2022-11-30 ENCOUNTER — Encounter: Payer: 59 | Admitting: Internal Medicine

## 2022-12-13 ENCOUNTER — Encounter: Payer: Self-pay | Admitting: Internal Medicine

## 2022-12-13 ENCOUNTER — Ambulatory Visit (INDEPENDENT_AMBULATORY_CARE_PROVIDER_SITE_OTHER): Payer: 59 | Admitting: Internal Medicine

## 2022-12-13 VITALS — BP 122/78 | HR 86 | Ht 64.0 in | Wt 186.0 lb

## 2022-12-13 DIAGNOSIS — E785 Hyperlipidemia, unspecified: Secondary | ICD-10-CM | POA: Diagnosis not present

## 2022-12-13 DIAGNOSIS — E039 Hypothyroidism, unspecified: Secondary | ICD-10-CM | POA: Diagnosis not present

## 2022-12-13 DIAGNOSIS — Z0000 Encounter for general adult medical examination without abnormal findings: Secondary | ICD-10-CM | POA: Diagnosis not present

## 2022-12-13 DIAGNOSIS — Z1231 Encounter for screening mammogram for malignant neoplasm of breast: Secondary | ICD-10-CM | POA: Diagnosis not present

## 2022-12-13 DIAGNOSIS — I10 Essential (primary) hypertension: Secondary | ICD-10-CM | POA: Diagnosis not present

## 2022-12-13 LAB — POCT URINALYSIS DIPSTICK
Bilirubin, UA: NEGATIVE
Blood, UA: NEGATIVE
Glucose, UA: NEGATIVE
Ketones, UA: NEGATIVE
Leukocytes, UA: NEGATIVE
Nitrite, UA: NEGATIVE
Protein, UA: NEGATIVE
Spec Grav, UA: 1.01 (ref 1.010–1.025)
Urobilinogen, UA: 0.2 E.U./dL
pH, UA: 6 (ref 5.0–8.0)

## 2022-12-13 NOTE — Patient Instructions (Signed)
Call [facility] to schedule your mammogram at [phone].  

## 2022-12-13 NOTE — Assessment & Plan Note (Signed)
Lipids managed with diet changes 10 yr risk is low so no medications are needed at this time

## 2022-12-13 NOTE — Progress Notes (Signed)
Date:  12/13/2022   Name:  [name]   DOB:  [birth year]   MRN:  [id number]   Chief Complaint: Annual Exam [name] is a 62 y.o. female who presents today for her Complete Annual Exam. She feels well. She reports exercising walking 1-2 day a week. She reports she is sleeping poorly. Breast complaints none.  Mammogram: 04/2022 DEXA: none Pap smear: 07/2020 Colonoscopy: 10/2019 repeat 10 yrs  Health Maintenance Due  Topic Date Due   Zoster Vaccines- Shingrix (1 of 2) Never done   DTaP/Tdap/Td (3 - Td or Tdap) 02/04/2019    Immunization History  Administered Date(s) Administered   Influenza Inj Mdck Quad Pf 03/19/2017, 02/19/2018, 03/04/2019   Influenza, Seasonal, Injecte, Preservative Fre 02/03/2008   Influenza,inj,Quad PF,6+ Mos 03/20/2013, 06/30/2022   Influenza-Unspecified 04/18/2017, 02/02/2020, 03/21/2021   PFIZER(Purple Top)SARS-COV-2 Vaccination 08/18/2019, 09/17/2019   Tdap 02/03/2008, 02/03/2009    Hypertension This is a chronic problem. The problem is controlled. Pertinent negatives include no chest pain, headaches, palpitations or shortness of breath. Past treatments include ACE inhibitors. Identifiable causes of hypertension include a thyroid problem.  Thyroid Problem Presents for follow-up visit. Patient reports no anxiety, constipation, diarrhea, fatigue, palpitations or tremors. The symptoms have been stable.    Lab Results  Component Value Date   NA 138 09/07/2021   K 4.3 09/07/2021   CO2 24 09/07/2021   GLUCOSE 86 09/07/2021   BUN 16 09/07/2021   CREATININE 0.87 09/07/2021   CALCIUM 9.7 09/07/2021   EGFR 77 09/07/2021   GFRNONAA 74 03/03/2020   Lab Results  Component Value Date   CHOL 218 (A) 11/24/2022   HDL 59 11/24/2022   LDLCALC 147 11/24/2022   TRIG 66 11/24/2022   CHOLHDL 3.3 09/07/2021   Lab Results  Component Value Date   TSH 0.772 09/07/2021   Lab Results  Component Value Date   HGBA1C 5.4 11/24/2022   Lab Results   Component Value Date   WBC 3.5 09/07/2021   HGB 13.7 09/07/2021   HCT 40.4 09/07/2021   MCV 93 09/07/2021   PLT 239 09/07/2021   Lab Results  Component Value Date   ALT 18 09/07/2021   AST 20 09/07/2021   ALKPHOS 95 09/07/2021   BILITOT 0.4 09/07/2021   Lab Results  Component Value Date   VD25OH 58.3 06/10/2020     Review of Systems  Constitutional:  Negative for chills, fatigue and fever.  HENT:  Negative for congestion, hearing loss, tinnitus, trouble swallowing and voice change.   Eyes:  Negative for visual disturbance.  Respiratory:  Negative for cough, chest tightness, shortness of breath and wheezing.   Cardiovascular:  Negative for chest pain, palpitations and leg swelling.  Gastrointestinal:  Negative for abdominal pain, constipation, diarrhea and vomiting.  Endocrine: Negative for polydipsia and polyuria.  Genitourinary:  Negative for dysuria, frequency, genital sores, vaginal bleeding and vaginal discharge.  Musculoskeletal:  Negative for arthralgias, gait problem and joint swelling.  Skin:  Negative for color change and rash.  Neurological:  Negative for dizziness, tremors, light-headedness and headaches.  Hematological:  Negative for adenopathy. Does not bruise/bleed easily.  Psychiatric/Behavioral:  Negative for dysphoric mood and sleep disturbance. The patient is not nervous/anxious.     Patient Active Problem List   Diagnosis Date Noted   Mild hyperlipidemia 12/13/2022   Essential hypertension 07/22/2021   Acquired hypothyroidism 07/22/2021   Bilateral leg cramps 07/22/2021   Arthralgia of both ankles 08/13/2018   Restless leg  syndrome 06/13/2017   Iron deficiency 06/13/2017   Varicose veins of left lower extremity with inflammation 06/13/2017   Bilateral hand numbness 03/20/2013   Psoriasis 03/20/2013   Chronic low back pain 03/20/2013   Obesity (BMI 30-39.9) 07/22/2012    No Known Allergies  Past Surgical History:  Procedure Laterality Date    ABDOMINAL HYSTERECTOMY  2007   endometriosis, Dr. Greggory Keen   COLONOSCOPY WITH PROPOFOL N/A 10/31/2019   Procedure: COLONOSCOPY WITH PROPOFOL;  Surgeon: Midge Minium, MD;  Location: [facility];  Service: Endoscopy;  Laterality: N/A;  priority 4   VASCULAR SURGERY Left    left leg    Social History   Tobacco Use   Smoking status: Former    Current packs/day: 0.00    Types: Cigarettes    Quit date: 2008    Years since quitting: 16.5   Smokeless tobacco: Never  Vaping Use   Vaping status: Never Used  Substance Use Topics   Alcohol use: Yes    Comment: occasional   Drug use: No     Medication list has been reviewed and updated.  Current Meds  Medication Sig   enalapril (VASOTEC) 10 MG tablet Take 1 tablet (10 mg total) by mouth daily.   levothyroxine (SYNTHROID) 88 MCG tablet Take 1 tablet (88 mcg total) by mouth daily.   rOPINIRole (REQUIP) 0.5 MG tablet Take one tab po qhs for restless legs       12/13/2022    9:46 AM 06/30/2022    3:35 PM 09/07/2021    8:08 AM 07/22/2021    2:48 PM  GAD 7 : Generalized Anxiety Score  Nervous, Anxious, on Edge 0 0 0 0  Control/stop worrying 0 0 0 0  Worry too much - different things 0 0 0 0  Trouble relaxing 0 0 0 0  Restless 0 0 0 0  Easily annoyed or irritable 0 0 1 0  Afraid - awful might happen 0 0 0 0  Total GAD 7 Score 0 0 1 0  Anxiety Difficulty Not difficult at all Not difficult at all Not difficult at all        12/13/2022    9:46 AM 06/30/2022    3:34 PM 09/07/2021    8:08 AM  Depression screen PHQ 2/9  Decreased Interest 0 0 1  Down, Depressed, Hopeless 0 1 0  PHQ - 2 Score 0 1 1  Altered sleeping 3 3 0  Tired, decreased energy 1 2 1   Change in appetite 0 0 2  Feeling bad or failure about yourself  0 3 2  Trouble concentrating 0 2 0  Moving slowly or fidgety/restless 0 0 0  Suicidal thoughts 0 0 0  PHQ-9 Score 4 11 6   Difficult doing work/chores Not difficult at all Somewhat difficult Not difficult at  all    BP Readings from Last 3 Encounters:  12/13/22 122/78  06/30/22 128/82  09/07/21 132/78    Physical Exam Vitals and nursing note reviewed.  Constitutional:      General: She is not in acute distress.    Appearance: She is well-developed.  HENT:     Head: Normocephalic and atraumatic.     Right Ear: Tympanic membrane and ear canal normal.     Left Ear: Tympanic membrane and ear canal normal.     Nose:     Right Sinus: No maxillary sinus tenderness.     Left Sinus: No maxillary sinus tenderness.  Eyes:  General: No scleral icterus.       Right eye: No discharge.        Left eye: No discharge.     Conjunctiva/sclera: Conjunctivae normal.  Neck:     Thyroid: No thyromegaly.     Vascular: No carotid bruit.  Cardiovascular:     Rate and Rhythm: Normal rate and regular rhythm.     Pulses: Normal pulses.     Heart sounds: Normal heart sounds.  Pulmonary:     Effort: Pulmonary effort is normal. No respiratory distress.     Breath sounds: No wheezing.  Chest:  Breasts:    Right: No mass, nipple discharge, skin change or tenderness.     Left: No mass, nipple discharge, skin change or tenderness.  Abdominal:     General: Bowel sounds are normal.     Palpations: Abdomen is soft.     Tenderness: There is no abdominal tenderness.  Musculoskeletal:        General: Normal range of motion.     Cervical back: Normal range of motion. No erythema.     Right lower leg: No edema.     Left lower leg: No edema.  Lymphadenopathy:     Cervical: No cervical adenopathy.  Skin:    General: Skin is warm and dry.     Findings: No rash.  Neurological:     General: No focal deficit present.     Mental Status: She is alert and oriented to person, place, and time.     Cranial Nerves: No cranial nerve deficit.     Sensory: No sensory deficit.     Deep Tendon Reflexes: Reflexes are normal and symmetric.  Psychiatric:        Attention and Perception: Attention normal.        Mood and  Affect: Mood normal.        Behavior: Behavior normal.     Wt Readings from Last 3 Encounters:  12/13/22 186 lb (84.4 kg)  06/30/22 195 lb (88.5 kg)  09/07/21 186 lb (84.4 kg)    BP 122/78   Pulse 86   Ht 5\' 4"  (1.626 m)   Wt 186 lb (84.4 kg)   SpO2 98%   BMI 31.93 kg/m   Assessment and Plan:  Problem List Items Addressed This Visit       Unprioritized   Mild hyperlipidemia    Lipids managed with diet changes 10 yr risk is low so no medications are needed at this time      Essential hypertension (Chronic)    Normal exam with stable BP on enalapril. No concerns or side effects to current medication. No change in regimen; continue low sodium diet.       Relevant Orders   CBC with Differential/Platelet   Comprehensive metabolic panel   POCT urinalysis dipstick (Completed)   Acquired hypothyroidism (Chronic)    Supplemented Lab Results  Component Value Date   TSH 0.772 09/07/2021         Relevant Orders   TSH + free T4   Other Visit Diagnoses     Annual physical exam    -  Primary   continue healthy diet; increase exercise to 150 min per week   Relevant Orders   CBC with Differential/Platelet   Comprehensive metabolic panel   TSH + free T4   Encounter for screening mammogram for breast cancer       schedule mammo in December   Relevant Orders   MM 3D  SCREENING MAMMOGRAM BILATERAL BREAST       Return in about 6 months (around 06/15/2023) for HTN.    Reubin Milan, MD [facility] [place]

## 2022-12-13 NOTE — Assessment & Plan Note (Signed)
Supplemented Lab Results  Component Value Date   TSH 0.772 09/07/2021

## 2022-12-13 NOTE — Assessment & Plan Note (Signed)
Normal exam with stable BP on enalapril. No concerns or side effects to current medication. No change in regimen; continue low sodium diet.

## 2023-04-19 ENCOUNTER — Encounter: Payer: 59 | Admitting: Internal Medicine

## 2023-06-14 ENCOUNTER — Ambulatory Visit
Admission: RE | Admit: 2023-06-14 | Discharge: 2023-06-14 | Disposition: A | Discharge status: Home / Self Care | Payer: Managed Care, Other (non HMO) | Source: Ambulatory Visit | Attending: Internal Medicine | Admitting: Internal Medicine

## 2023-06-14 DIAGNOSIS — Z1231 Encounter for screening mammogram for malignant neoplasm of breast: Secondary | ICD-10-CM | POA: Insufficient documentation

## 2023-06-20 ENCOUNTER — Encounter: Payer: Self-pay | Admitting: Internal Medicine

## 2023-06-20 ENCOUNTER — Ambulatory Visit: Payer: Managed Care, Other (non HMO) | Admitting: Internal Medicine

## 2023-06-20 VITALS — BP 104/68 | HR 76 | Ht 64.0 in | Wt 185.6 lb

## 2023-06-20 DIAGNOSIS — G2581 Restless legs syndrome: Secondary | ICD-10-CM

## 2023-06-20 DIAGNOSIS — I10 Essential (primary) hypertension: Secondary | ICD-10-CM | POA: Diagnosis not present

## 2023-06-20 DIAGNOSIS — R252 Cramp and spasm: Secondary | ICD-10-CM

## 2023-06-20 DIAGNOSIS — Z6831 Body mass index (BMI) 31.0-31.9, adult: Secondary | ICD-10-CM

## 2023-06-20 DIAGNOSIS — E039 Hypothyroidism, unspecified: Secondary | ICD-10-CM | POA: Diagnosis not present

## 2023-06-20 MED ORDER — LEVOTHYROXINE SODIUM 88 MCG PO TABS
88.0000 ug | ORAL_TABLET | Freq: Every day | ORAL | 1 refills | Status: DC
Start: 2023-06-20 — End: 2024-01-07

## 2023-06-20 MED ORDER — ROPINIROLE HCL 0.5 MG PO TABS
ORAL_TABLET | ORAL | 1 refills | Status: DC
Start: 2023-06-20 — End: 2024-01-07

## 2023-06-20 MED ORDER — ENALAPRIL MALEATE 10 MG PO TABS
10.0000 mg | ORAL_TABLET | Freq: Every day | ORAL | 1 refills | Status: DC
Start: 2023-06-20 — End: 2024-01-07

## 2023-06-20 NOTE — Assessment & Plan Note (Signed)
 Using Requip  PRN

## 2023-06-20 NOTE — Assessment & Plan Note (Signed)
 Supplemented

## 2023-06-20 NOTE — Assessment & Plan Note (Signed)
 Controlled BP with normal exam. Current regimen is enalapril. Will continue same medications; encourage continued reduced sodium diet.

## 2023-06-20 NOTE — Progress Notes (Signed)
 Date:  06/20/2023   Name:  [name]   DOB:  08-16-1960   MRN:  [id number]   Chief Complaint: Hypertension and Hypothyroidism  Hypertension This is a chronic problem. The problem is controlled. Pertinent negatives include no chest pain, headaches, palpitations or shortness of breath. Past treatments include ACE inhibitors. Identifiable causes of hypertension include a thyroid  problem.  Thyroid  Problem Presents for follow-up visit. Patient reports no constipation, diarrhea, fatigue or palpitations. The symptoms have been stable.  Weight - she has been on Saxenda through weight loss clinic with no benefit.  She is hoping they will change her to Mounjaro /zepbound .  She did not tolerate semaglutide.  Review of Systems  Constitutional:  Negative for fatigue and unexpected weight change.  HENT:  Negative for nosebleeds.   Eyes:  Negative for visual disturbance.  Respiratory:  Negative for cough, chest tightness, shortness of breath and wheezing.   Cardiovascular:  Negative for chest pain, palpitations and leg swelling.  Gastrointestinal:  Negative for abdominal pain, constipation and diarrhea.  Neurological:  Negative for dizziness, weakness, light-headedness and headaches.     Lab Results  Component Value Date   NA 141 12/13/2022   K 4.4 12/13/2022   CO2 23 12/13/2022   GLUCOSE 86 12/13/2022   BUN 12 12/13/2022   CREATININE 0.87 12/13/2022   CALCIUM 10.0 12/13/2022   EGFR 77 12/13/2022   GFRNONAA 74 03/03/2020   Lab Results  Component Value Date   CHOL 218 (A) 11/24/2022   HDL 59 11/24/2022   LDLCALC 147 11/24/2022   TRIG 66 11/24/2022   CHOLHDL 3.3 09/07/2021   Lab Results  Component Value Date   TSH 0.739 12/13/2022   Lab Results  Component Value Date   HGBA1C 5.4 11/24/2022   Lab Results  Component Value Date   WBC 4.2 12/13/2022   HGB 13.6 12/13/2022   HCT 40.6 12/13/2022   MCV 93 12/13/2022   PLT 277 12/13/2022   Lab Results  Component Value Date    ALT 15 12/13/2022   AST 18 12/13/2022   ALKPHOS 107 12/13/2022   BILITOT 0.4 12/13/2022   Lab Results  Component Value Date   VD25OH 58.3 06/10/2020     Patient Active Problem List   Diagnosis Date Noted   Mild hyperlipidemia 12/13/2022   Essential hypertension 07/22/2021   Acquired hypothyroidism 07/22/2021   Bilateral leg cramps 07/22/2021   Arthralgia of both ankles 08/13/2018   Restless leg syndrome 06/13/2017   Iron deficiency 06/13/2017   Varicose veins of left lower extremity with inflammation 06/13/2017   Bilateral hand numbness 03/20/2013   Psoriasis 03/20/2013   Chronic low back pain 03/20/2013   BMI 31.0-31.9,adult 07/22/2012    No Known Allergies  Past Surgical History:  Procedure Laterality Date   ABDOMINAL HYSTERECTOMY  2007   endometriosis, Dr. Kathe   COLONOSCOPY WITH PROPOFOL  N/A 10/31/2019   Procedure: COLONOSCOPY WITH PROPOFOL ;  Surgeon: Jinny Carmine, MD;  Location: [facility];  Service: Endoscopy;  Laterality: N/A;  priority 4   VASCULAR SURGERY Left    left leg    Social History   Tobacco Use   Smoking status: Former    Current packs/day: 0.00    Types: Cigarettes    Quit date: 2008    Years since quitting: 17.1   Smokeless tobacco: Never  Vaping Use   Vaping status: Never Used  Substance Use Topics   Alcohol use: Yes    Comment: occasional   Drug  use: No     Medication list has been reviewed and updated.  Current Meds  Medication Sig   [DISCONTINUED] enalapril  (VASOTEC ) 10 MG tablet Take 1 tablet (10 mg total) by mouth daily.   [DISCONTINUED] levothyroxine  (SYNTHROID ) 88 MCG tablet Take 1 tablet (88 mcg total) by mouth daily.   [DISCONTINUED] rOPINIRole  (REQUIP ) 0.5 MG tablet Take one tab po qhs for restless legs       06/20/2023   11:06 AM 12/13/2022    9:46 AM 06/30/2022    3:35 PM 09/07/2021    8:08 AM  GAD 7 : Generalized Anxiety Score  Nervous, Anxious, on Edge 0 0 0 0  Control/stop worrying 0 0 0 0   Worry too much - different things 0 0 0 0  Trouble relaxing 0 0 0 0  Restless 0 0 0 0  Easily annoyed or irritable 0 0 0 1  Afraid - awful might happen 0 0 0 0  Total GAD 7 Score 0 0 0 1  Anxiety Difficulty Not difficult at all Not difficult at all Not difficult at all Not difficult at all       06/20/2023   11:06 AM 12/13/2022    9:46 AM 06/30/2022    3:34 PM  Depression screen PHQ 2/9  Decreased Interest 0 0 0  Down, Depressed, Hopeless 0 0 1  PHQ - 2 Score 0 0 1  Altered sleeping 1 3 3   Tired, decreased energy 2 1 2   Change in appetite 0 0 0  Feeling bad or failure about yourself  0 0 3  Trouble concentrating 0 0 2  Moving slowly or fidgety/restless 0 0 0  Suicidal thoughts 0 0 0  PHQ-9 Score 3 4 11   Difficult doing work/chores Not difficult at all Not difficult at all Somewhat difficult    BP Readings from Last 3 Encounters:  06/20/23 104/68  12/13/22 122/78  06/30/22 128/82    Physical Exam Vitals and nursing note reviewed.  Constitutional:      General: She is not in acute distress.    Appearance: She is well-developed.  HENT:     Head: Normocephalic and atraumatic.  Cardiovascular:     Rate and Rhythm: Normal rate and regular rhythm.     Heart sounds: No murmur heard. Pulmonary:     Effort: Pulmonary effort is normal. No respiratory distress.     Breath sounds: No wheezing or rhonchi.  Musculoskeletal:     Cervical back: Normal range of motion.     Right lower leg: No edema.     Left lower leg: No edema.  Lymphadenopathy:     Cervical: No cervical adenopathy.  Skin:    General: Skin is warm and dry.     Findings: No rash.  Neurological:     General: No focal deficit present.     Mental Status: She is alert and oriented to person, place, and time.  Psychiatric:        Mood and Affect: Mood normal.        Behavior: Behavior normal.     Wt Readings from Last 3 Encounters:  06/20/23 185 lb 9.6 oz (84.2 kg)  12/13/22 186 lb (84.4 kg)  06/30/22 195  lb (88.5 kg)    BP 104/68   Pulse 76   Ht 5' 4 (1.626 m)   Wt 185 lb 9.6 oz (84.2 kg)   SpO2 97%   BMI 31.86 kg/m   Assessment and Plan:  Problem List  Items Addressed This Visit       Unprioritized   BMI 31.0-31.9,adult   Currently on Saxenda through a weight loss clinic but no effect. Wegovy caused severe nausea and abdominal pain. She wants to try Zepbound  and will check with the clinic to see if they provide it. I would be willing to treat her if needed.      Restless leg syndrome (Chronic)   Using Requip  PRN      Essential hypertension - Primary (Chronic)   Controlled BP with normal exam. Current regimen is enalapril . Will continue same medications; encourage continued reduced sodium diet.       Relevant Medications   enalapril  (VASOTEC ) 10 MG tablet   Acquired hypothyroidism (Chronic)   Supplemented.      Relevant Medications   levothyroxine  (SYNTHROID ) 88 MCG tablet   Bilateral leg cramps   Relevant Medications   rOPINIRole  (REQUIP ) 0.5 MG tablet    No follow-ups on file.    [name], MD [facility] [place]

## 2023-06-20 NOTE — Assessment & Plan Note (Addendum)
Currently on Saxenda through a weight loss clinic but no effect. Wegovy caused severe nausea and abdominal pain. She wants to try Zepbound and will check with the clinic to see if they provide it. I would be willing to treat her if needed.

## 2023-06-21 ENCOUNTER — Encounter: Payer: Self-pay | Admitting: Internal Medicine

## 2023-08-24 ENCOUNTER — Ambulatory Visit: Payer: Managed Care, Other (non HMO) | Admitting: Internal Medicine

## 2023-09-12 ENCOUNTER — Encounter: Payer: Self-pay | Admitting: Internal Medicine

## 2023-09-12 ENCOUNTER — Ambulatory Visit: Payer: Managed Care, Other (non HMO) | Admitting: Internal Medicine

## 2023-09-12 VITALS — BP 112/74 | HR 78 | Ht 64.0 in | Wt 179.4 lb

## 2023-09-12 DIAGNOSIS — Z6830 Body mass index (BMI) 30.0-30.9, adult: Secondary | ICD-10-CM | POA: Diagnosis not present

## 2023-09-12 DIAGNOSIS — I10 Essential (primary) hypertension: Secondary | ICD-10-CM | POA: Diagnosis not present

## 2023-09-12 DIAGNOSIS — Z6831 Body mass index (BMI) 31.0-31.9, adult: Secondary | ICD-10-CM | POA: Diagnosis not present

## 2023-09-12 MED ORDER — TIRZEPATIDE-WEIGHT MANAGEMENT 10 MG/0.5ML ~~LOC~~ SOLN: 10.0000 mg | SUBCUTANEOUS | 0 refills | Status: DC

## 2023-09-12 NOTE — Assessment & Plan Note (Signed)
 Some weight loss on Zepbound up to 7.5 mg. Need to increase to 10 mg for better effect and titrate up each month. Follow up in 4 mo at [facility] and as needed.

## 2023-09-12 NOTE — Progress Notes (Signed)
 Date:  09/12/2023   Name:  [name]   DOB:  09/21/1960   MRN:  [id number]   Chief Complaint: Medication Consultation (Zepbound) Weight management - pt is here for weight management follow up.  Started on Zepbound on 05/2023.  Doing well without side effects.  Starting weight 185 lbs.   Today's weight 179 lbs.  Current diet is low carb and current exercise routine is rare.  She has been on Zepbound through [employer] and it is already approved.  She is ready to to to the next dose of 10 mg.  Hypertension This is a chronic problem. The problem is controlled. Pertinent negatives include no chest pain or shortness of breath. Past treatments include ACE inhibitors. The current treatment provides significant improvement.    Review of Systems  Constitutional:  Positive for fatigue. Negative for chills and fever.  Respiratory:  Negative for chest tightness and shortness of breath.   Cardiovascular:  Negative for chest pain and leg swelling.  Gastrointestinal:  Negative for constipation and diarrhea.  Psychiatric/Behavioral:  Negative for dysphoric mood and sleep disturbance. The patient is not nervous/anxious.      Lab Results  Component Value Date   NA 141 12/13/2022   K 4.4 12/13/2022   CO2 23 12/13/2022   GLUCOSE 86 12/13/2022   BUN 12 12/13/2022   CREATININE 0.87 12/13/2022   CALCIUM 10.0 12/13/2022   EGFR 77 12/13/2022   GFRNONAA 74 03/03/2020   Lab Results  Component Value Date   CHOL 218 (A) 11/24/2022   HDL 59 11/24/2022   LDLCALC 147 11/24/2022   TRIG 66 11/24/2022   CHOLHDL 3.3 09/07/2021   Lab Results  Component Value Date   TSH 0.739 12/13/2022   Lab Results  Component Value Date   HGBA1C 5.4 11/24/2022   Lab Results  Component Value Date   WBC 4.2 12/13/2022   HGB 13.6 12/13/2022   HCT 40.6 12/13/2022   MCV 93 12/13/2022   PLT 277 12/13/2022   Lab Results  Component Value Date   ALT 15 12/13/2022   AST 18 12/13/2022   ALKPHOS 107 12/13/2022    BILITOT 0.4 12/13/2022   Lab Results  Component Value Date   VD25OH 58.3 06/10/2020     Patient Active Problem List   Diagnosis Date Noted   Mild hyperlipidemia 12/13/2022   Essential hypertension 07/22/2021   Acquired hypothyroidism 07/22/2021   Bilateral leg cramps 07/22/2021   Arthralgia of both ankles 08/13/2018   Restless leg syndrome 06/13/2017   Iron deficiency 06/13/2017   Varicose veins of left lower extremity with inflammation 06/13/2017   Bilateral hand numbness 03/20/2013   Psoriasis 03/20/2013   Chronic low back pain 03/20/2013   BMI 31.0-31.9,adult 07/22/2012    No Known Allergies  Past Surgical History:  Procedure Laterality Date   ABDOMINAL HYSTERECTOMY  2007   endometriosis, Dr. Lucille Saas   COLONOSCOPY WITH PROPOFOL  N/A 10/31/2019   Procedure: COLONOSCOPY WITH PROPOFOL ;  Surgeon: Marnee Sink, MD;  Location: [facility];  Service: Endoscopy;  Laterality: N/A;  priority 4   VASCULAR SURGERY Left    left leg    Social History   Tobacco Use   Smoking status: Former    Current packs/day: 0.00    Types: Cigarettes    Quit date: 2008    Years since quitting: 17.3   Smokeless tobacco: Never  Vaping Use   Vaping status: Never Used  Substance Use Topics   Alcohol use: Yes  Comment: occasional   Drug use: No     Medication list has been reviewed and updated.  Current Meds  Medication Sig   enalapril  (VASOTEC ) 10 MG tablet Take 1 tablet (10 mg total) by mouth daily.   levothyroxine  (SYNTHROID ) 88 MCG tablet Take 1 tablet (88 mcg total) by mouth daily.   meloxicam (MOBIC) 15 MG tablet Take 15 mg by mouth daily.   methylPREDNISolone (MEDROL DOSEPAK) 4 MG TBPK tablet Take 4 mg by mouth as directed.   rOPINIRole  (REQUIP ) 0.5 MG tablet Take one tab po qhs for restless legs   tirzepatide 10 MG/0.5ML injection vial Inject 10 mg into the skin once a week.   [DISCONTINUED] ZEPBOUND 7.5 MG/0.5ML Pen Inject 7.5 mg into the skin once a week.        09/12/2023    4:04 PM 06/20/2023   11:06 AM 12/13/2022    9:46 AM 06/30/2022    3:35 PM  GAD 7 : Generalized Anxiety Score  Nervous, Anxious, on Edge 0 0 0 0  Control/stop worrying 0 0 0 0  Worry too much - different things 0 0 0 0  Trouble relaxing 0 0 0 0  Restless 0 0 0 0  Easily annoyed or irritable 0 0 0 0  Afraid - awful might happen 0 0 0 0  Total GAD 7 Score 0 0 0 0  Anxiety Difficulty Not difficult at all Not difficult at all Not difficult at all Not difficult at all       09/12/2023    4:02 PM 06/20/2023   11:06 AM 12/13/2022    9:46 AM  Depression screen PHQ 2/9  Decreased Interest 1 0 0  Down, Depressed, Hopeless 0 0 0  PHQ - 2 Score 1 0 0  Altered sleeping 2 1 3   Tired, decreased energy 2 2 1   Change in appetite 0 0 0  Feeling bad or failure about yourself  0 0 0  Trouble concentrating 0 0 0  Moving slowly or fidgety/restless 0 0 0  Suicidal thoughts 0 0 0  PHQ-9 Score 5 3 4   Difficult doing work/chores Not difficult at all Not difficult at all Not difficult at all    BP Readings from Last 3 Encounters:  09/12/23 112/74  06/20/23 104/68  12/13/22 122/78    Physical Exam Vitals and nursing note reviewed.  Constitutional:      General: She is not in acute distress.    Appearance: Normal appearance. She is well-developed.  HENT:     Head: Normocephalic and atraumatic.  Cardiovascular:     Rate and Rhythm: Normal rate and regular rhythm.  Pulmonary:     Effort: Pulmonary effort is normal. No respiratory distress.     Breath sounds: No wheezing or rhonchi.  Musculoskeletal:     Cervical back: Normal range of motion.     Right lower leg: No edema.     Left lower leg: No edema.  Skin:    General: Skin is warm and dry.     Findings: No rash.  Neurological:     Mental Status: She is alert and oriented to person, place, and time.  Psychiatric:        Mood and Affect: Mood normal.        Behavior: Behavior normal.     Wt Readings from Last 3  Encounters:  09/12/23 179 lb 6 oz (81.4 kg)  06/20/23 185 lb 9.6 oz (84.2 kg)  12/13/22 186 lb (84.4 kg)  BP 112/74   Pulse 78   Ht 5\' 4"  (1.626 m)   Wt 179 lb 6 oz (81.4 kg)   SpO2 98%   BMI 30.79 kg/m   Assessment and Plan:  Problem List Items Addressed This Visit       Unprioritized   BMI 31.0-31.9,adult (Chronic)   Some weight loss on Zepbound up to 7.5 mg. Need to increase to 10 mg for better effect and titrate up each month. Follow up in 4 mo at [facility] and as needed.      Essential hypertension - Primary (Chronic)   Blood pressure is well controlled.  Current medication is lisinopril. Will continue same regimen along with efforts to limit dietary sodium.       Other Visit Diagnoses       BMI 30.0-30.9,adult       Relevant Medications   tirzepatide 10 MG/0.5ML injection vial       No follow-ups on file.    Sheron Dixons, MD [facility] [place]

## 2023-09-12 NOTE — Assessment & Plan Note (Signed)
 Blood pressure is well controlled.  Current medication is lisinopril. Will continue same regimen along with efforts to limit dietary sodium.

## 2023-09-13 ENCOUNTER — Encounter: Payer: Self-pay | Admitting: Internal Medicine

## 2023-09-13 ENCOUNTER — Other Ambulatory Visit: Payer: Self-pay | Admitting: Internal Medicine

## 2023-09-13 NOTE — Telephone Encounter (Signed)
 Please review.  KP

## 2023-09-14 ENCOUNTER — Telehealth: Payer: Self-pay | Admitting: Pharmacy Technician

## 2023-09-14 ENCOUNTER — Other Ambulatory Visit (HOSPITAL_COMMUNITY): Payer: Self-pay

## 2023-09-14 NOTE — Telephone Encounter (Signed)
 Please review

## 2023-09-14 NOTE — Telephone Encounter (Signed)
 Pharmacy Patient Advocate Encounter   Received notification from Onbase that prior authorization for MOUNJARO 10 MG is required/requested.   Insurance verification completed.   The patient is insured through [facility] .   Per test claim:  ZEPBOUND 10 MG is preferred by the insurance.  If suggested medication is appropriate, Please send in a new RX and discontinue this one. If not, please advise as to why it's not appropriate so that we may request a Prior Authorization. Please note, some preferred medications may still require a PA.  If the suggested medications have not been trialed and there are no contraindications to their use, the PA will not be submitted, as it will not be approved.    $24.99 COPAY

## 2023-09-14 NOTE — Telephone Encounter (Signed)
 Did she tolerate Zepbound, is so that is what is covered and preferred and her copay will be $24.99?

## 2023-09-16 ENCOUNTER — Other Ambulatory Visit: Payer: Self-pay | Admitting: Internal Medicine

## 2023-09-16 DIAGNOSIS — Z6830 Body mass index (BMI) 30.0-30.9, adult: Secondary | ICD-10-CM

## 2023-09-16 MED ORDER — TIRZEPATIDE-WEIGHT MANAGEMENT 10 MG/0.5ML ~~LOC~~ SOLN: 10.0000 mg | SUBCUTANEOUS | 0 refills | Status: DC

## 2023-09-17 ENCOUNTER — Telehealth: Payer: Self-pay

## 2023-09-17 NOTE — Telephone Encounter (Signed)
 Disregard encounter. PA has been completed in 09/14/23.

## 2023-10-03 ENCOUNTER — Ambulatory Visit (INDEPENDENT_AMBULATORY_CARE_PROVIDER_SITE_OTHER)

## 2023-10-03 ENCOUNTER — Ambulatory Visit: Admitting: Podiatry

## 2023-10-03 ENCOUNTER — Encounter: Payer: Self-pay | Admitting: Podiatry

## 2023-10-03 DIAGNOSIS — M7751 Other enthesopathy of right foot: Secondary | ICD-10-CM

## 2023-10-03 DIAGNOSIS — M7741 Metatarsalgia, right foot: Secondary | ICD-10-CM

## 2023-10-03 NOTE — Progress Notes (Signed)
 Subjective:  Patient ID: [name], female    DOB: [birth year],  MRN: [id number] HPI Chief Complaint  Patient presents with   Foot Pain    5th MPJ/dorsal and plantar of lateral side right - burning, aching, sharp x 2-3 months, [facility] eval - xrays-no fracture, Rx'd meloxicam-no better, Rx'd prednisone-no better, tried different shoes and OTC inserts-no help   New Patient (Initial Visit)    63 y.o. female presents with the above complaint.   ROS: Denies fever chills nausea vomiting muscle aches pains calf pain back pain chest pain shortness of breath.  Past Medical History:  Diagnosis Date   Back pain    spinal stenosis   Basal cell carcinoma 03/09/2022   Left lateral lower leg. Treating with Imiquimod .   Dysplastic nevus 03/31/2009   Right upper arm. Moderate atypia   Family history of adverse reaction to anesthesia    Mother - PONV and woke during surgery (x1)   Hypertension    Hypothyroidism    Presence of dental prosthetic device    dental implant - top left   Psoriasis    Dr. Tresa Frohlich   Spinal stenosis    Thyroid  disease    Past Surgical History:  Procedure Laterality Date   ABDOMINAL HYSTERECTOMY  2007   endometriosis, Dr. Lucille Saas   COLONOSCOPY WITH PROPOFOL  N/A 10/31/2019   Procedure: COLONOSCOPY WITH PROPOFOL ;  Surgeon: Marnee Sink, MD;  Location: [facility];  Service: Endoscopy;  Laterality: N/A;  priority 4   VASCULAR SURGERY Left    left leg    Current Outpatient Medications:    enalapril  (VASOTEC ) 10 MG tablet, Take 1 tablet (10 mg total) by mouth daily., Disp: 90 tablet, Rfl: 1   levothyroxine  (SYNTHROID ) 88 MCG tablet, Take 1 tablet (88 mcg total) by mouth daily., Disp: 90 tablet, Rfl: 1   rOPINIRole  (REQUIP ) 0.5 MG tablet, Take one tab po qhs for restless legs, Disp: 90 tablet, Rfl: 1   tirzepatide 10 MG/0.5ML injection vial, Inject 10 mg into the skin once a week., Disp: 2 mL, Rfl: 0  No Known Allergies Review of Systems Objective:   There were no vitals filed for this visit.  General: Well developed, nourished, in no acute distress, alert and oriented x3   Dermatological: Skin is warm, dry and supple bilateral. Nails x 10 are well maintained; remaining integument appears unremarkable at this time. There are no open sores, no preulcerative lesions, no rash or signs of infection present.  Vascular: Dorsalis Pedis artery and Posterior Tibial artery pedal pulses are 2/4 bilateral with immedate capillary fill time. Pedal hair growth present. No varicosities and no lower extremity edema present bilateral.   Neruologic: Grossly intact via light touch bilateral. Vibratory intact via tuning fork bilateral. Protective threshold with Semmes Wienstein monofilament intact to all pedal sites bilateral. Patellar and Achilles deep tendon reflexes 2+ bilateral. No Babinski or clonus noted bilateral.   Musculoskeletal: No gross boney pedal deformities bilateral. No pain, crepitus, or limitation noted with foot and ankle range of motion bilateral. Muscular strength 5/5 in all groups tested bilateral.  Mild to moderate edema of the fourth intermetatarsal space with severe pain on palpation of the fifth metatarsal neck.  Also seems to have some sural nerve involvement.  Gait: Unassisted, Nonantalgic.    Radiographs:  Radiographs taken today do not demonstrate any significant osseous abnormalities I do not see a fracture at the neck today the neck does look a little but does not demonstrate any bone callus.  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  Plan: She states that she had seen [facility] mid April and they referred her to us .  They did provide her with a Darco shoe which has been painful for her as well as a steroid pack and meloxicam.  She states that none of the anti-inflammatories are working to make her any better.  She states that she never noticed a difference she  stated everything is feeling worse at this point.     Maritta Kief T. [place], [place]

## 2023-10-11 ENCOUNTER — Other Ambulatory Visit: Payer: Self-pay | Admitting: Internal Medicine

## 2023-10-11 ENCOUNTER — Encounter: Payer: Self-pay | Admitting: Internal Medicine

## 2023-10-11 DIAGNOSIS — Z6830 Body mass index (BMI) 30.0-30.9, adult: Secondary | ICD-10-CM

## 2023-10-11 MED ORDER — TIRZEPATIDE-WEIGHT MANAGEMENT 10 MG/0.5ML ~~LOC~~ SOLN: 10.0000 mg | SUBCUTANEOUS | 0 refills | Status: DC

## 2023-10-11 NOTE — Telephone Encounter (Signed)
 Please review.  KP

## 2023-10-15 ENCOUNTER — Ambulatory Visit: Admitting: Podiatry

## 2023-10-19 DIAGNOSIS — S92353A Displaced fracture of fifth metatarsal bone, unspecified foot, initial encounter for closed fracture: Secondary | ICD-10-CM | POA: Insufficient documentation

## 2023-10-31 ENCOUNTER — Telehealth: Payer: Self-pay

## 2023-10-31 ENCOUNTER — Ambulatory Visit: Admitting: Podiatry

## 2023-10-31 ENCOUNTER — Encounter: Payer: Self-pay | Admitting: Podiatry

## 2023-10-31 DIAGNOSIS — S92354A Nondisplaced fracture of fifth metatarsal bone, right foot, initial encounter for closed fracture: Secondary | ICD-10-CM | POA: Diagnosis not present

## 2023-10-31 NOTE — Progress Notes (Signed)
 She presents today for of her right foot.  States that she reinjured it this past weekend in the 10/2023.  She states that she came down hard on the lateral aspect of her right foot.  She was seen by [facility] who x-rayed her and stated that she had a fracture of the fifth metatarsal.  Placed her in a boot.  She has been walking and icing and elevating it.  Objective: Vital signs are stable she is alert oriented x 3.  There is minimal edema no erythema cellulitis drainage or odor though she does have pain on palpation of the fifth metatarsal base of that right foot.  Radiographs reviewed today do demonstrate osseously mature individual with good bone mineralization she does have a Jones fracture incomplete fifth met right.  Nondisplaced none comminuted.  Assessment: Pain in limb secondary to fifth metatarsal fracture right foot.  Plan: Recommended that she continue to wear the boot at all times and begin nonweightbearing status utilizing crutches or knee scooter.  She states that she will order when she gets home.  I will follow-up with her in 3 to 4 weeks for another set of x-rays

## 2023-10-31 NOTE — Telephone Encounter (Signed)
 Patient called, asking for temporary Handicapped placard - OK with Dr. Lara Plants.  Advised patient to come by the front desk to pick up the form.

## 2023-11-19 ENCOUNTER — Other Ambulatory Visit: Payer: Self-pay | Admitting: Internal Medicine

## 2023-11-19 ENCOUNTER — Encounter: Payer: Self-pay | Admitting: Internal Medicine

## 2023-11-19 DIAGNOSIS — Z6830 Body mass index (BMI) 30.0-30.9, adult: Secondary | ICD-10-CM

## 2023-11-19 MED ORDER — TIRZEPATIDE-WEIGHT MANAGEMENT 10 MG/0.5ML ~~LOC~~ SOLN: 10.0000 mg | SUBCUTANEOUS | 0 refills | Status: DC

## 2023-11-19 NOTE — Progress Notes (Unsigned)
 Date:  11/19/2023   Name:  [name]   DOB:  Oct 10, 1960   MRN:  [id number]   Chief Complaint: No chief complaint on file.  HPI  Review of Systems   Lab Results  Component Value Date   NA 141 12/13/2022   K 4.4 12/13/2022   CO2 23 12/13/2022   GLUCOSE 86 12/13/2022   BUN 12 12/13/2022   CREATININE 0.87 12/13/2022   CALCIUM 10.0 12/13/2022   EGFR 77 12/13/2022   GFRNONAA 74 03/03/2020   Lab Results  Component Value Date   CHOL 218 (A) 11/24/2022   HDL 59 11/24/2022   LDLCALC 147 11/24/2022   TRIG 66 11/24/2022   CHOLHDL 3.3 09/07/2021   Lab Results  Component Value Date   TSH 0.739 12/13/2022   Lab Results  Component Value Date   HGBA1C 5.4 11/24/2022   Lab Results  Component Value Date   WBC 4.2 12/13/2022   HGB 13.6 12/13/2022   HCT 40.6 12/13/2022   MCV 93 12/13/2022   PLT 277 12/13/2022   Lab Results  Component Value Date   ALT 15 12/13/2022   AST 18 12/13/2022   ALKPHOS 107 12/13/2022   BILITOT 0.4 12/13/2022   Lab Results  Component Value Date   VD25OH 58.3 06/10/2020     Patient Active Problem List   Diagnosis Date Noted   Closed fracture of fifth metatarsal bone 10/19/2023   Mild hyperlipidemia 12/13/2022   Essential hypertension 07/22/2021   Acquired hypothyroidism 07/22/2021   Bilateral leg cramps 07/22/2021   Arthralgia of both ankles 08/13/2018   Restless leg syndrome 06/13/2017   Iron deficiency 06/13/2017   Varicose veins of left lower extremity with inflammation 06/13/2017   Bilateral hand numbness 03/20/2013   Psoriasis 03/20/2013   Chronic low back pain 03/20/2013   BMI 31.0-31.9,adult 07/22/2012    No Known Allergies  Past Surgical History:  Procedure Laterality Date   ABDOMINAL HYSTERECTOMY  2007   endometriosis, Dr. Kathe   COLONOSCOPY WITH PROPOFOL  N/A 10/31/2019   Procedure: COLONOSCOPY WITH PROPOFOL ;  Surgeon: Stacey Carmine, MD;  Location: [facility];  Service: Endoscopy;  Laterality: N/A;   priority 4   VASCULAR SURGERY Left    left leg    Social History   Tobacco Use   Smoking status: Former    Current packs/day: 0.00    Types: Cigarettes    Quit date: 2008    Years since quitting: 17.5   Smokeless tobacco: Never  Vaping Use   Vaping status: Never Used  Substance Use Topics   Alcohol use: Yes    Comment: occasional   Drug use: No     Medication list has been reviewed and updated.  No outpatient medications have been marked as taking for the 11/19/23 encounter (Orders Only) with Stacey Stacey DEL, MD.       09/12/2023    4:04 PM 06/20/2023   11:06 AM 12/13/2022    9:46 AM 06/30/2022    3:35 PM  GAD 7 : Generalized Anxiety Score  Nervous, Anxious, on Edge 0 0 0 0  Control/stop worrying 0 0 0 0  Worry too much - different things 0 0 0 0  Trouble relaxing 0 0 0 0  Restless 0 0 0 0  Easily annoyed or irritable 0 0 0 0  Afraid - awful might happen 0 0 0 0  Total GAD 7 Score 0 0 0 0  Anxiety Difficulty Not difficult at all Not  difficult at all Not difficult at all Not difficult at all       09/12/2023    4:02 PM 06/20/2023   11:06 AM 12/13/2022    9:46 AM  Depression screen PHQ 2/9  Decreased Interest 1 0 0  Down, Depressed, Hopeless 0 0 0  PHQ - 2 Score 1 0 0  Altered sleeping 2 1 3   Tired, decreased energy 2 2 1   Change in appetite 0 0 0  Feeling bad or failure about yourself  0 0 0  Trouble concentrating 0 0 0  Moving slowly or fidgety/restless 0 0 0  Suicidal thoughts 0 0 0  PHQ-9 Score 5 3 4   Difficult doing work/chores Not difficult at all Not difficult at all Not difficult at all    BP Readings from Last 3 Encounters:  09/12/23 112/74  06/20/23 104/68  12/13/22 122/78    Physical Exam  Wt Readings from Last 3 Encounters:  09/12/23 179 lb 6 oz (81.4 kg)  06/20/23 185 lb 9.6 oz (84.2 kg)  12/13/22 186 lb (84.4 kg)    There were no vitals taken for this visit.  Assessment and Plan:  Problem List Items Addressed This Visit    None   No follow-ups on file.    Stacey HILARIO Adie, MD [facility] [place]

## 2023-11-28 ENCOUNTER — Ambulatory Visit (INDEPENDENT_AMBULATORY_CARE_PROVIDER_SITE_OTHER)

## 2023-11-28 ENCOUNTER — Ambulatory Visit: Admitting: Podiatry

## 2023-11-28 DIAGNOSIS — S92354A Nondisplaced fracture of fifth metatarsal bone, right foot, initial encounter for closed fracture: Secondary | ICD-10-CM

## 2023-11-28 DIAGNOSIS — S92354D Nondisplaced fracture of fifth metatarsal bone, right foot, subsequent encounter for fracture with routine healing: Secondary | ICD-10-CM

## 2023-11-28 NOTE — Progress Notes (Signed)
 She presents today for follow-up of her Jones fracture fifth metatarsal base of the right foot.  She states that is feeling better and she continues to stay on her knee scooter and has not been weightbearing at all.  Objective: Vital signs are stable oriented x 3.  Pulses are palpable.  She has pain with palpation fifth metatarsal base.  Radiographs taken today demonstrate obvious fracture mildly displaced none comminuted Jones fracture fifth met right foot.  Assessment: Jones fracture fifth met base right  Plan: Continue nonweightbearing Weeks use a knee scooter.  Follow-up with her for x-rays at that time

## 2023-11-29 NOTE — Addendum Note (Signed)
 Addended by: ELAYNE KNEE E on: 11/29/2023 07:40 PM   Modules accepted: Level of Service

## 2023-12-14 ENCOUNTER — Encounter: Payer: Self-pay | Admitting: Internal Medicine

## 2023-12-24 NOTE — Progress Notes (Signed)
 See note in [facility].

## 2024-01-02 ENCOUNTER — Ambulatory Visit (INDEPENDENT_AMBULATORY_CARE_PROVIDER_SITE_OTHER)

## 2024-01-02 ENCOUNTER — Ambulatory Visit: Admitting: Podiatry

## 2024-01-02 ENCOUNTER — Ambulatory Visit (INDEPENDENT_AMBULATORY_CARE_PROVIDER_SITE_OTHER): Admitting: Podiatry

## 2024-01-02 DIAGNOSIS — S92354D Nondisplaced fracture of fifth metatarsal bone, right foot, subsequent encounter for fracture with routine healing: Secondary | ICD-10-CM

## 2024-01-02 NOTE — Progress Notes (Signed)
 She presents today for follow-up of her closed nondisplaced fracture of her fifth metatarsal which really appears to be a Jones fracture of the right foot.  She is ready for some new x-rays today and good news.  Objective: She presents today utilizing her [place] blue knee scooter with her cam boot.  Was removed demonstrates no erythema edema cellulitis drainage or odor.  Radiographs taken today demonstrate approximately 50% of the Jones fracture has healed from top to bottom.  She does have moderate osteopenia.  Assessment: Osteopenia slowly healing fracture Jones fracture fifth metatarsal right foot.  Plan: I will allow her to start driving with a tennis shoe however she still needs to be nonweightbearing with her cam boot otherwise.  This needs to occur for at least 4 more weeks another set of x-rays will be taken she is on a follow-up with her primary care and request a DEXA scan and make sure that her thyroid  parathyroid vitamin D  and calcium are all in check.

## 2024-01-07 ENCOUNTER — Encounter: Payer: Self-pay | Admitting: Internal Medicine

## 2024-01-07 ENCOUNTER — Ambulatory Visit (INDEPENDENT_AMBULATORY_CARE_PROVIDER_SITE_OTHER): Admitting: Internal Medicine

## 2024-01-07 ENCOUNTER — Other Ambulatory Visit: Payer: Self-pay

## 2024-01-07 VITALS — BP 122/70 | HR 78 | Ht 64.0 in | Wt 168.0 lb

## 2024-01-07 DIAGNOSIS — G2581 Restless legs syndrome: Secondary | ICD-10-CM

## 2024-01-07 DIAGNOSIS — Z1231 Encounter for screening mammogram for malignant neoplasm of breast: Secondary | ICD-10-CM

## 2024-01-07 DIAGNOSIS — E2839 Other primary ovarian failure: Secondary | ICD-10-CM | POA: Diagnosis not present

## 2024-01-07 DIAGNOSIS — E559 Vitamin D deficiency, unspecified: Secondary | ICD-10-CM | POA: Insufficient documentation

## 2024-01-07 DIAGNOSIS — Z6831 Body mass index (BMI) 31.0-31.9, adult: Secondary | ICD-10-CM

## 2024-01-07 DIAGNOSIS — E785 Hyperlipidemia, unspecified: Secondary | ICD-10-CM | POA: Diagnosis not present

## 2024-01-07 DIAGNOSIS — Z13820 Encounter for screening for osteoporosis: Secondary | ICD-10-CM

## 2024-01-07 DIAGNOSIS — E039 Hypothyroidism, unspecified: Secondary | ICD-10-CM

## 2024-01-07 DIAGNOSIS — Z0000 Encounter for general adult medical examination without abnormal findings: Secondary | ICD-10-CM

## 2024-01-07 DIAGNOSIS — I10 Essential (primary) hypertension: Secondary | ICD-10-CM | POA: Diagnosis not present

## 2024-01-07 MED ORDER — TIRZEPATIDE 7.5 MG/0.5ML ~~LOC~~ SOAJ: 7.5000 mg | SUBCUTANEOUS | 0 refills | Status: DC

## 2024-01-07 MED ORDER — ROPINIROLE HCL 0.5 MG PO TABS: ORAL_TABLET | ORAL | 1 refills | Status: AC

## 2024-01-07 MED ORDER — LEVOTHYROXINE SODIUM 88 MCG PO TABS: 88.0000 ug | ORAL_TABLET | Freq: Every day | ORAL | 1 refills | Status: DC

## 2024-01-07 MED ORDER — TIRZEPATIDE-WEIGHT MANAGEMENT 7.5 MG/0.5ML ~~LOC~~ SOLN: 7.5000 mg | SUBCUTANEOUS | 0 refills | Status: DC

## 2024-01-07 MED ORDER — ENALAPRIL MALEATE 10 MG PO TABS: 10.0000 mg | ORAL_TABLET | Freq: Every day | ORAL | 1 refills | Status: DC

## 2024-01-07 NOTE — Assessment & Plan Note (Signed)
 Managed with diet and recently weight loss. Lab Results  Component Value Date   [facility] 147 11/24/2022

## 2024-01-07 NOTE — Patient Instructions (Signed)
 Call [facility] to schedule your mammogram and Bone Density at [phone].

## 2024-01-07 NOTE — Assessment & Plan Note (Signed)
 History of low Vitamin D  Now taking oral supplements but also had recent foot fracture Will get vitamin D  level and DEXA

## 2024-01-07 NOTE — Progress Notes (Signed)
 Date:  01/07/2024   Name:  [name]   DOB:  1960-11-07   MRN:  [id number]   Chief Complaint: Annual Exam [name] is a 63 y.o. female who presents today for her Complete Annual Exam. She feels well. She reports exercising - not at this time/ Rt Foot Boot due to Foot Fracture. She reports she is sleeping fairly well. Breast complaints - none.  Health Maintenance  Topic Date Due   Zoster (Shingles) Vaccine (1 of 2) Never done   Pneumococcal Vaccine for age over 46 (1 of 1 - PCV) Never done   DTaP/Tdap/Td vaccine (3 - Td or Tdap) 02/04/2019   COVID-19 Vaccine (3 - Pfizer risk series) 10/15/2019   Flu Shot  08/12/2024*   Mammogram  06/13/2024   Pap with HPV screening  07/22/2025   Colon Cancer Screening  10/30/2029   Hepatitis C Screening  Completed   HIV Screening  Completed   Hepatitis B Vaccine  Aged Out   HPV Vaccine  Aged Out   Meningitis B Vaccine  Aged Out  *Topic was postponed. The date shown is not the original due date.    Hypertension This is a chronic problem. The problem is controlled. Pertinent negatives include no chest pain, headaches, palpitations or shortness of breath. Past treatments include ACE inhibitors. Identifiable causes of hypertension include a thyroid  problem.  Thyroid  Problem Presents for follow-up visit. Patient reports no constipation, diarrhea, fatigue or palpitations. The symptoms have been stable.  RLS-  controlled on PRN Requip . Weight management - on Wegovy 10 mg weekly.  Starting weight 185 lbs.  Today 168 lbs.  However she has nausea and vomiting with minimal provocation and sometimes uncontrollable diarrhea.   Review of Systems  Constitutional:  Negative for fatigue and unexpected weight change.  HENT:  Negative for trouble swallowing.   Eyes:  Negative for visual disturbance.  Respiratory:  Negative for cough, chest tightness, shortness of breath and wheezing.   Cardiovascular:  Negative for chest pain, palpitations and  leg swelling.  Gastrointestinal:  Negative for abdominal pain, constipation and diarrhea.  Musculoskeletal:  Negative for arthralgias and myalgias.  Neurological:  Negative for dizziness, weakness, light-headedness and headaches.     Lab Results  Component Value Date   NA 141 12/13/2022   K 4.4 12/13/2022   CO2 23 12/13/2022   GLUCOSE 86 12/13/2022   BUN 12 12/13/2022   CREATININE 0.87 12/13/2022   CALCIUM 10.0 12/13/2022   EGFR 77 12/13/2022   GFRNONAA 74 03/03/2020   Lab Results  Component Value Date   CHOL 218 (A) 11/24/2022   HDL 59 11/24/2022   LDLCALC 147 11/24/2022   TRIG 66 11/24/2022   CHOLHDL 3.3 09/07/2021   Lab Results  Component Value Date   TSH 0.739 12/13/2022   Lab Results  Component Value Date   HGBA1C 5.4 11/24/2022   Lab Results  Component Value Date   WBC 4.2 12/13/2022   HGB 13.6 12/13/2022   HCT 40.6 12/13/2022   MCV 93 12/13/2022   PLT 277 12/13/2022   Lab Results  Component Value Date   ALT 15 12/13/2022   AST 18 12/13/2022   ALKPHOS 107 12/13/2022   BILITOT 0.4 12/13/2022   Lab Results  Component Value Date   VD25OH 58.3 06/10/2020     Patient Active Problem List   Diagnosis Date Noted   Vitamin D  deficiency 01/07/2024   Closed fracture of fifth metatarsal bone 10/19/2023   Mild  hyperlipidemia 12/13/2022   Essential hypertension 07/22/2021   Acquired hypothyroidism 07/22/2021   Arthralgia of both ankles 08/13/2018   Restless leg syndrome 06/13/2017   Iron deficiency 06/13/2017   Varicose veins of left lower extremity with inflammation 06/13/2017   Bilateral hand numbness 03/20/2013   Psoriasis 03/20/2013   Chronic low back pain 03/20/2013   BMI 31.0-31.9,adult 07/22/2012    No Known Allergies  Past Surgical History:  Procedure Laterality Date   ABDOMINAL HYSTERECTOMY  2007   endometriosis, Dr. Kathe   COLONOSCOPY WITH PROPOFOL  N/A 10/31/2019   Procedure: COLONOSCOPY WITH PROPOFOL ;  Surgeon: Jinny Carmine, MD;   Location: [facility];  Service: Endoscopy;  Laterality: N/A;  priority 4   VASCULAR SURGERY Left    left leg    Social History   Tobacco Use   Smoking status: Former    Current packs/day: 0.00    Types: Cigarettes    Quit date: 2008    Years since quitting: 17.6   Smokeless tobacco: Never  Vaping Use   Vaping status: Never Used  Substance Use Topics   Alcohol use: Yes    Comment: occasional   Drug use: No     Medication list has been reviewed and updated.  Current Meds  Medication Sig   calcium citrate (CALCITRATE - DOSED IN MG ELEMENTAL CALCIUM) 950 (200 Ca) MG tablet Take 200 mg of elemental calcium by mouth daily.   tirzepatide  7.5 MG/0.5ML injection vial Inject 7.5 mg into the skin once a week.   [DISCONTINUED] enalapril  (VASOTEC ) 10 MG tablet Take 1 tablet (10 mg total) by mouth daily.   [DISCONTINUED] levothyroxine  (SYNTHROID ) 88 MCG tablet Take 1 tablet (88 mcg total) by mouth daily.   [DISCONTINUED] rOPINIRole  (REQUIP ) 0.5 MG tablet Take one tab po qhs for restless legs   [DISCONTINUED] tirzepatide  10 MG/0.5ML injection vial Inject 10 mg into the skin once a week.       09/12/2023    4:04 PM 06/20/2023   11:06 AM 12/13/2022    9:46 AM 06/30/2022    3:35 PM  GAD 7 : Generalized Anxiety Score  Nervous, Anxious, on Edge 0 0 0 0  Control/stop worrying 0 0 0 0  Worry too much - different things 0 0 0 0  Trouble relaxing 0 0 0 0  Restless 0 0 0 0  Easily annoyed or irritable 0 0 0 0  Afraid - awful might happen 0 0 0 0  Total GAD 7 Score 0 0 0 0  Anxiety Difficulty Not difficult at all Not difficult at all Not difficult at all Not difficult at all       01/07/2024   10:50 AM 09/12/2023    4:02 PM 06/20/2023   11:06 AM  Depression screen PHQ 2/9  Decreased Interest 0 1 0  Down, Depressed, Hopeless 0 0 0  PHQ - 2 Score 0 1 0  Altered sleeping 1 2 1   Tired, decreased energy 1 2 2   Change in appetite 0 0 0  Feeling bad or failure about yourself  0 0 0   Trouble concentrating 0 0 0  Moving slowly or fidgety/restless 0 0 0  Suicidal thoughts 0 0 0  PHQ-9 Score 2 5 3   Difficult doing work/chores Not difficult at all Not difficult at all Not difficult at all    BP Readings from Last 3 Encounters:  01/07/24 122/70  09/12/23 112/74  06/20/23 104/68    Physical Exam Vitals and nursing note reviewed.  Constitutional:      General: She is not in acute distress.    Appearance: She is well-developed.  HENT:     Head: Normocephalic and atraumatic.     Right Ear: Tympanic membrane and ear canal normal.     Left Ear: Tympanic membrane and ear canal normal.     Nose:     Right Sinus: No maxillary sinus tenderness.     Left Sinus: No maxillary sinus tenderness.  Eyes:     General: No scleral icterus.       Right eye: No discharge.        Left eye: No discharge.     Conjunctiva/sclera: Conjunctivae normal.  Neck:     Thyroid : No thyromegaly.     Vascular: No carotid bruit.  Cardiovascular:     Rate and Rhythm: Normal rate and regular rhythm.     Pulses: Normal pulses.     Heart sounds: Normal heart sounds.  Pulmonary:     Effort: Pulmonary effort is normal. No respiratory distress.     Breath sounds: No wheezing.  Abdominal:     General: Bowel sounds are normal.     Palpations: Abdomen is soft.     Tenderness: There is no abdominal tenderness.  Musculoskeletal:     Cervical back: Normal range of motion. No erythema.     Left lower leg: No edema.     Comments: Surgical boot on right foot  Lymphadenopathy:     Cervical: No cervical adenopathy.  Skin:    General: Skin is warm and dry.     Findings: No rash.  Neurological:     Mental Status: She is alert and oriented to person, place, and time.     Cranial Nerves: No cranial nerve deficit.     Sensory: No sensory deficit.     Deep Tendon Reflexes: Reflexes are normal and symmetric.  Psychiatric:        Attention and Perception: Attention normal.        Mood and Affect: Mood  normal.     Wt Readings from Last 3 Encounters:  01/07/24 168 lb (76.2 kg)  09/12/23 179 lb 6 oz (81.4 kg)  06/20/23 185 lb 9.6 oz (84.2 kg)    BP 122/70   Pulse 78   Ht 5' 4 (1.626 m)   Wt 168 lb (76.2 kg)   SpO2 100%   BMI 28.84 kg/m   Assessment and Plan:  Problem List Items Addressed This Visit       Unprioritized   BMI 31.0-31.9,adult (Chronic)   Having persistent GI side effects to Zepbound  10 mg Will reduce the dose to 7.5 mg weekly and re-evaluate in 3 months.      Relevant Medications   tirzepatide  7.5 MG/0.5ML injection vial   Restless leg syndrome (Chronic)   Taking Requip  with good results as needed. Will check CBC and CMP      Relevant Medications   rOPINIRole  (REQUIP ) 0.5 MG tablet   Other Relevant Orders   CBC with Differential/Platelet   Essential hypertension (Chronic)   Blood pressure is well controlled on enalapril . No medication side effects noted. Plan to continue current medications.       Relevant Medications   enalapril  (VASOTEC ) 10 MG tablet   Other Relevant Orders   CBC with Differential/Platelet   Comprehensive metabolic panel with GFR   Urinalysis, Routine w reflex microscopic   Acquired hypothyroidism (Chronic)   Supplemented.      Relevant Medications  levothyroxine  (SYNTHROID ) 88 MCG tablet   Other Relevant Orders   TSH + free T4   Mild hyperlipidemia   Managed with diet and recently weight loss. Lab Results  Component Value Date   LDLCALC 147 11/24/2022         Relevant Medications   enalapril  (VASOTEC ) 10 MG tablet   Other Relevant Orders   Lipid panel   Vitamin D  deficiency   History of low Vitamin D  Now taking oral supplements but also had recent foot fracture Will get vitamin D  level and DEXA      Relevant Orders   VITAMIN D  25 Hydroxy (Vit-D Deficiency, Fractures)   Other Visit Diagnoses       Annual physical exam    -  Primary   Relevant Orders   CBC with Differential/Platelet    Comprehensive metabolic panel with GFR   Hemoglobin A1c   Lipid panel   Urinalysis, Routine w reflex microscopic   TSH + free T4   VITAMIN D  25 Hydroxy (Vit-D Deficiency, Fractures)     Encounter for screening mammogram for breast cancer       Relevant Orders   MM 3D SCREENING MAMMOGRAM BILATERAL BREAST     Ovarian failure       with recent fracture recommend DEXA scan   Relevant Orders   DG Bone Density     Encounter for screening for osteoporosis           Return in about 3 months (around 04/08/2024) for weight check, HTN.    Stacey HILARIO Adie, MD [facility] [place]

## 2024-01-07 NOTE — Assessment & Plan Note (Signed)
 Taking Requip  with good results as needed. Will check CBC and CMP

## 2024-01-07 NOTE — Assessment & Plan Note (Signed)
 Having persistent GI side effects to Zepbound  10 mg Will reduce the dose to 7.5 mg weekly and re-evaluate in 3 months.

## 2024-01-07 NOTE — Assessment & Plan Note (Signed)
 Supplemented

## 2024-01-07 NOTE — Assessment & Plan Note (Signed)
 Blood pressure is well controlled on enalapril . No medication side effects noted. Plan to continue current medications.

## 2024-01-08 ENCOUNTER — Ambulatory Visit: Payer: Self-pay | Admitting: Internal Medicine

## 2024-01-08 LAB — CBC WITH DIFFERENTIAL/PLATELET
Basophils Absolute: 0 x10E3/uL (ref 0.0–0.2)
Basos: 0 %
EOS (ABSOLUTE): 0.1 x10E3/uL (ref 0.0–0.4)
Eos: 2 %
Hematocrit: 40.5 % (ref 34.0–46.6)
Hemoglobin: 13.4 g/dL (ref 11.1–15.9)
Immature Grans (Abs): 0 x10E3/uL (ref 0.0–0.1)
Immature Granulocytes: 0 %
Lymphocytes Absolute: 1.5 x10E3/uL (ref 0.7–3.1)
Lymphs: 33 %
MCH: 32.1 pg (ref 26.6–33.0)
MCHC: 33.1 g/dL (ref 31.5–35.7)
MCV: 97 fL (ref 79–97)
Monocytes Absolute: 0.5 x10E3/uL (ref 0.1–0.9)
Monocytes: 10 %
Neutrophils Absolute: 2.5 x10E3/uL (ref 1.4–7.0)
Neutrophils: 55 %
Platelets: 279 x10E3/uL (ref 150–450)
RBC: 4.18 x10E6/uL (ref 3.77–5.28)
RDW: 12 % (ref 11.7–15.4)
WBC: 4.5 x10E3/uL (ref 3.4–10.8)

## 2024-01-08 LAB — COMPREHENSIVE METABOLIC PANEL WITH GFR
ALT: 12 IU/L (ref 0–32)
AST: 13 IU/L (ref 0–40)
Albumin: 4.6 g/dL (ref 3.8–4.9)
Alkaline Phosphatase: 87 IU/L (ref 44–121)
BUN/Creatinine Ratio: 17 (ref 12–28)
BUN: 17 mg/dL (ref 8–27)
Bilirubin Total: 0.5 mg/dL (ref 0.0–1.2)
CO2: 22 mmol/L (ref 20–29)
Calcium: 9.8 mg/dL (ref 8.7–10.3)
Chloride: 104 mmol/L (ref 96–106)
Creatinine, Ser: 1 mg/dL (ref 0.57–1.00)
Globulin, Total: 2.2 g/dL (ref 1.5–4.5)
Glucose: 88 mg/dL (ref 70–99)
Potassium: 4.7 mmol/L (ref 3.5–5.2)
Sodium: 139 mmol/L (ref 134–144)
Total Protein: 6.8 g/dL (ref 6.0–8.5)
eGFR: 64 mL/min/1.73 (ref >59)

## 2024-01-08 LAB — LIPID PANEL
Chol/HDL Ratio: 3.5 ratio (ref 0.0–4.4)
Cholesterol, Total: 210 mg/dL — ABNORMAL HIGH (ref 100–199)
HDL: 60 mg/dL (ref >39)
LDL Chol Calc (NIH): 137 mg/dL — ABNORMAL HIGH (ref 0–99)
Triglycerides: 71 mg/dL (ref 0–149)
VLDL Cholesterol Cal: 13 mg/dL (ref 5–40)

## 2024-01-08 LAB — URINALYSIS, ROUTINE W REFLEX MICROSCOPIC
Bilirubin, UA: NEGATIVE
Glucose, UA: NEGATIVE
Nitrite, UA: NEGATIVE
RBC, UA: NEGATIVE
Specific Gravity, UA: >=1.03 — AB (ref 1.005–1.030)
Urobilinogen, Ur: 1 mg/dL (ref 0.2–1.0)
pH, UA: 6 (ref 5.0–7.5)

## 2024-01-08 LAB — MICROSCOPIC EXAMINATION
Bacteria, UA: NONE SEEN
RBC, Urine: NONE SEEN /HPF (ref 0–2)

## 2024-01-08 LAB — TSH+FREE T4
Free T4: 1.59 ng/dL (ref 0.82–1.77)
TSH: 0.489 u[IU]/mL (ref 0.450–4.500)

## 2024-01-08 LAB — HEMOGLOBIN A1C
Est. average glucose Bld gHb Est-mCnc: 97 mg/dL
Hgb A1c MFr Bld: 5 % (ref 4.8–5.6)

## 2024-01-08 LAB — VITAMIN D 25 HYDROXY (VIT D DEFICIENCY, FRACTURES): Vit D, 25-Hydroxy: 35.7 ng/mL (ref 30.0–100.0)

## 2024-02-04 ENCOUNTER — Ambulatory Visit: Admitting: Podiatry

## 2024-02-04 ENCOUNTER — Ambulatory Visit (INDEPENDENT_AMBULATORY_CARE_PROVIDER_SITE_OTHER)

## 2024-02-04 DIAGNOSIS — S92354D Nondisplaced fracture of fifth metatarsal bone, right foot, subsequent encounter for fracture with routine healing: Secondary | ICD-10-CM | POA: Diagnosis not present

## 2024-02-04 NOTE — Progress Notes (Signed)
 She presents today for follow-up of her Jones fracture fifth metatarsal base of her right foot.  States that she still getting some numbness along the lateral side of the foot states that she came down really hard on the foot when she was trying to put her foot in the boot but missed the boot.  She states otherwise she is been walking on her heel when it is necessary to put the foot down but most of the time she is using her knee scooter.  States that she saw her primary care provider who checked her calcium and her vitamin D3 and they were both normal.  She has her DEXA scan next Monday.  Objective: Vital signs are stable alert oriented x 3 some swelling is still retaining to the right foot but it appears much better much improved from previous evaluation.  Radiographs taken today demonstrate cross bridging of the fracture site which appears to be healing very nicely at this point.  It has made a lot of progress since I saw her at last visit last month.  Assessment: Resolving fracture.  Plan: I will allow her to stay in the boot for another 2 weeks with weightbearing and then if there is any pain she is to immediately get back onto her knee scooter.  Otherwise I will follow-up with her in 1 month for another set of x-rays.

## 2024-02-11 ENCOUNTER — Ambulatory Visit
Admission: RE | Admit: 2024-02-11 | Discharge: 2024-02-11 | Disposition: A | Discharge status: Home / Self Care | Source: Ambulatory Visit | Attending: Internal Medicine | Admitting: Internal Medicine

## 2024-02-11 DIAGNOSIS — E2839 Other primary ovarian failure: Secondary | ICD-10-CM | POA: Insufficient documentation

## 2024-03-12 ENCOUNTER — Ambulatory Visit (INDEPENDENT_AMBULATORY_CARE_PROVIDER_SITE_OTHER)

## 2024-03-12 ENCOUNTER — Encounter: Payer: Self-pay | Admitting: Podiatry

## 2024-03-12 ENCOUNTER — Ambulatory Visit: Admitting: Podiatry

## 2024-03-12 DIAGNOSIS — S92354D Nondisplaced fracture of fifth metatarsal bone, right foot, subsequent encounter for fracture with routine healing: Secondary | ICD-10-CM | POA: Diagnosis not present

## 2024-03-12 NOTE — Progress Notes (Signed)
 She presents today for a follow-up of her Jones fracture fifth metatarsal of her right foot.  She states that it still hurts to walk on and [place] do any damage to it.  She states that she has been walking in her tennis shoes and not doing a whole lot of walking barefooted but some on her carpet at home because it feels better to do that.  Objective: Vital signs are stable she is alert and oriented x 3 she has pitting edema overlying the fifth metatarsal of the right foot which was not present on last exam.  Radiographs taken today demonstrate no significant change in the old fracture site.  She does have severe pain on palpation to this site however.  Assessment: Radiographically it appears that she has good cross bridging of the site but is still very tender.  Plan: At this point unguinal ask for a CT to make sure that we have substantial cross bridging and that there is no new fracture in the area.  I will follow-up with her once this is done.

## 2024-03-14 ENCOUNTER — Ambulatory Visit
Admission: RE | Admit: 2024-03-14 | Discharge: 2024-03-14 | Disposition: A | Discharge status: Home / Self Care | Source: Ambulatory Visit | Attending: Podiatry | Admitting: Podiatry

## 2024-03-14 DIAGNOSIS — S92354D Nondisplaced fracture of fifth metatarsal bone, right foot, subsequent encounter for fracture with routine healing: Secondary | ICD-10-CM

## 2024-03-26 ENCOUNTER — Ambulatory Visit: Admitting: Podiatry

## 2024-03-26 DIAGNOSIS — S99191D Other physeal fracture of right metatarsal, subsequent encounter for fracture with routine healing: Secondary | ICD-10-CM | POA: Diagnosis not present

## 2024-03-26 NOTE — Progress Notes (Signed)
 She presents today for follow-up of her CT scans right foot.  She states that the foot is doing much better and she is walking in her regular tennis shoes.  She states that she is [place] [employer] this [place].  Denies any problem with the lateral aspect of her right ankle and heel.  States that she still has some tenderness on ambulation with the fifth metatarsal.  Objective: Vital signs stable alert oriented x 3 there is no longer any swelling to the right lateral foot.  She has minimal tenderness on palpation of the Jones fracture that has gone on to heal.  CT confirms 100% heel of the bone fracture.  It does demonstrate some peroneal tendinopathy with an enlarged peroneal tubercle however she is not having any pain on palpation of the tendon use of the tendon or on the tubercle.  Assessment: Well-healing fracture fifth metatarsal base right.  Plan: Follow-up with me with any reoccurrence or continued symptomatology.

## 2024-04-01 ENCOUNTER — Ambulatory Visit: Payer: Self-pay | Admitting: Podiatry

## 2024-04-08 ENCOUNTER — Ambulatory Visit: Admitting: Internal Medicine

## 2024-04-08 ENCOUNTER — Encounter: Payer: Self-pay | Admitting: Internal Medicine

## 2024-04-08 ENCOUNTER — Other Ambulatory Visit: Payer: Self-pay | Admitting: Internal Medicine

## 2024-04-08 VITALS — BP 128/80 | HR 81 | Ht 64.0 in | Wt 171.0 lb

## 2024-04-08 DIAGNOSIS — Z1231 Encounter for screening mammogram for malignant neoplasm of breast: Secondary | ICD-10-CM

## 2024-04-08 DIAGNOSIS — Z6831 Body mass index (BMI) 31.0-31.9, adult: Secondary | ICD-10-CM | POA: Diagnosis not present

## 2024-04-08 DIAGNOSIS — I10 Essential (primary) hypertension: Secondary | ICD-10-CM

## 2024-04-08 DIAGNOSIS — E039 Hypothyroidism, unspecified: Secondary | ICD-10-CM

## 2024-04-08 MED ORDER — ENALAPRIL MALEATE 10 MG PO TABS: 10.0000 mg | ORAL_TABLET | Freq: Every day | ORAL | 1 refills | Status: AC

## 2024-04-08 MED ORDER — ZEPBOUND 10 MG/0.5ML ~~LOC~~ SOAJ: 10.0000 mg | SUBCUTANEOUS | 0 refills | Status: DC

## 2024-04-08 MED ORDER — ONDANSETRON HCL 4 MG PO TABS
4.0000 mg | ORAL_TABLET | Freq: Three times a day (TID) | ORAL | 0 refills | Status: AC | PRN
Start: 2024-04-08 — End: ?

## 2024-04-08 MED ORDER — LEVOTHYROXINE SODIUM 88 MCG PO TABS: 88.0000 ug | ORAL_TABLET | Freq: Every day | ORAL | 1 refills | Status: AC

## 2024-04-08 NOTE — Assessment & Plan Note (Addendum)
 Weight loss has stalled on zepbound  7.5 mg. Had nausea on 10 mg dose but willing to try again. Will also give zofran  to take if needed Call in one month for zepbound  refill - pens not vial.

## 2024-04-08 NOTE — Patient Instructions (Signed)
 Call [facility] to schedule your mammogram at [phone].

## 2024-04-08 NOTE — Progress Notes (Signed)
 Date:  04/08/2024   Name:  [name]   DOB:  1960/12/21   MRN:  [id number]   Chief Complaint: Weight Check and Hypertension  Hypertension This is a chronic problem. The problem is controlled. Pertinent negatives include no chest pain, headaches, palpitations or shortness of breath. Past treatments include ACE inhibitors. The current treatment provides significant improvement.  Weight management - started Zepbound  05/2023 with a weight of 185 lbs.  In August she was 168 lbs  today her weight is 171 lbs.  She has not been able to walk much until 2 weeks ago.  When she was on 10 mg she had more gagging with teeth brushing for the first 2 days.  Review of Systems  Constitutional:  Negative for chills, fatigue and fever.  Respiratory:  Negative for chest tightness and shortness of breath.   Cardiovascular:  Negative for chest pain and palpitations.  Neurological:  Negative for dizziness, light-headedness and headaches.  Psychiatric/Behavioral:  Negative for dysphoric mood. The patient is not nervous/anxious.      Lab Results  Component Value Date   NA 139 01/07/2024   K 4.7 01/07/2024   CO2 22 01/07/2024   GLUCOSE 88 01/07/2024   BUN 17 01/07/2024   CREATININE 1.00 01/07/2024   CALCIUM 9.8 01/07/2024   EGFR 64 01/07/2024   GFRNONAA 74 03/03/2020   Lab Results  Component Value Date   CHOL 210 (H) 01/07/2024   HDL 60 01/07/2024   LDLCALC 137 (H) 01/07/2024   TRIG 71 01/07/2024   CHOLHDL 3.5 01/07/2024   Lab Results  Component Value Date   TSH 0.489 01/07/2024   Lab Results  Component Value Date   HGBA1C 5.0 01/07/2024   Lab Results  Component Value Date   WBC 4.5 01/07/2024   HGB 13.4 01/07/2024   HCT 40.5 01/07/2024   MCV 97 01/07/2024   PLT 279 01/07/2024   Lab Results  Component Value Date   ALT 12 01/07/2024   AST 13 01/07/2024   ALKPHOS 87 01/07/2024   BILITOT 0.5 01/07/2024   Lab Results  Component Value Date   VD25OH 35.7 01/07/2024      Patient Active Problem List   Diagnosis Date Noted   Vitamin D  deficiency 01/07/2024   Closed fracture of fifth metatarsal bone 10/19/2023   Mild hyperlipidemia 12/13/2022   Essential hypertension 07/22/2021   Acquired hypothyroidism 07/22/2021   Arthralgia of both ankles 08/13/2018   Restless leg syndrome 06/13/2017   Iron deficiency 06/13/2017   Varicose veins of left lower extremity with inflammation 06/13/2017   Bilateral hand numbness 03/20/2013   Psoriasis 03/20/2013   Chronic low back pain 03/20/2013   BMI 31.0-31.9,adult 07/22/2012    No Known Allergies  Past Surgical History:  Procedure Laterality Date   ABDOMINAL HYSTERECTOMY  2007   endometriosis, Dr. Kathe   COLONOSCOPY WITH PROPOFOL  N/A 10/31/2019   Procedure: COLONOSCOPY WITH PROPOFOL ;  Surgeon: Jinny Carmine, MD;  Location: [facility];  Service: Endoscopy;  Laterality: N/A;  priority 4   VASCULAR SURGERY Left    left leg    Social History   Tobacco Use   Smoking status: Former    Current packs/day: 0.00    Types: Cigarettes    Quit date: 2008    Years since quitting: 17.9   Smokeless tobacco: Never  Vaping Use   Vaping status: Never Used  Substance Use Topics   Alcohol use: Yes    Comment: occasional   Drug  use: No     Medication list has been reviewed and updated.  Current Meds  Medication Sig   calcium citrate (CALCITRATE - DOSED IN MG ELEMENTAL CALCIUM) 950 (200 Ca) MG tablet Take 200 mg of elemental calcium by mouth daily.   MAGNESIUM PO Take by mouth. (Patient taking differently: Take 400 mg by mouth.)   ondansetron  (ZOFRAN ) 4 MG tablet Take 1 tablet (4 mg total) by mouth every 8 (eight) hours as needed for nausea or vomiting.   rOPINIRole  (REQUIP ) 0.5 MG tablet Take one tab po qhs for restless legs   tirzepatide  (ZEPBOUND ) 10 MG/0.5ML Pen Inject 10 mg into the skin once a week.   VITAMIN D  PO Take 5,000 Units by mouth.   [DISCONTINUED] enalapril  (VASOTEC ) 10 MG tablet Take  1 tablet (10 mg total) by mouth daily.   [DISCONTINUED] levothyroxine  (SYNTHROID ) 88 MCG tablet Take 1 tablet (88 mcg total) by mouth daily.   [DISCONTINUED] tirzepatide  7.5 MG/0.5ML injection vial Inject 7.5 mg into the skin once a week.   [DISCONTINUED] Vitamin D , Ergocalciferol , (DRISDOL) 1.25 MG (50000 UNIT) CAPS capsule Take 50,000 Units by mouth every 7 (seven) days.   [DISCONTINUED] ZEPBOUND  7.5 MG/0.5ML Pen SMARTSIG:7.5 Milligram(s) SUB-Q Once a Week       04/08/2024    3:57 PM 09/12/2023    4:04 PM 06/20/2023   11:06 AM 12/13/2022    9:46 AM  GAD 7 : Generalized Anxiety Score  Nervous, Anxious, on Edge 0 0 0 0  Control/stop worrying 0 0 0 0  Worry too much - different things 0 0 0 0  Trouble relaxing 0 0 0 0  Restless 0 0 0 0  Easily annoyed or irritable 0 0 0 0  Afraid - awful might happen 0 0 0 0  Total GAD 7 Score 0 0 0 0  Anxiety Difficulty Not difficult at all Not difficult at all Not difficult at all Not difficult at all       04/08/2024    3:57 PM 01/07/2024   10:50 AM 09/12/2023    4:02 PM  Depression screen PHQ 2/9  Decreased Interest 0 0 1  Down, Depressed, Hopeless 0 0 0  PHQ - 2 Score 0 0 1  Altered sleeping  1 2  Tired, decreased energy  1 2  Change in appetite  0 0  Feeling bad or failure about yourself   0 0  Trouble concentrating  0 0  Moving slowly or fidgety/restless  0 0  Suicidal thoughts  0 0  PHQ-9 Score  2  5   Difficult doing work/chores  Not difficult at all Not difficult at all     Data saved with a previous flowsheet row definition    BP Readings from Last 3 Encounters:  04/08/24 128/80  01/07/24 122/70  09/12/23 112/74    Physical Exam Vitals and nursing note reviewed.  Constitutional:      General: She is not in acute distress.    Appearance: Normal appearance. She is well-developed.  HENT:     Head: Normocephalic and atraumatic.  Cardiovascular:     Rate and Rhythm: Normal rate and regular rhythm.     Heart sounds: No  murmur heard. Pulmonary:     Effort: Pulmonary effort is normal. No respiratory distress.     Breath sounds: No wheezing or rhonchi.  Abdominal:     General: Bowel sounds are normal.     Palpations: Abdomen is soft.     Tenderness:  There is no abdominal tenderness.  Musculoskeletal:     Cervical back: Normal range of motion.     Right lower leg: No edema.     Left lower leg: No edema.  Lymphadenopathy:     Cervical: No cervical adenopathy.  Skin:    General: Skin is warm and dry.     Findings: No rash.  Neurological:     Mental Status: She is alert and oriented to person, place, and time.  Psychiatric:        Mood and Affect: Mood normal.        Behavior: Behavior normal.     Wt Readings from Last 3 Encounters:  04/08/24 171 lb (77.6 kg)  01/07/24 168 lb (76.2 kg)  09/12/23 179 lb 6 oz (81.4 kg)    BP 128/80   Pulse 81   Ht 5' 4 (1.626 m)   Wt 171 lb (77.6 kg)   SpO2 99%   BMI 29.35 kg/m   Assessment and Plan:  Problem List Items Addressed This Visit       Unprioritized   BMI 31.0-31.9,adult (Chronic)   Weight loss has stalled on zepbound  7.5 mg. Had nausea on 10 mg dose but willing to try again. Will also give zofran  to take if needed Call in one month for zepbound  refill - pens not vial.       Relevant Medications   tirzepatide  (ZEPBOUND ) 10 MG/0.5ML Pen   ondansetron  (ZOFRAN ) 4 MG tablet   Essential hypertension - Primary (Chronic)   Well controlled blood pressure today. Current regimen is enalapril . No medication side effects noted.        Relevant Medications   enalapril  (VASOTEC ) 10 MG tablet   Acquired hypothyroidism (Chronic)   Relevant Medications   levothyroxine  (SYNTHROID ) 88 MCG tablet   Other Visit Diagnoses       Encounter for screening mammogram for breast cancer       Relevant Orders   MM 3D SCREENING MAMMOGRAM BILATERAL BREAST       No follow-ups on file.    [name], MD [facility] [place]

## 2024-04-08 NOTE — Assessment & Plan Note (Signed)
 Well controlled blood pressure today. Current regimen is enalapril . No medication side effects noted.

## 2024-04-09 NOTE — Telephone Encounter (Signed)
 Discontinued 04/08/24, patient preference.  Requested Prescriptions  Pending Prescriptions Disp Refills   ZEPBOUND  7.5 MG/0.5ML Pen [Pharmacy Med Name: ZEPBOUND  7.5MG /0.5ML INJ(4PF PENS)] 6 mL     Sig: ADMINISTER 7.5 MG UNDER THE SKIN 1 TIME A WEEK     Off-Protocol Failed - 04/09/2024  9:04 AM      Failed - Medication not assigned to a protocol, review manually.      Passed - Valid encounter within last 12 months    Recent Outpatient Visits           Yesterday Essential hypertension   [place] [facility] at [facility], Leita DEL, MD   3 months ago Annual physical exam   [facility] at [facility], Leita DEL, MD   7 months ago Essential hypertension   [facility] at [facility], Leita DEL, MD   9 months ago Essential hypertension   [facility] at [facility], Leita DEL, MD

## 2024-04-14 ENCOUNTER — Ambulatory Visit: Admitting: Podiatry

## 2024-04-29 ENCOUNTER — Encounter: Payer: Self-pay | Admitting: Internal Medicine

## 2024-04-29 ENCOUNTER — Other Ambulatory Visit: Payer: Self-pay | Admitting: Internal Medicine

## 2024-04-29 DIAGNOSIS — Z6831 Body mass index (BMI) 31.0-31.9, adult: Secondary | ICD-10-CM

## 2024-04-29 MED ORDER — ZEPBOUND 10 MG/0.5ML ~~LOC~~ SOAJ: 10.0000 mg | SUBCUTANEOUS | 0 refills | Status: AC

## 2024-04-29 NOTE — Telephone Encounter (Signed)
 Please review and advise. Thank you  JM

## 2024-04-29 NOTE — Progress Notes (Unsigned)
 Date:  04/29/2024   Name:  [name]   DOB:  [birth year]   MRN:  [id number]   Chief Complaint: No chief complaint on file.  HPI  Review of Systems   Lab Results  Component Value Date   NA 139 01/07/2024   K 4.7 01/07/2024   CO2 22 01/07/2024   GLUCOSE 88 01/07/2024   BUN 17 01/07/2024   CREATININE 1.00 01/07/2024   CALCIUM 9.8 01/07/2024   EGFR 64 01/07/2024   GFRNONAA 74 03/03/2020   Lab Results  Component Value Date   CHOL 210 (H) 01/07/2024   HDL 60 01/07/2024   LDLCALC 137 (H) 01/07/2024   TRIG 71 01/07/2024   CHOLHDL 3.5 01/07/2024   Lab Results  Component Value Date   TSH 0.489 01/07/2024   Lab Results  Component Value Date   HGBA1C 5.0 01/07/2024   Lab Results  Component Value Date   WBC 4.5 01/07/2024   HGB 13.4 01/07/2024   HCT 40.5 01/07/2024   MCV 97 01/07/2024   PLT 279 01/07/2024   Lab Results  Component Value Date   ALT 12 01/07/2024   AST 13 01/07/2024   ALKPHOS 87 01/07/2024   BILITOT 0.5 01/07/2024   Lab Results  Component Value Date   VD25OH 35.7 01/07/2024     Patient Active Problem List   Diagnosis Date Noted   Vitamin D  deficiency 01/07/2024   Closed fracture of fifth metatarsal bone 10/19/2023   Mild hyperlipidemia 12/13/2022   Essential hypertension 07/22/2021   Acquired hypothyroidism 07/22/2021   Arthralgia of both ankles 08/13/2018   Restless leg syndrome 06/13/2017   Iron deficiency 06/13/2017   Varicose veins of left lower extremity with inflammation 06/13/2017   Bilateral hand numbness 03/20/2013   Psoriasis 03/20/2013   Chronic low back pain 03/20/2013   BMI 31.0-31.9,adult 07/22/2012    Allergies[1]  Past Surgical History:  Procedure Laterality Date   ABDOMINAL HYSTERECTOMY  2007   endometriosis, Dr. Kathe   COLONOSCOPY WITH PROPOFOL  N/A 10/31/2019   Procedure: COLONOSCOPY WITH PROPOFOL ;  Surgeon: Jinny Carmine, MD;  Location: [facility];  Service: Endoscopy;  Laterality: N/A;   priority 4   VASCULAR SURGERY Left    left leg    Social History[2]   Medication list has been reviewed and updated.  Active Medications[3]     04/08/2024    3:57 PM 09/12/2023    4:04 PM 06/20/2023   11:06 AM 12/13/2022    9:46 AM  GAD 7 : Generalized Anxiety Score  Nervous, Anxious, on Edge 0 0 0 0  Control/stop worrying 0 0 0 0  Worry too much - different things 0 0 0 0  Trouble relaxing 0 0 0 0  Restless 0 0 0 0  Easily annoyed or irritable 0 0 0 0  Afraid - awful might happen 0 0 0 0  Total GAD 7 Score 0 0 0 0  Anxiety Difficulty Not difficult at all Not difficult at all Not difficult at all Not difficult at all       04/08/2024    3:57 PM 01/07/2024   10:50 AM 09/12/2023    4:02 PM  Depression screen PHQ 2/9  Decreased Interest 0 0 1  Down, Depressed, Hopeless 0 0 0  PHQ - 2 Score 0 0 1  Altered sleeping  1 2  Tired, decreased energy  1 2  Change in appetite  0 0  Feeling bad or failure about yourself  0 0  Trouble concentrating  0 0  Moving slowly or fidgety/restless  0 0  Suicidal thoughts  0 0  PHQ-9 Score  2  5   Difficult doing work/chores  Not difficult at all Not difficult at all     Data saved with a previous flowsheet row definition    BP Readings from Last 3 Encounters:  04/08/24 128/80  01/07/24 122/70  09/12/23 112/74    Physical Exam  Wt Readings from Last 3 Encounters:  04/08/24 171 lb (77.6 kg)  01/07/24 168 lb (76.2 kg)  09/12/23 179 lb 6 oz (81.4 kg)    There were no vitals taken for this visit.  Assessment and Plan:  Problem List Items Addressed This Visit   None   No follow-ups on file.    [name], MD [facility] [place]           [1] No Known Allergies [2]  Social History Tobacco Use   Smoking status: Former    Current packs/day: 0.00    Types: Cigarettes    Quit date: 2008    Years since quitting: 17.9   Smokeless tobacco: Never  Vaping Use   Vaping  status: Never Used  Substance Use Topics   Alcohol use: Yes    Comment: occasional   Drug use: No  [3]  No outpatient medications have been marked as taking for the 04/29/24 encounter (Orders Only) with Adie Leita DEL, MD.

## 2024-07-07 ENCOUNTER — Ambulatory Visit: Admitting: Student
